# Patient Record
Sex: Male | Born: 1985 | Race: Black or African American | Hispanic: No | Marital: Single | Smoking: Never smoker
Health system: Southern US, Community
[De-identification: ages and names within clinical notes are randomized; demographics above are authoritative.]

## PROBLEM LIST (undated history)

## (undated) DIAGNOSIS — I509 Heart failure, unspecified: Secondary | ICD-10-CM

## (undated) DIAGNOSIS — G4730 Sleep apnea, unspecified: Secondary | ICD-10-CM

## (undated) DIAGNOSIS — I10 Essential (primary) hypertension: Secondary | ICD-10-CM

## (undated) DIAGNOSIS — R519 Headache, unspecified: Secondary | ICD-10-CM

## (undated) HISTORY — PX: WISDOM TOOTH EXTRACTION: SHX21

## (undated) HISTORY — DX: Sleep apnea, unspecified: G47.30

## (undated) HISTORY — PX: TONSILLECTOMY: SUR1361

## (undated) HISTORY — DX: Headache, unspecified: R51.9

## (undated) HISTORY — DX: Heart failure, unspecified: I50.9

---
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
## 2006-05-18 ENCOUNTER — Emergency Department: Payer: Self-pay | Admitting: Emergency Medicine

## 2006-05-18 ENCOUNTER — Other Ambulatory Visit: Payer: Self-pay

## 2006-12-05 ENCOUNTER — Emergency Department: Payer: Self-pay | Admitting: Emergency Medicine

## 2006-12-05 ENCOUNTER — Other Ambulatory Visit: Payer: Self-pay

## 2008-01-24 ENCOUNTER — Emergency Department: Payer: Self-pay | Admitting: Emergency Medicine

## 2008-06-18 ENCOUNTER — Emergency Department: Payer: Self-pay | Admitting: Emergency Medicine

## 2008-09-11 ENCOUNTER — Emergency Department: Payer: Self-pay | Admitting: Emergency Medicine

## 2008-11-26 ENCOUNTER — Emergency Department: Payer: Self-pay | Admitting: Emergency Medicine

## 2009-06-18 ENCOUNTER — Emergency Department: Payer: Self-pay | Admitting: Emergency Medicine

## 2009-11-17 ENCOUNTER — Emergency Department: Payer: Self-pay | Admitting: Emergency Medicine

## 2009-12-03 ENCOUNTER — Ambulatory Visit: Payer: Self-pay | Admitting: Otolaryngology

## 2010-01-17 ENCOUNTER — Ambulatory Visit: Payer: Self-pay | Admitting: Otolaryngology

## 2010-02-05 ENCOUNTER — Ambulatory Visit: Payer: Self-pay | Admitting: Otolaryngology

## 2010-02-07 LAB — PATHOLOGY REPORT

## 2010-06-07 ENCOUNTER — Emergency Department: Payer: Self-pay | Admitting: Emergency Medicine

## 2011-02-04 ENCOUNTER — Emergency Department: Payer: Self-pay | Admitting: *Deleted

## 2011-09-08 ENCOUNTER — Emergency Department: Payer: Self-pay | Admitting: Emergency Medicine

## 2011-09-08 LAB — TROPONIN I
Troponin-I: <0.02 ng/mL
Troponin-I: <0.02 ng/mL

## 2011-09-08 LAB — BASIC METABOLIC PANEL
Co2: 27 mmol/L (ref 21–32)
EGFR (African American): >60
EGFR (Non-African Amer.): >60
Glucose: 93 mg/dL (ref 65–99)
Potassium: 3.7 mmol/L (ref 3.5–5.1)
Sodium: 140 mmol/L (ref 136–145)

## 2011-09-08 LAB — CBC
MCH: 28.2 pg (ref 26.0–34.0)
MCV: 85 fL (ref 80–100)
RBC: 5.26 10*6/uL (ref 4.40–5.90)
RDW: 14.8 % — ABNORMAL HIGH (ref 11.5–14.5)

## 2013-06-10 ENCOUNTER — Emergency Department: Payer: Self-pay | Admitting: Emergency Medicine

## 2013-06-12 ENCOUNTER — Emergency Department: Payer: Self-pay | Admitting: Emergency Medicine

## 2014-01-18 ENCOUNTER — Emergency Department: Payer: Self-pay | Admitting: Emergency Medicine

## 2014-01-18 LAB — CBC WITH DIFFERENTIAL/PLATELET
BASOS PCT: 0.8 %
Basophil #: 0.1 10*3/uL (ref 0.0–0.1)
EOS PCT: 0.9 %
Eosinophil #: 0.1 10*3/uL (ref 0.0–0.7)
HCT: 48.2 % (ref 40.0–52.0)
HGB: 15.6 g/dL (ref 13.0–18.0)
Lymphocyte #: 3.6 10*3/uL (ref 1.0–3.6)
Lymphocyte %: 38.6 %
MCH: 27.1 pg (ref 26.0–34.0)
MCHC: 32.3 g/dL (ref 32.0–36.0)
MCV: 84 fL (ref 80–100)
MONO ABS: 0.6 x10 3/mm (ref 0.2–1.0)
MONOS PCT: 6.4 %
NEUTROS ABS: 5 10*3/uL (ref 1.4–6.5)
NEUTROS PCT: 53.3 %
Platelet: 167 10*3/uL (ref 150–440)
RBC: 5.74 10*6/uL (ref 4.40–5.90)
RDW: 14.8 % — ABNORMAL HIGH (ref 11.5–14.5)
WBC: 9.3 10*3/uL (ref 3.8–10.6)

## 2014-01-18 LAB — BASIC METABOLIC PANEL
Anion Gap: 9 (ref 7–16)
BUN: 12 mg/dL (ref 7–18)
Calcium, Total: 8.7 mg/dL (ref 8.5–10.1)
Chloride: 106 mmol/L (ref 98–107)
Co2: 26 mmol/L (ref 21–32)
Creatinine: 1.19 mg/dL (ref 0.60–1.30)
EGFR (African American): >60
EGFR (Non-African Amer.): >60
GLUCOSE: 86 mg/dL (ref 65–99)
OSMOLALITY: 280 (ref 275–301)
Potassium: 3.7 mmol/L (ref 3.5–5.1)
Sodium: 141 mmol/L (ref 136–145)

## 2014-10-07 ENCOUNTER — Emergency Department
Admit: 2014-10-07 | Disposition: A | Discharge status: Home / Self Care | Payer: Self-pay | Admitting: Emergency Medicine

## 2015-03-07 ENCOUNTER — Ambulatory Visit
Admission: EM | Admit: 2015-03-07 | Discharge: 2015-03-07 | Disposition: A | Discharge status: Home / Self Care | Payer: 59 | Attending: Family Medicine | Admitting: Family Medicine

## 2015-03-07 ENCOUNTER — Encounter: Payer: Self-pay | Admitting: Emergency Medicine

## 2015-03-07 DIAGNOSIS — K529 Noninfective gastroenteritis and colitis, unspecified: Secondary | ICD-10-CM | POA: Diagnosis not present

## 2015-03-07 HISTORY — DX: Essential (primary) hypertension: I10

## 2015-03-07 LAB — COMPREHENSIVE METABOLIC PANEL
ALT: 68 U/L — AB (ref 17–63)
AST: 37 U/L (ref 15–41)
Albumin: 4.3 g/dL (ref 3.5–5.0)
Alkaline Phosphatase: 46 U/L (ref 38–126)
Anion gap: 12 (ref 5–15)
BILIRUBIN TOTAL: 0.8 mg/dL (ref 0.3–1.2)
BUN: 13 mg/dL (ref 6–20)
CHLORIDE: 100 mmol/L — AB (ref 101–111)
CO2: 27 mmol/L (ref 22–32)
CREATININE: 1.13 mg/dL (ref 0.61–1.24)
Calcium: 8.8 mg/dL — ABNORMAL LOW (ref 8.9–10.3)
Glucose, Bld: 107 mg/dL — ABNORMAL HIGH (ref 65–99)
Potassium: 3.9 mmol/L (ref 3.5–5.1)
Sodium: 139 mmol/L (ref 135–145)
TOTAL PROTEIN: 7.8 g/dL (ref 6.5–8.1)

## 2015-03-07 LAB — URINALYSIS COMPLETE WITH MICROSCOPIC (ARMC ONLY)
Bacteria, UA: NONE SEEN — AB
Bilirubin Urine: NEGATIVE
Glucose, UA: NEGATIVE mg/dL
HGB URINE DIPSTICK: NEGATIVE
Ketones, ur: NEGATIVE mg/dL
Leukocytes, UA: NEGATIVE
NITRITE: NEGATIVE
PH: 6 (ref 5.0–8.0)
PROTEIN: NEGATIVE mg/dL
Specific Gravity, Urine: 1.02 (ref 1.005–1.030)

## 2015-03-07 LAB — CBC WITH DIFFERENTIAL/PLATELET
BASOS ABS: 0.1 10*3/uL (ref 0–0.1)
BASOS PCT: 1 %
EOS ABS: 0.1 10*3/uL (ref 0–0.7)
EOS PCT: 1 %
HCT: 46.7 % (ref 40.0–52.0)
HEMOGLOBIN: 15.6 g/dL (ref 13.0–18.0)
LYMPHS ABS: 1.3 10*3/uL (ref 1.0–3.6)
Lymphocytes Relative: 14 %
MCH: 27.6 pg (ref 26.0–34.0)
MCHC: 33.5 g/dL (ref 32.0–36.0)
MCV: 82.6 fL (ref 80.0–100.0)
Monocytes Absolute: 0.4 10*3/uL (ref 0.2–1.0)
Monocytes Relative: 4 %
NEUTROS PCT: 80 %
Neutro Abs: 7.1 10*3/uL — ABNORMAL HIGH (ref 1.4–6.5)
PLATELETS: 172 10*3/uL (ref 150–440)
RBC: 5.66 MIL/uL (ref 4.40–5.90)
RDW: 15.1 % — ABNORMAL HIGH (ref 11.5–14.5)
WBC: 8.9 10*3/uL (ref 3.8–10.6)

## 2015-03-07 MED ORDER — ONDANSETRON 8 MG PO TBDP: 8.0000 mg | ORAL_TABLET | Freq: Two times a day (BID) | ORAL | Status: DC

## 2015-03-07 MED ORDER — ONDANSETRON 8 MG PO TBDP
8.0000 mg | ORAL_TABLET | Freq: Once | ORAL | Status: AC
  Administered 2015-03-07: 8 mg via ORAL

## 2015-03-07 NOTE — ED Notes (Signed)
Patient c/o left sided abdominal pain that that started this morning. Patient reports vomiting and diarrhea.  Patient also reports right shoulder pain for a week.  Patient denies fevers.

## 2015-03-07 NOTE — Discharge Instructions (Signed)

## 2015-03-07 NOTE — ED Provider Notes (Signed)
CSN: [id number]     Arrival date & time 03/07/15  1059 History   First MD Initiated Contact with Patient 03/07/15 1158     Chief Complaint  Patient presents with  . Abdominal Pain  . Shoulder Pain    right   (Consider location/radiation/quality/duration/timing/severity/associated sxs/prior Treatment) HPI Comments: 29 yo male with a h/o acute left sided abdominal pain that started this morning associated with vomiting and loose stools. Denies any fevers, chills, melena, hematochezia. States he ate out fast food last night for dinner.  The history is provided by the patient.    Past Medical History  Diagnosis Date  . Hypertension    Past Surgical History  Procedure Laterality Date  . Tonsillectomy     History reviewed. No pertinent family history. Social History  Substance Use Topics  . Smoking status: Never Smoker   . Smokeless tobacco: None  . Alcohol Use: No    Review of Systems  Allergies  Review of patient's allergies indicates no known allergies.  Home Medications   Prior to Admission medications   Medication Sig Start Date End Date Taking? Authorizing Provider  lisinopril (PRINIVIL,ZESTRIL) 20 MG tablet Take 20 mg by mouth daily.   Yes Historical Provider, MD  ondansetron (ZOFRAN ODT) 8 MG disintegrating tablet Take 1 tablet (8 mg total) by mouth 2 (two) times daily. 03/07/15   Payton Mccallum, MD   Meds Ordered and Administered this Visit   Medications  ondansetron (ZOFRAN-ODT) disintegrating tablet 8 mg (8 mg Oral Given 03/07/15 1221)    BP 159/97 mmHg  Pulse 105  Temp(Src) 100.5 F (38.1 C) (Tympanic)  Resp 16  Ht  (1.727 m)  Wt 265 lb (120.203 kg)  BMI 40.30 kg/m2  SpO2 97% No data found.   Physical Exam  Constitutional: He appears well-developed and well-nourished. No distress.  HENT:  Head: Normocephalic.  Right Ear: Tympanic membrane and ear canal normal.  Left Ear: Tympanic membrane and ear canal normal.  Mouth/Throat: Uvula is midline  and mucous membranes are normal. No tonsillar abscesses.  Eyes: Conjunctivae are normal. No scleral icterus.  Neck: Normal range of motion. Neck supple. No tracheal deviation present. No thyromegaly present.  Cardiovascular: Normal rate, regular rhythm and normal heart sounds.   Pulmonary/Chest: Effort normal and breath sounds normal. No stridor. No respiratory distress. He has no wheezes. He has no rales. He exhibits no tenderness.  Abdominal: He exhibits no distension and no mass. There is tenderness (mild diffuse lower abdominal pain (left greater than right); no distenttion, rebound or guarding; abdomen soft). There is no rebound and no guarding.  Lymphadenopathy:    He has no cervical adenopathy.  Neurological: He is alert.  Skin: Skin is warm and dry. No rash noted. He is not diaphoretic.  Nursing note and vitals reviewed.   ED Course  Procedures (including critical care time)  Labs Review Labs Reviewed  URINALYSIS COMPLETEWITH MICROSCOPIC ([facility] ONLY) - Abnormal; Notable for the following:    Bacteria, UA NONE SEEN (*)    Squamous Epithelial / LPF 0-5 (*)    All other components within normal limits  COMPREHENSIVE METABOLIC PANEL - Abnormal; Notable for the following:    Chloride 100 (*)    Glucose, Bld 107 (*)    Calcium 8.8 (*)    ALT 68 (*)    All other components within normal limits  CBC WITH DIFFERENTIAL/PLATELET - Abnormal; Notable for the following:    RDW 15.1 (*)    Neutro Abs  7.1 (*)    All other components within normal limits    Imaging Review No results found.   Visual Acuity Review  Right Eye Distance:   Left Eye Distance:   Bilateral Distance:    Right Eye Near:   Left Eye Near:    Bilateral Near:         MDM   1. Acute gastroenteritis    Discharge Medication List as of 03/07/2015  1:48 PM    START taking these medications   Details  ondansetron (ZOFRAN ODT) 8 MG disintegrating tablet Take 1 tablet (8 mg total) by mouth 2 (two) times  daily., Starting 03/07/2015, Until Discontinued, Normal      Plan: 1. Test results (UA, CBC, CMP) and diagnosis reviewed with patient 2. Patient given zofran with improvement of symptoms 3.  rx as per orders; risks, benefits, potential side effects reviewed with patient 4. Recommend supportive treatment with clear liquid diet then advance slowly as tolerated; otc probiotics  5. F/u prn if symptoms worsen or don't improve    Payton Mccallum, MD 03/07/15 1427

## 2015-03-09 ENCOUNTER — Ambulatory Visit
Admit: 2015-03-09 | Discharge: 2015-03-09 | Disposition: A | Discharge status: Home / Self Care | Payer: 59 | Attending: *Deleted | Admitting: *Deleted

## 2015-03-09 ENCOUNTER — Encounter: Payer: Self-pay | Admitting: *Deleted

## 2015-03-09 ENCOUNTER — Ambulatory Visit
Admission: EM | Admit: 2015-03-09 | Discharge: 2015-03-09 | Disposition: A | Discharge status: Home / Self Care | Payer: 59 | Attending: Family Medicine | Admitting: Family Medicine

## 2015-03-09 DIAGNOSIS — R197 Diarrhea, unspecified: Secondary | ICD-10-CM

## 2015-03-09 DIAGNOSIS — I880 Nonspecific mesenteric lymphadenitis: Secondary | ICD-10-CM | POA: Diagnosis not present

## 2015-03-09 DIAGNOSIS — R599 Enlarged lymph nodes, unspecified: Secondary | ICD-10-CM | POA: Diagnosis not present

## 2015-03-09 DIAGNOSIS — R112 Nausea with vomiting, unspecified: Secondary | ICD-10-CM | POA: Diagnosis not present

## 2015-03-09 DIAGNOSIS — R1031 Right lower quadrant pain: Secondary | ICD-10-CM | POA: Diagnosis not present

## 2015-03-09 LAB — CBC WITH DIFFERENTIAL/PLATELET
BASOS PCT: 1 %
Basophils Absolute: 0 10*3/uL (ref 0–0.1)
Eosinophils Absolute: 0 10*3/uL (ref 0–0.7)
Eosinophils Relative: 0 %
HEMATOCRIT: 47.7 % (ref 40.0–52.0)
HEMOGLOBIN: 15.9 g/dL (ref 13.0–18.0)
LYMPHS ABS: 1.7 10*3/uL (ref 1.0–3.6)
Lymphocytes Relative: 22 %
MCH: 27.3 pg (ref 26.0–34.0)
MCHC: 33.3 g/dL (ref 32.0–36.0)
MCV: 82 fL (ref 80.0–100.0)
MONOS PCT: 9 %
Monocytes Absolute: 0.7 10*3/uL (ref 0.2–1.0)
NEUTROS ABS: 5.1 10*3/uL (ref 1.4–6.5)
NEUTROS PCT: 68 %
Platelets: 164 10*3/uL (ref 150–440)
RBC: 5.82 MIL/uL (ref 4.40–5.90)
RDW: 14.9 % — ABNORMAL HIGH (ref 11.5–14.5)
WBC: 7.5 10*3/uL (ref 3.8–10.6)

## 2015-03-09 LAB — URINALYSIS COMPLETE WITH MICROSCOPIC (ARMC ONLY)
Glucose, UA: NEGATIVE mg/dL
Hgb urine dipstick: NEGATIVE
Ketones, ur: NEGATIVE mg/dL
Leukocytes, UA: NEGATIVE
NITRITE: NEGATIVE
PH: 6 (ref 5.0–8.0)
PROTEIN: 30 mg/dL — AB
Specific Gravity, Urine: >1.03 — ABNORMAL HIGH (ref 1.005–1.030)

## 2015-03-09 LAB — COMPREHENSIVE METABOLIC PANEL
ALBUMIN: 4.4 g/dL (ref 3.5–5.0)
ALK PHOS: 49 U/L (ref 38–126)
ALT: 84 U/L — AB (ref 17–63)
ANION GAP: 12 (ref 5–15)
AST: 43 U/L — ABNORMAL HIGH (ref 15–41)
BILIRUBIN TOTAL: 0.7 mg/dL (ref 0.3–1.2)
BUN: 16 mg/dL (ref 6–20)
CALCIUM: 8.9 mg/dL (ref 8.9–10.3)
CO2: 23 mmol/L (ref 22–32)
CREATININE: 1.34 mg/dL — AB (ref 0.61–1.24)
Chloride: 102 mmol/L (ref 101–111)
GFR calc Af Amer: >60 mL/min (ref >60)
GFR calc non Af Amer: >60 mL/min (ref >60)
GLUCOSE: 101 mg/dL — AB (ref 65–99)
Potassium: 3.5 mmol/L (ref 3.5–5.1)
SODIUM: 137 mmol/L (ref 135–145)
TOTAL PROTEIN: 8.2 g/dL — AB (ref 6.5–8.1)

## 2015-03-09 LAB — LIPASE, BLOOD: Lipase: 15 U/L — ABNORMAL LOW (ref 22–51)

## 2015-03-09 MED ORDER — SODIUM CHLORIDE 0.9 % IV SOLN
Freq: Once | INTRAVENOUS | Status: AC
  Administered 2015-03-09: 13:00:00 via INTRAVENOUS

## 2015-03-09 MED ORDER — IOHEXOL 300 MG/ML  SOLN
100.0000 mL | Freq: Once | INTRAMUSCULAR | Status: AC | PRN
Start: 2015-03-09 — End: 2015-03-09
  Administered 2015-03-09: 100 mL via INTRAVENOUS

## 2015-03-09 MED ORDER — OXYCODONE-ACETAMINOPHEN 5-325 MG PO TABS: 1.0000 | ORAL_TABLET | Freq: Three times a day (TID) | ORAL | Status: DC | PRN

## 2015-03-09 MED ORDER — CIPROFLOXACIN HCL 500 MG PO TABS: 500.0000 mg | ORAL_TABLET | Freq: Two times a day (BID) | ORAL | Status: DC

## 2015-03-09 MED ORDER — ONDANSETRON 4 MG PO TBDP: 4.0000 mg | ORAL_TABLET | Freq: Three times a day (TID) | ORAL | Status: DC | PRN

## 2015-03-09 NOTE — ED Notes (Signed)
Patient has returned to the urgent care to get a 7 day work release note due to his inability to stand for long periods of time. Patient still has symptoms of lower abdominal pain radiating to right flank, diarrhea, and painful urination.

## 2015-03-09 NOTE — ED Provider Notes (Signed)
[facility] Emergency Department Provider Note  ____________________________________________  Time seen: Approximately 11:07 AM  I have reviewed the triage vital signs and the nursing notes.   HISTORY  Chief Complaint Follow-up; Abdominal Pain; Diarrhea; and Urinary Retention   HPI Benjamin Goodman. is a 29 y.o. male patient presents the urgent care for complaints of abdominal pain. Patient reports that he was seen in urgent care for the same complaint 2 days ago but states abdominal pain is worsened. States initially abdominal pain was on the left lower area and then radiated to the right but now reports that the pain is primarily right lower quadrant. States current abdominal pain is 7 out of 10.  Patient states he is still having some pain in the left lower abdomen but mostly right lower. As well as mild pain and right upper abdomen. Reports that he is continue to have diarrhea for the last 2 days. States diarrhea approximately 5-6 episodes a day. States that he has had nausea as well as 2 episodes of vomiting yesterday. Denies other vomiting. States he continues to drink fluids but states he has not eaten as he does not feel like eating as well as scared that he may vomit. States that he has drank at least 1 bottles of water today.Denies fever in the last 2 days. Denies back pain. Patient reports some pain this morning when he went to urinate, and states he feels like it was painful because moving his right leg increased abdominal pain. States once abdominal pain calmed down he was able to easily urinate.States since urinating well. Denies urinary hesitancy, urgency, stream changes or pain during urination.  Denies blood in stool or stool color changes or black in stool.  Patient states he also noticed today that it hurts to lift his right leg in his abdomen.  Patient does report that he works daily and has to frequently lift items but denies recent injury or  fall. Denies abdominal trauma. Denies neck or back pain. Denies penile pain, testicular pain, penile drainage or dysuria.   Past medical history is positive for hypertension much patient is taking lisinopril and hydrochlorothiazide. Denies other home medications. Denies his medication changes. Denies recent cough cold congestion symptoms.   Past Medical History  Diagnosis Date  . Hypertension     There are no active problems to display for this patient.   Past Surgical History  Procedure Laterality Date  . Tonsillectomy      Current Outpatient Rx  Name  Route  Sig  Dispense  Refill  . lisinopril (PRINIVIL,ZESTRIL) 20 MG tablet   Oral   Take 20 mg by mouth daily.         . ondansetron (ZOFRAN ODT) 8 MG disintegrating tablet   Oral   Take 1 tablet (8 mg total) by mouth 2 (two) times daily.   6 tablet   0   .           Marland Kitchen             Allergies Review of patient's allergies indicates no known allergies.  History reviewed. No pertinent family history.  Social History Social History  Substance Use Topics  . Smoking status: Never Smoker   . Smokeless tobacco: None  . Alcohol Use: No    Review of Systems Constitutional: No fever/chills Eyes: No visual changes. ENT: No sore throat. Cardiovascular: Denies chest pain. Respiratory: Denies shortness of breath. Gastrointestinal: Positive for abdominal pain, nausea, vomiting, diarrhea as  above. No constipation. Genitourinary: Negative for dysuria. Musculoskeletal: Negative for back pain. Skin: Negative for rash. Neurological: Negative for headaches, focal weakness or numbness.  10-point ROS otherwise negative.  ____________________________________________   PHYSICAL EXAM:  VITAL SIGNS: ED Triage Vitals  Enc Vitals Group     BP 03/09/15 1033 157/106 mmHg     Pulse Rate 03/09/15 1033 96     Resp 03/09/15 1033 18     Temp 03/09/15 1033 98.4 F (36.9 C)     Temp Source 03/09/15 1033 Oral     SpO2 03/09/15 1033  98 %     Weight 03/09/15 1033 259 lb (117.482 kg)     Height 03/09/15 1033  (1.727 m)     Head Cir --      Peak Flow --      Pain Score 03/09/15 1037 7     Pain Loc --      Pain Edu? --      Excl. in GC? --    Recheck vitals: 88, 18, 145/84, 99% O2, 98.39F orally   Constitutional: Alert and oriented. Well appearing and in no acute distress. Eyes: Conjunctivae are normal. PERRL. EOMI. Head: Atraumatic.  Ears: no erythema, normal TMs bilaterally.   Nose: No congestion/rhinnorhea.  Mouth/Throat: Mucous membranes are moist.  Oropharynx non-erythematous. Neck: No stridor.  No cervical spine tenderness to palpation. Hematological/Lymphatic/Immunilogical: No cervical lymphadenopathy. Cardiovascular: Normal rate, regular rhythm. Grossly normal heart sounds.  Good peripheral circulation. Respiratory: Normal respiratory effort.  No retractions. Lungs CTAB. Gastrointestinal: Soft. Right lower quadrant mod TTP, LLQ mild TTP, RUQ mild TTP, negative murphy's sign, positive rovsing, positive obturator sign.  No distention. Normal Bowel sounds.  No abdominal bruits. No CVA tenderness. Prostate: Prostate exam completed with RN Jeannett Senior at bedside. Prostate exam with patient in standing position. Prostate nontender. However noted tenderness present when palpated towards the right. No other tenderness noted.  Musculoskeletal: No lower or upper extremity tenderness nor edema.  No joint effusions. Bilateral pedal pulses equal and easily palpated.  Neurologic:  Normal speech and language. No gross focal neurologic deficits are appreciated. No gait instability. Skin:  Skin is warm, dry and intact. No rash noted. Psychiatric: Mood and affect are normal. Speech and behavior are normal.  ____________________________________________   LABS (all labs ordered are listed, but only abnormal results are displayed)  Labs Reviewed  CBC WITH DIFFERENTIAL/PLATELET - Abnormal; Notable for the following:    RDW  14.9 (*)    All other components within normal limits  COMPREHENSIVE METABOLIC PANEL - Abnormal; Notable for the following:    Glucose, Bld 101 (*)    Creatinine, Ser 1.34 (*)    Total Protein 8.2 (*)    AST 43 (*)    ALT 84 (*)    All other components within normal limits  LIPASE, BLOOD - Abnormal; Notable for the following:    Lipase 15 (*)    All other components within normal limits  URINALYSIS COMPLETEWITH MICROSCOPIC ([facility] ONLY) - Abnormal; Notable for the following:    Bilirubin Urine 1+ (*)    Specific Gravity, Urine >1.030 (*)    Protein, ur 30 (*)    Squamous Epithelial / LPF 0-5 (*)    All other components within normal limits  URINE CULTURE      INITIAL IMPRESSION / ASSESSMENT AND PLAN / ED COURSE  Pertinent labs & imaging results that were available during my care of the patient were reviewed by me and considered in my  medical decision making (see chart for details).  Well-appearing patient. No acute distress. Presents to urgent care for continued abdominal pain 2 days. Patient moderate tenderness right lower quadrant, mild tenderness left lower quadrant. Positive rovsing and obturator signs.  Labs reviewed. Creatinine 2 days ago 1.13, creatinine today 1.34. Patient does report he is still drinking fluids. As patient with continued abdominal pain concerning for appendicitis will evaluate by CT abdomen and pelvis with contrast. And also as patient with mild increased creatinine will administer normal saline IV in urgent care and counseled oral hydration pending CT abdomen results. No diarrhea or vomiting while in urgent care.   As no current CT ability at this facility at this time, Will send patient to [facility] regional for CT abdomen and pelvis. Patient verbalized understanding and agreed to this plan. Counseled to remain nothing by mouth until further directed.Discussed follow up with Primary care physician this week pending Ct results.. Discussed follow up and return  parameters including no resolution or any worsening concerns. Patient verbalized understanding and agreed to plan.   Report given to Dr Thurmond Butts to follow up regarding patient CT scan. Dr Thurmond Butts to follow up with CT abdomen and pelvis results. Rx for prn zofran and percocet #9 given. Work note for today and tomorrow also given.   ____________________________________________   FINAL CLINICAL IMPRESSION(S) / ED DIAGNOSES  Final diagnoses:  Right lower quadrant abdominal pain  Diarrhea  Non-intractable vomiting with nausea, vomiting of unspecified type       Renford Dills, NP 03/09/15 1334  Renford Dills, NP 03/09/15 1336

## 2015-03-09 NOTE — ED Notes (Signed)
CT scan of the abdomen/pelvis with contrast scheduled for 2:00pm at [facility].  Patient instructed to be there and check in at the [facility] Registration desk [facility] at 1:30pm.

## 2015-03-09 NOTE — ED Provider Notes (Signed)
CSN: [id number]     Arrival date & time 03/09/15  6962 History   First MD Initiated Contact with Patient 03/09/15 1049     Chief Complaint  Patient presents with  . Follow-up  . Abdominal Pain    lower radiating to right side  . Diarrhea  . Urinary Retention    burning   (Consider location/radiation/quality/duration/timing/severity/associated sxs/prior Treatment) HPI  Past Medical History  Diagnosis Date  . Hypertension    Past Surgical History  Procedure Laterality Date  . Tonsillectomy     History reviewed. No pertinent family history. Social History  Substance Use Topics  . Smoking status: Never Smoker   . Smokeless tobacco: None  . Alcohol Use: No    Review of Systems  Allergies  Review of patient's allergies indicates no known allergies.  Home Medications   Prior to Admission medications   Medication Sig Start Date End Date Taking? Authorizing Provider  lisinopril (PRINIVIL,ZESTRIL) 20 MG tablet Take 20 mg by mouth daily.   Yes Historical Provider, MD  ondansetron (ZOFRAN ODT) 8 MG disintegrating tablet Take 1 tablet (8 mg total) by mouth 2 (two) times daily. 03/07/15  Yes Payton Mccallum, MD  ondansetron (ZOFRAN ODT) 4 MG disintegrating tablet Take 1 tablet (4 mg total) by mouth every 8 (eight) hours as needed for nausea or vomiting. 03/09/15   Renford Dills, NP  oxyCODONE-acetaminophen (ROXICET) 5-325 MG tablet Take 1 tablet by mouth every 8 (eight) hours as needed for moderate pain or severe pain (Do not drive or operate heavy machinery while taking as can cause drowsiness.). 03/09/15   Renford Dills, NP   Meds Ordered and Administered this Visit   Medications  0.9 %  sodium chloride infusion ( Intravenous Stopped 03/09/15 1318)    BP 140/80 mmHg  Pulse 87  Temp(Src) 98.4 F (36.9 C) (Oral)  Resp 16  Ht  (1.727 m)  Wt 259 lb (117.482 kg)  BMI 39.39 kg/m2  SpO2 98% No data found.   Physical Exam  ED Course  Procedures (including critical care  time)  Labs Review Labs Reviewed  CBC WITH DIFFERENTIAL/PLATELET - Abnormal; Notable for the following:    RDW 14.9 (*)    All other components within normal limits  COMPREHENSIVE METABOLIC PANEL - Abnormal; Notable for the following:    Glucose, Bld 101 (*)    Creatinine, Ser 1.34 (*)    Total Protein 8.2 (*)    AST 43 (*)    ALT 84 (*)    All other components within normal limits  LIPASE, BLOOD - Abnormal; Notable for the following:    Lipase 15 (*)    All other components within normal limits  URINALYSIS COMPLETEWITH MICROSCOPIC ([facility] ONLY) - Abnormal; Notable for the following:    Bilirubin Urine 1+ (*)    Specific Gravity, Urine >1.030 (*)    Protein, ur 30 (*)    Squamous Epithelial / LPF 0-5 (*)    All other components within normal limits  URINE CULTURE    Imaging Review Ct Abdomen Pelvis W Contrast  03/09/2015   CLINICAL DATA:  29 year old male with right lower quadrant abdominal pain for the past 3 days. Nausea, vomiting and diarrhea.  EXAM: CT ABDOMEN AND PELVIS WITH CONTRAST  TECHNIQUE: Multidetector CT imaging of the abdomen and pelvis was performed using the standard protocol following bolus administration of intravenous contrast.  CONTRAST:  OMNIPAQUE IOHEXOL 300 MG/ML  SOLN  COMPARISON:  CT the abdomen pelvis 06/08/2010.  FINDINGS: Lower chest: 4 mm subpleural nodule in the periphery of the lateral segment of the right middle lobe (image 6 of series 4).  Hepatobiliary: No cystic or solid hepatic lesions. No intra or extrahepatic biliary ductal dilatation. Gallbladder is normal in appearance.  Pancreas: No pancreatic mass. No pancreatic ductal dilatation. No pancreatic or peripancreatic fluid or inflammatory changes.  Spleen: Unremarkable.  Adrenals/Urinary Tract: Bilateral kidneys and bilateral adrenal glands are normal in appearance. No hydroureteronephrosis. Urinary bladder is normal in appearance.  Stomach/Bowel: Appearance of the stomach is normal. No pathologic  dilatation of small bowel or colon. Normal appendix. Possible mild mural thickening of the terminal ileum, however, the lumen does not appear narrowed. There are no surrounding inflammatory changes. However, there are multiple prominent but nonenlarged ileocolic lymph nodes which are conspicuous by their number (these measure only up to 8 mm in short axis).  Vascular/Lymphatic: No significant atherosclerotic disease, aneurysm or dissection identified in the abdominal or pelvic vasculature. No lymphadenopathy noted in the abdomen or pelvis. Multiple prominent but nonenlarged ileocolic lymph nodes, as above.  Reproductive: Prostate gland and seminal vesicles are unremarkable in appearance.  Other: No significant volume of ascites.  No pneumoperitoneum.  Musculoskeletal: There are no aggressive appearing lytic or blastic lesions noted in the visualized portions of the skeleton.  IMPRESSION: 1. Normal appendix. 2. Multiple borderline enlarged ileocolic lymph nodes, suggestive of potential mesenteric adenitis. There does appear to be some potential mild mural thickening of the ileum, particularly in the region of the terminal ileum, however, the lumen appears preserved without definite luminal narrowing.   Electronically Signed   By: Trudie Reed M.D.   On: 03/09/2015 14:41     Visual Acuity Review  Right Eye Distance:   Left Eye Distance:   Bilateral Distance:    Right Eye Near:   Left Eye Near:    Bilateral Near:         MDM   1. Right lower quadrant abdominal pain   2. Diarrhea   3. Non-intractable vomiting with nausea, vomiting of unspecified type       Patient was notified of the CT scan reports. Report was called to Dr. Thurmond Butts since Renford Dills was a longer available. Explained the patient that the CT scan looked like mesenteric adenitis which is usually a self-limiting illness and using a bowel infection-viral in nature. Since this has been going on for about 2-3 days worse he has  only been given pain medication I will place him on Cipro for 7 days since Yersinia infection can sometimes be a problem. Explained that this will usually improve within 7-10 days. Patient had talked to the nurse about work note and nurse practitioner Hyacinth Meeker and I reiterated to him when he asked me that we can only give 2 days maximum and that he needs long-term note for work he really should be seeing his primary care physician for this problem. If he is not better by Monday and was returned he may.  Hassan Rowan, MD 03/09/15 [phone]

## 2015-03-09 NOTE — Discharge Instructions (Signed)
Go directly to [facility] for Ct scan of abdomen as directed. This is very important.   Do not eat or drink until cleared to do so. Follow up closely with your primary care physician this week. Return to Urgent care for new or worsening concerns.   Abdominal Pain Many things can cause abdominal pain. Usually, abdominal pain is not caused by a disease and will improve without treatment. It can often be observed and treated at home. Your health care provider will do a physical exam and possibly order blood tests and X-rays to help determine the seriousness of your pain. However, in many cases, more time must pass before a clear cause of the pain can be found. Before that point, your health care provider may not know if you need more testing or further treatment. HOME CARE INSTRUCTIONS  Monitor your abdominal pain for any changes. The following actions may help to alleviate any discomfort you are experiencing:  Only take over-the-counter or prescription medicines as directed by your health care provider.  Do not take laxatives unless directed to do so by your health care provider.  Try a clear liquid diet (broth, tea, or water) as directed by your health care provider. Slowly move to a bland diet as tolerated. SEEK MEDICAL CARE IF:  You have unexplained abdominal pain.  You have abdominal pain associated with nausea or diarrhea.  You have pain when you urinate or have a bowel movement.  You experience abdominal pain that wakes you in the night.  You have abdominal pain that is worsened or improved by eating food.  You have abdominal pain that is worsened with eating fatty foods.  You have a fever. SEEK IMMEDIATE MEDICAL CARE IF:   Your pain does not go away within 2 hours.  You keep throwing up (vomiting).  Your pain is felt only in portions of the abdomen, such as the right side or the left lower portion of the abdomen.  You pass bloody or black tarry stools. MAKE SURE  YOU:  Understand these instructions.   Will watch your condition.   Will get help right away if you are not doing well or get worse.  Document Released: 03/06/2005 Document Revised: 06/01/2013 Document Reviewed: 02/03/2013 [facility] Patient Information 2015 [place], [place]. This information is not intended to replace advice given to you by your health care provider. Make sure you discuss any questions you have with your health care provider. Mesenteric Adenitis Mesenteric adenitis is an inflammation of lymph nodes (glands) in the abdomen. It may appear to mimic appendicitis symptoms. It is most common in children. The cause of this may be an infection somewhere else in the body. It usually gets well without treatment but can cause problems for up to a couple weeks. SYMPTOMS  The most common problems are:  Fever.  Abdominal pain and tenderness.  Nausea, vomiting, and/or diarrhea. DIAGNOSIS  Your caregiver may have an idea what is wrong by examining you or your child. Sometimes lab work and other studies such as Ultrasonography and a CT scan of the abdomen are done.  TREATMENT  Children with mesenteric adenitis will get well without further treatment. Treatment includes rest, pain medications, and fluids. HOME CARE INSTRUCTIONS   Do not take or give laxatives unless ordered by your caregiver.  Use pain medications as directed.  Follow the diet recommended by your caregiver. SEEK IMMEDIATE MEDICAL CARE IF:   The pain does not go away or becomes severe.  An oral temperature above  102 F (38.9 C) develops.  Repeated vomiting occurs.  The pain becomes localized in the right lower quadrant of the abdomen (possibly appendicitis).  You or your child notice bright red or black tarry stools. MAKE SURE YOU:   Understand these instructions.  Will watch your condition.  Will get help right away if you are not doing well or get worse. Document Released: 02/28/2006 Document  Revised: 08/19/2011 Document Reviewed: 09/01/2013 [facility] Patient Information 2015 [place], [place]. This information is not intended to replace advice given to you by your health care provider. Make sure you discuss any questions you have with your health care provider.

## 2015-10-27 ENCOUNTER — Encounter: Payer: Self-pay | Admitting: Emergency Medicine

## 2015-10-27 ENCOUNTER — Ambulatory Visit
Admission: EM | Admit: 2015-10-27 | Discharge: 2015-10-27 | Disposition: A | Discharge status: Home / Self Care | Payer: 59 | Attending: Family Medicine | Admitting: Family Medicine

## 2015-10-27 DIAGNOSIS — B353 Tinea pedis: Secondary | ICD-10-CM | POA: Diagnosis not present

## 2015-10-27 DIAGNOSIS — B351 Tinea unguium: Secondary | ICD-10-CM | POA: Diagnosis not present

## 2015-10-27 MED ORDER — FLUCONAZOLE 150 MG PO TABS: ORAL_TABLET | ORAL | Status: DC

## 2015-10-27 NOTE — ED Notes (Signed)
Right foot pain, skin peeling off, bleeding for 2 weeks

## 2015-10-27 NOTE — ED Provider Notes (Signed)
CSN: [id number]     Arrival date & time 10/27/15  1617 History   First MD Initiated Contact with Patient 10/27/15 1719     Chief Complaint  Patient presents with  . Foot Pain   (Consider location/radiation/quality/duration/timing/severity/associated sxs/prior Treatment) HPI Comments: 30 yo male with a 2 week h/o right foot rash associated with itching and pain. Denies any drainage, exposure to plants, injury. States has toenail fungus as well.   Patient is a 30 y.o. male presenting with lower extremity pain. The history is provided by the patient.  Foot Pain    Past Medical History  Diagnosis Date  . Hypertension    Past Surgical History  Procedure Laterality Date  . Tonsillectomy     No family history on file. Social History  Substance Use Topics  . Smoking status: Never Smoker   . Smokeless tobacco: None  . Alcohol Use: No    Review of Systems  Allergies  Review of patient's allergies indicates no known allergies.  Home Medications   Prior to Admission medications   Medication Sig Start Date End Date Taking? Authorizing Provider  ciprofloxacin (CIPRO) 500 MG tablet Take 1 tablet (500 mg total) by mouth 2 (two) times daily. 03/09/15   Hassan RowanEugene Wade, MD  fluconazole (DIFLUCAN) 150 MG tablet Take 1 tablet po once, then repeat in 1 week 10/27/15   Payton Mccallumrlando Nyomie Ehrlich, MD  lisinopril (PRINIVIL,ZESTRIL) 20 MG tablet Take 20 mg by mouth daily.    Historical Provider, MD  ondansetron (ZOFRAN ODT) 4 MG disintegrating tablet Take 1 tablet (4 mg total) by mouth every 8 (eight) hours as needed for nausea or vomiting. 03/09/15   Renford DillsLindsey Miller, NP  ondansetron (ZOFRAN ODT) 8 MG disintegrating tablet Take 1 tablet (8 mg total) by mouth 2 (two) times daily. 03/07/15   Payton Mccallumrlando Kerman Pfost, MD  oxyCODONE-acetaminophen (ROXICET) 5-325 MG tablet Take 1 tablet by mouth every 8 (eight) hours as needed for moderate pain or severe pain (Do not drive or operate heavy machinery while taking as can cause  drowsiness.). 03/09/15   Renford DillsLindsey Miller, NP   Meds Ordered and Administered this Visit  Medications - No data to display  BP 138/82 mmHg  Pulse 76  Temp(Src) 97.9 F (36.6 C) (Tympanic)  Resp 18  Ht 5\' 8"  (1.727 m)  Wt 260 lb (117.935 kg)  BMI 39.54 kg/m2  SpO2 100% No data found.   Physical Exam  Skin:  Scaly, erythematous rash on dorsum of foot and between the toes, with few excoriations; diffuse thickened, discolored toenails    ED Course  Procedures (including critical care time)  Labs Review Labs Reviewed - No data to display  Imaging Review No results found.   Visual Acuity Review  Right Eye Distance:   Left Eye Distance:   Bilateral Distance:    Right Eye Near:   Left Eye Near:    Bilateral Near:         MDM   1. Tinea pedis of right foot   2. Onychomycosis    Discharge Medication List as of 10/27/2015  5:42 PM    START taking these medications   Details  fluconazole (DIFLUCAN) 150 MG tablet Take 1 tablet po once, then repeat in 1 week, Normal        1. diagnosis reviewed with patient 2. rx as per orders above; reviewed possible side effects, interactions, risks and benefits  3. Recommend supportive treatment with otc antifungal cream bid 4. Follow-up prn if symptoms worsen or  don't improve  Payton Mccallum, MD 10/27/15 [phone]

## 2016-12-20 DIAGNOSIS — H10413 Chronic giant papillary conjunctivitis, bilateral: Secondary | ICD-10-CM | POA: Diagnosis not present

## 2016-12-20 DIAGNOSIS — H16403 Unspecified corneal neovascularization, bilateral: Secondary | ICD-10-CM | POA: Diagnosis not present

## 2017-01-21 DIAGNOSIS — J181 Lobar pneumonia, unspecified organism: Secondary | ICD-10-CM | POA: Diagnosis not present

## 2017-01-21 DIAGNOSIS — R05 Cough: Secondary | ICD-10-CM | POA: Diagnosis not present

## 2017-01-23 ENCOUNTER — Encounter: Payer: Self-pay | Admitting: Emergency Medicine

## 2017-01-23 ENCOUNTER — Inpatient Hospital Stay
Admission: EM | Admit: 2017-01-23 | Discharge: 2017-01-27 | DRG: 871 | Disposition: A | Discharge status: Home / Self Care | Payer: 59 | Attending: Internal Medicine | Admitting: Internal Medicine

## 2017-01-23 ENCOUNTER — Emergency Department: Payer: 59

## 2017-01-23 DIAGNOSIS — R0602 Shortness of breath: Secondary | ICD-10-CM | POA: Diagnosis not present

## 2017-01-23 DIAGNOSIS — Z9119 Patient's noncompliance with other medical treatment and regimen: Secondary | ICD-10-CM

## 2017-01-23 DIAGNOSIS — Z6841 Body Mass Index (BMI) 40.0 and over, adult: Secondary | ICD-10-CM | POA: Diagnosis not present

## 2017-01-23 DIAGNOSIS — J189 Pneumonia, unspecified organism: Secondary | ICD-10-CM | POA: Diagnosis not present

## 2017-01-23 DIAGNOSIS — I248 Other forms of acute ischemic heart disease: Secondary | ICD-10-CM | POA: Diagnosis present

## 2017-01-23 DIAGNOSIS — G4733 Obstructive sleep apnea (adult) (pediatric): Secondary | ICD-10-CM | POA: Diagnosis present

## 2017-01-23 DIAGNOSIS — J9601 Acute respiratory failure with hypoxia: Secondary | ICD-10-CM | POA: Diagnosis present

## 2017-01-23 DIAGNOSIS — R008 Other abnormalities of heart beat: Secondary | ICD-10-CM | POA: Diagnosis not present

## 2017-01-23 DIAGNOSIS — K920 Hematemesis: Secondary | ICD-10-CM | POA: Diagnosis present

## 2017-01-23 DIAGNOSIS — Z8249 Family history of ischemic heart disease and other diseases of the circulatory system: Secondary | ICD-10-CM | POA: Diagnosis not present

## 2017-01-23 DIAGNOSIS — R042 Hemoptysis: Secondary | ICD-10-CM | POA: Diagnosis not present

## 2017-01-23 DIAGNOSIS — Z79891 Long term (current) use of opiate analgesic: Secondary | ICD-10-CM

## 2017-01-23 DIAGNOSIS — A419 Sepsis, unspecified organism: Secondary | ICD-10-CM | POA: Diagnosis not present

## 2017-01-23 DIAGNOSIS — Z79899 Other long term (current) drug therapy: Secondary | ICD-10-CM

## 2017-01-23 DIAGNOSIS — I10 Essential (primary) hypertension: Secondary | ICD-10-CM | POA: Diagnosis present

## 2017-01-23 DIAGNOSIS — R748 Abnormal levels of other serum enzymes: Secondary | ICD-10-CM | POA: Diagnosis not present

## 2017-01-23 DIAGNOSIS — R0902 Hypoxemia: Secondary | ICD-10-CM | POA: Diagnosis not present

## 2017-01-23 DIAGNOSIS — R079 Chest pain, unspecified: Secondary | ICD-10-CM | POA: Diagnosis not present

## 2017-01-23 LAB — TROPONIN I
TROPONIN I: 0.03 ng/mL — AB (ref <0.03)
TROPONIN I: 0.04 ng/mL — AB (ref <0.03)
Troponin I: 0.04 ng/mL (ref <0.03)

## 2017-01-23 LAB — BASIC METABOLIC PANEL
Anion gap: 9 (ref 5–15)
BUN: 22 mg/dL — AB (ref 6–20)
CALCIUM: 8.8 mg/dL — AB (ref 8.9–10.3)
CO2: 29 mmol/L (ref 22–32)
CREATININE: 1.34 mg/dL — AB (ref 0.61–1.24)
Chloride: 101 mmol/L (ref 101–111)
Glucose, Bld: 101 mg/dL — ABNORMAL HIGH (ref 65–99)
Potassium: 3 mmol/L — ABNORMAL LOW (ref 3.5–5.1)
SODIUM: 139 mmol/L (ref 135–145)

## 2017-01-23 LAB — CBC
HCT: 44 % (ref 40.0–52.0)
Hemoglobin: 15 g/dL (ref 13.0–18.0)
MCH: 27.3 pg (ref 26.0–34.0)
MCHC: 34.1 g/dL (ref 32.0–36.0)
MCV: 80.2 fL (ref 80.0–100.0)
PLATELETS: 152 10*3/uL (ref 150–440)
RBC: 5.48 MIL/uL (ref 4.40–5.90)
RDW: 15.2 % — AB (ref 11.5–14.5)
WBC: 5.8 10*3/uL (ref 3.8–10.6)

## 2017-01-23 LAB — URINALYSIS, ROUTINE W REFLEX MICROSCOPIC
BACTERIA UA: NONE SEEN
Bilirubin Urine: NEGATIVE
Glucose, UA: NEGATIVE mg/dL
Hgb urine dipstick: NEGATIVE
KETONES UR: NEGATIVE mg/dL
LEUKOCYTES UA: NEGATIVE
Nitrite: NEGATIVE
PH: 7 (ref 5.0–8.0)
PROTEIN: 30 mg/dL — AB
Specific Gravity, Urine: >1.046 — ABNORMAL HIGH (ref 1.005–1.030)

## 2017-01-23 LAB — LACTIC ACID, PLASMA: Lactic Acid, Venous: 1.6 mmol/L (ref 0.5–1.9)

## 2017-01-23 LAB — TYPE AND SCREEN
ABO/RH(D): O POS
ANTIBODY SCREEN: NEGATIVE

## 2017-01-23 MED ORDER — METOPROLOL TARTRATE 5 MG/5ML IV SOLN
5.0000 mg | Freq: Once | INTRAVENOUS | Status: AC
  Administered 2017-01-23: 5 mg via INTRAVENOUS
  Filled 2017-01-23: qty 5

## 2017-01-23 MED ORDER — NITROGLYCERIN 2 % TD OINT
0.5000 [in_us] | TOPICAL_OINTMENT | Freq: Four times a day (QID) | TRANSDERMAL | Status: DC
  Administered 2017-01-23 – 2017-01-25 (×6): 0.5 [in_us] via TOPICAL
  Filled 2017-01-23 (×6): qty 1

## 2017-01-23 MED ORDER — AZITHROMYCIN 500 MG IV SOLR
500.0000 mg | INTRAVENOUS | Status: DC
  Administered 2017-01-24 – 2017-01-25 (×2): 500 mg via INTRAVENOUS
  Filled 2017-01-23 (×3): qty 500

## 2017-01-23 MED ORDER — OXYCODONE-ACETAMINOPHEN 5-325 MG PO TABS
1.0000 | ORAL_TABLET | Freq: Four times a day (QID) | ORAL | Status: DC | PRN
  Administered 2017-01-23 – 2017-01-26 (×8): 1 via ORAL
  Filled 2017-01-23 (×8): qty 1

## 2017-01-23 MED ORDER — ONDANSETRON HCL 4 MG PO TABS
4.0000 mg | ORAL_TABLET | Freq: Four times a day (QID) | ORAL | Status: DC | PRN
  Administered 2017-01-23: 4 mg via ORAL
  Filled 2017-01-23: qty 1

## 2017-01-23 MED ORDER — CEFTRIAXONE SODIUM 1 G IJ SOLR
1.0000 g | INTRAMUSCULAR | Status: DC
  Administered 2017-01-24 – 2017-01-26 (×3): 1 g via INTRAVENOUS
  Filled 2017-01-23 (×4): qty 10

## 2017-01-23 MED ORDER — ONDANSETRON HCL 4 MG/2ML IJ SOLN: 4.0000 mg | Freq: Four times a day (QID) | INTRAMUSCULAR | Status: DC | PRN

## 2017-01-23 MED ORDER — IOPAMIDOL (ISOVUE-370) INJECTION 76%
100.0000 mL | Freq: Once | INTRAVENOUS | Status: AC | PRN
  Administered 2017-01-23: 100 mL via INTRAVENOUS

## 2017-01-23 MED ORDER — METOPROLOL TARTRATE 5 MG/5ML IV SOLN: 2.5000 mg | Freq: Three times a day (TID) | INTRAVENOUS | Status: DC | PRN

## 2017-01-23 MED ORDER — DEXTROSE 5 % IV SOLN
1.0000 g | Freq: Once | INTRAVENOUS | Status: AC
  Administered 2017-01-23: 1 g via INTRAVENOUS
  Filled 2017-01-23: qty 10

## 2017-01-23 MED ORDER — POTASSIUM CHLORIDE 10 MEQ/100ML IV SOLN
10.0000 meq | INTRAVENOUS | Status: AC
  Administered 2017-01-23 (×3): 10 meq via INTRAVENOUS
  Filled 2017-01-23 (×4): qty 100

## 2017-01-23 MED ORDER — HYDRALAZINE HCL 50 MG PO TABS
25.0000 mg | ORAL_TABLET | Freq: Three times a day (TID) | ORAL | Status: DC
  Administered 2017-01-23 – 2017-01-26 (×8): 25 mg via ORAL
  Filled 2017-01-23: qty 1
  Filled 2017-01-23: qty 0.5
  Filled 2017-01-23 (×5): qty 1

## 2017-01-23 MED ORDER — BENZONATATE 100 MG PO CAPS
200.0000 mg | ORAL_CAPSULE | Freq: Three times a day (TID) | ORAL | Status: DC | PRN
  Filled 2017-01-23: qty 2

## 2017-01-23 MED ORDER — HEPARIN SODIUM (PORCINE) 5000 UNIT/ML IJ SOLN
5000.0000 [IU] | Freq: Three times a day (TID) | INTRAMUSCULAR | Status: DC
  Administered 2017-01-23 – 2017-01-26 (×8): 5000 [IU] via SUBCUTANEOUS
  Filled 2017-01-23 (×7): qty 1

## 2017-01-23 MED ORDER — DEXTROSE 5 % IV SOLN
500.0000 mg | Freq: Once | INTRAVENOUS | Status: AC
  Administered 2017-01-23: 500 mg via INTRAVENOUS
  Filled 2017-01-23: qty 500

## 2017-01-23 MED ORDER — SODIUM CHLORIDE 0.9 % IV BOLUS (SEPSIS)
1000.0000 mL | Freq: Once | INTRAVENOUS | Status: AC
  Administered 2017-01-23: 1000 mL via INTRAVENOUS

## 2017-01-23 MED ORDER — NITROGLYCERIN 0.4 MG SL SUBL
0.4000 mg | SUBLINGUAL_TABLET | SUBLINGUAL | Status: DC | PRN
  Administered 2017-01-23: 0.4 mg via SUBLINGUAL
  Filled 2017-01-23 (×2): qty 1

## 2017-01-23 MED ORDER — SODIUM CHLORIDE 0.9 % IV SOLN
INTRAVENOUS | Status: DC
  Administered 2017-01-23 – 2017-01-25 (×3): via INTRAVENOUS

## 2017-01-23 MED ORDER — METOPROLOL TARTRATE 25 MG PO TABS
25.0000 mg | ORAL_TABLET | Freq: Two times a day (BID) | ORAL | Status: DC
  Administered 2017-01-23 – 2017-01-27 (×9): 25 mg via ORAL
  Filled 2017-01-23 (×8): qty 1

## 2017-01-23 MED ORDER — ACETAMINOPHEN 325 MG PO TABS: 650.0000 mg | ORAL_TABLET | Freq: Four times a day (QID) | ORAL | Status: DC | PRN

## 2017-01-23 MED ORDER — ACETAMINOPHEN 650 MG RE SUPP: 650.0000 mg | Freq: Four times a day (QID) | RECTAL | Status: DC | PRN

## 2017-01-23 NOTE — ED Notes (Signed)
Dr. Lenard LancePaduchowski notified of critical trop

## 2017-01-23 NOTE — Progress Notes (Signed)
Pharmacy Antibiotic Note  Benjamin BalesClayton G Nasworthy Jr. is a 31 y.o. male admitted on 01/23/2017 with pneumonia.  Pharmacy has been consulted for azithromycin and ceftriaxone dosing.  Plan: azithromycin 500mg  iv q24h & ceftriaxone 1gm iv q24h   Height: 5\' 6"  (167.6 cm) Weight: 270 lb (122.5 kg) IBW/kg (Calculated) : 63.8  Temp (24hrs), Avg:97.8 F (36.6 C), Min:97.8 F (36.6 C), Max:97.8 F (36.6 C)   Recent Labs Lab 01/23/17 1222  WBC 5.8  CREATININE 1.34*    Estimated Creatinine Clearance: 99.5 mL/min (A) (by C-G formula based on SCr of 1.34 mg/dL (H)).    No Known Allergies  Antimicrobials this admission: 8/16 azithromycin 500mg  iv  >>  8/16 ceftriaxone 1gm iv >>    Microbiology results: 8/16 BCx: collected x 2  8/16 UCx:  Collected   Thank you for allowing pharmacy to be a part of this patient's care.  Gerre PebblesGarrett Zenia Guest 01/23/2017 1:13 PM

## 2017-01-23 NOTE — Progress Notes (Signed)
Patient c/o left sided chest pain 6 out of 10.  Standing orders placed for stat EKG and nitroglycerin. 1 sublingual nitro was given, EKG performed and showed Sinus Tachy, otherwise normal. VSS, 2L O2 nasal canula already applied to patient. MD paged, awaiting response. Pain decreased after sublingual nitro, will continue to monitor.

## 2017-01-23 NOTE — Progress Notes (Signed)
[place] rounding ED met pt and his family at bedside. [place] talked with pt and family briefly as RN was evaluating pt. [place] services were not needed at the time, but [facility] will follow up with pt as needed to provided pastoral care.   01/23/17 1400  Clinical Encounter Type  Visited With Patient and family together  Visit Type Initial;Spiritual support  Referral From [place];Other (Comment)

## 2017-01-23 NOTE — ED Provider Notes (Signed)
[facility] Emergency Department Provider Note  Time seen: 12:44 PM  I have reviewed the triage vital signs and the nursing notes.   HISTORY  Chief Complaint Shortness of Breath and Hematemesis    HPI Benjamin Goodman. is a 31 y.o. male with a past medical history of hypertension who presents to the emergency department for 5 days of cough with occasional hemoptysis, shortness of breath. Patient was seen at the health department and tested for TB, per patient this was negative. Patient went to the walk-in clinic today as he was feeling more short of breath and was referred to the ER given hypoxia and tachycardia. Patient denies any history of blood clots. Patient states he has had fever at home as high as 104. Denies any chest pain. States minimal upper abdominal pain which he relates to coughing.  Past Medical History:  Diagnosis Date  . Hypertension     There are no active problems to display for this patient.   Past Surgical History:  Procedure Laterality Date  . TONSILLECTOMY      Prior to Admission medications   Medication Sig Start Date End Date Taking? Authorizing Provider  ciprofloxacin (CIPRO) 500 MG tablet Take 1 tablet (500 mg total) by mouth 2 (two) times daily. 03/09/15   Hassan Rowan, MD  fluconazole (DIFLUCAN) 150 MG tablet Take 1 tablet po once, then repeat in 1 week 10/27/15   Payton Mccallum, MD  lisinopril (PRINIVIL,ZESTRIL) 20 MG tablet Take 20 mg by mouth daily.    [provider]  ondansetron (ZOFRAN ODT) 4 MG disintegrating tablet Take 1 tablet (4 mg total) by mouth every 8 (eight) hours as needed for nausea or vomiting. 03/09/15   Renford Dills, NP  ondansetron (ZOFRAN ODT) 8 MG disintegrating tablet Take 1 tablet (8 mg total) by mouth 2 (two) times daily. 03/07/15   Payton Mccallum, MD  oxyCODONE-acetaminophen (ROXICET) 5-325 MG tablet Take 1 tablet by mouth every 8 (eight) hours as needed for moderate pain or severe pain (Do  not drive or operate heavy machinery while taking as can cause drowsiness.). 03/09/15   Renford Dills, NP    No Known Allergies  No family history on file.  Social History Social History  Substance Use Topics  . Smoking status: Never Smoker  . Smokeless tobacco: Never Used  . Alcohol use No    Review of Systems Constitutional: Positive for fever. Cardiovascular: Negative for chest pain. Respiratory: Negative for shortness of breath. Gastrointestinal: Negative for abdominal pain, vomiting and diarrhea. Musculoskeletal: Negative for back pain. Negative for leg pain or swelling Neurological: Negative for headache All other ROS negative  ____________________________________________   PHYSICAL EXAM:  VITAL SIGNS: ED Triage Vitals  Enc Vitals Group     BP 01/23/17 1219 (!) 184/120     Pulse Rate 01/23/17 1219 (!) 107     Resp 01/23/17 1219 18     Temp 01/23/17 1219 97.8 F (36.6 C)     Temp Source 01/23/17 1219 Oral     SpO2 01/23/17 1219 91 %     Weight 01/23/17 1219 270 lb (122.5 kg)     Height 01/23/17 1219 5\' 6"  (1.676 m)     Head Circumference --      Peak Flow --      Pain Score 01/23/17 1218 9     Pain Loc --      Pain Edu? --      Excl. in GC? --  Constitutional: Alert and oriented. Well appearing and in no distress. Eyes: Normal exam ENT   Head: Normocephalic and atraumatic   Mouth/Throat: Mucous membranes are moist. Cardiovascular:Regular rhythm, rate around 110 bpm. No obvious murmur. Respiratory: Normal respiratory effort without tachypnea nor retractions. Breath sounds are clear. Occasional cough. Gastrointestinal: Soft and nontender. No distention.   Musculoskeletal: Nontender with normal range of motion in all extremities.  Neurologic:  Normal speech and language. No gross focal neurologic deficits  Skin:  Skin is warm, dry and intact.  Psychiatric: Mood and affect are normal.   ____________________________________________     EKG  EKG reviewed and interpreted by myself shows sinus tachycardia 103 bpm, narrow QRS, some left axis deviation, normal intervals, nonspecific ST changes without elevation.  ____________________________________________    RADIOLOGY  Chest x-ray concerning for bilateral pneumonia. CT negative for PE  ____________________________________________   INITIAL IMPRESSION / ASSESSMENT AND PLAN / ED COURSE  Pertinent labs & imaging results that were available during my care of the patient were reviewed by me and considered in my medical decision making (see chart for details).  The patient presents to the emergency department with cough, occasional hemoptysis, shortness of breath. Patient is hypoxic to 86% on room air with no home O2 requirement. Patient states temperature as high as 104 at home currently afebrile but tachycardic with a pulse ox around 92-93% on 2 L oxygen. Given the patient's hypoxia or tachycardia and occasional hemoptysis we'll obtain a CT angiography of the chest to rule out PE/pneumonia. I anticipate likely admission given hypoxia on room air with no home O2 requirement.   [facility] clinic physician came to the emergency department to discuss this patient, simply placed on Levaquin several days ago and due to abnormal chest x-ray. States patient has been satting normal until today which prompted the ER visit. Patient's labs are normal white blood cell count. His x-ray shows patchy opacities throughout the right lung fields. We'll obtain a CT angiography. Patient will require admission to the hospital. Of note the patient has undergone initial TB testing which is negative, PPD negative, sputum so far negative, HIV test negative.  Chest x-ray consistent with multilobar pneumonia. CT angiography negative for PE, likely alveolar hemorrhage. We will continue the IV antibiotics and admitted to the hospital.  CRITICAL CARE Performed by: Minna AntisPADUCHOWSKI, Mickenzie Stolar   Total critical  care time: 30 minutes  Critical care time was exclusive of separately billable procedures and treating other patients.  Critical care was necessary to treat or prevent imminent or life-threatening deterioration.  Critical care was time spent personally by me on the following activities: development of treatment plan with patient and/or surrogate as well as nursing, discussions with consultants, evaluation of patient's response to treatment, examination of patient, obtaining history from patient or surrogate, ordering and performing treatments and interventions, ordering and review of laboratory studies, ordering and review of radiographic studies, pulse oximetry and re-evaluation of patient's condition.  ____________________________________________   FINAL CLINICAL IMPRESSION(S) / ED DIAGNOSES  Fever Sepsis Pneumonia    Minna AntisPaduchowski, Glory Graefe, MD 01/23/17 1419

## 2017-01-23 NOTE — ED Triage Notes (Addendum)
Pt to ED via POV with c/o SOB and hematemesis with upper abd pain since Saturday, [facility] and tested neg for TB.Pt denies dark, tarry stool.  Pt htn, states hasn't taken BP meds in "years". 92% on RA.

## 2017-01-23 NOTE — Care Management (Signed)
Admitted from home with shortness of breath, fever and hemoptysis.  Treated as an outpatient with oral antibiotics.  Found to have multi lobular pneumonia.  Requiring supplemental oxygen.  Does not have underlying chronic cardiopulmonary disease prior to this admission.

## 2017-01-23 NOTE — ED Notes (Signed)
Patient transported to X-ray 

## 2017-01-23 NOTE — H&P (Signed)
[facility] [place] [facility]   PATIENT NAME: Benjamin ChimesClayton Goodman    MR#:  [id number]  DATE OF BIRTH:  21-Dec-1985  DATE OF ADMISSION:  01/23/2017  PRIMARY CARE PHYSICIAN: Patrice ParadiseMcLaughlin, Miriam K, MD   REQUESTING/REFERRING PHYSICIAN: Dr. Minna AntisKevin Paduchowski CHIEF COMPLAINT; shortness of breath    Chief Complaint  Patient presents with  . Shortness of Breath  . Hematemesis    HISTORY OF PRESENT ILLNESS:  Benjamin ChimesClayton Goodman  is a 31 y.o. male with a known history of Potential not on medicines for the last 2 years comes in because of shortness of breath, cough and fever. Patient has been having symptoms for 5 days and recently was tested at health Department for TV and TB test is negative. He is referred to emergency room today because of hypoxia, tachycardia. Patient continued to have fever up to 104 Fahrenheit. Sent from walk-in clinic because of hypoxia with O2 sats 86% on room air. Now on 2 L of oxygen saturation 94%. Patient has been having occasional hemoptysis and has shortness of breath and fever .on to have malignant hypertension with 170/100 and tachycardia up to 1 10 bpm. CT [place] chest showed multilobar pneumonia.  He denies any travel.and exposure to person with TB. Fianc diagnosed with pneumonia 4 weeks ago. Patient took Levaquin for 2 days.  PAST MEDICAL HISTORY:   Past Medical History:  Diagnosis Date  . Hypertension     PAST SURGICAL HISTOIRY:   Past Surgical History:  Procedure Laterality Date  . TONSILLECTOMY      SOCIAL HISTORY:   Social History  Substance Use Topics  . Smoking status: Never Smoker  . Smokeless tobacco: Never Used  . Alcohol use No    FAMILY HISTORY:  No family history on file. Mother has history of hypertension hypertension  DRUG ALLERGIES:  No Known Allergies  REVIEW OF SYSTEMS:  CONSTITUTIONAL:Fever, fatigue, : No blurred or double vision.  EARS, NOSE, AND THROAT: No tinnitus or ear pain.  RESPIRATORY:Cough,  shortness of breath, occasional hemoptysis for the past 4 days  CARDIOVASCULAR:Pleuritic chest pain on the left side  orthopnea, edema.  GASTROINTESTINAL: No nausea, vomiting, diarrhea or abdominal pain.  GENITOURINARY: No dysuria, hematuria.  ENDOCRINE: No polyuria, nocturia,  HEMATOLOGY: No anemia, easy bruising or bleeding SKIN: No rash or lesion. MUSCULOSKELETAL: No joint pain or arthritis.   NEUROLOGIC: No tingling, numbness, weakness.  PSYCHIATRY: No anxiety or depression.   MEDICATIONS AT HOME:   Prior to Admission medications   Medication Sig Start Date End Date Taking? Authorizing Provider  benzonatate (TESSALON) 200 MG capsule Take 200 mg by mouth 3 (three) times daily as needed for cough. 01/21/17 01/28/17 Yes [provider]  guaiFENesin-codeine 100-10 MG/5ML syrup Take 5 mLs by mouth every 4 (four) hours as needed. 01/21/17  Yes [provider]  levofloxacin (LEVAQUIN) 500 MG tablet Take 500 mg by mouth daily. 01/21/17 01/28/17 Yes [provider]  ondansetron (ZOFRAN ODT) 4 MG disintegrating tablet Take 1 tablet (4 mg total) by mouth every 8 (eight) hours as needed for nausea or vomiting. Patient not taking: Reported on 01/23/2017 03/09/15   Renford DillsMiller, Lindsey, NP  ondansetron (ZOFRAN ODT) 8 MG disintegrating tablet Take 1 tablet (8 mg total) by mouth 2 (two) times daily. Patient not taking: Reported on 01/23/2017 03/07/15   Payton Mccallumonty, [place], MD  oxyCODONE-acetaminophen (ROXICET) 5-325 MG tablet Take 1 tablet by mouth every 8 (eight) hours as needed for moderate pain or severe pain (Do not  drive or operate heavy machinery while taking as can cause drowsiness.). Patient not taking: Reported on 01/23/2017 03/09/15   Renford Dills, NP      VITAL SIGNS:  Blood pressure (!) 164/106, pulse 98, temperature 97.8 F (36.6 C), temperature source Oral, resp. rate 19, height 5\' 6"  (1.676 m), weight 122.5 kg (270 lb), SpO2 96 %.  PHYSICAL EXAMINATION:  GENERAL:  31  y.o.-year-old patient lying in the bed with no acute distress.  EYES: Pupils equal, round, reactive to light and accommodation. No scleral icterus. Extraocular muscles intact.  HEENT: Head atraumatic, normocephalic. Oropharynx and nasopharynx clear.  NECK:  Supple, no jugular venous distention. No thyroid enlargement, no tenderness.  LUNGS: Normal breath sounds bilaterally, no wheezing, rales,rhonchi or crepitation. No use of accessory muscles of respiration.  CARDIOVASCULAR: S1, S2Tachycardic. No murmurs, rubs, or gallops.  ABDOMEN: Soft, nontender, nondistended. Bowel sounds present. No organomegaly or mass.  EXTREMITIES: No pedal edema, cyanosis, or clubbing.  NEUROLOGIC: Cranial nerves II through XII are intact. Muscle strength 5/5 in all extremities. Sensation intact. Gait not checked.  PSYCHIATRIC: The patient is alert and oriented x 3.  SKIN: No obvious rash, lesion, or ulcer.   LABORATORY PANEL:   CBC  Recent Labs Lab 01/23/17 1222  WBC 5.8  HGB 15.0  HCT 44.0  PLT 152   ------------------------------------------------------------------------------------------------------------------  Chemistries   Recent Labs Lab 01/23/17 1222  NA 139  K 3.0*  CL 101  CO2 29  GLUCOSE 101*  BUN 22*  CREATININE 1.34*  CALCIUM 8.8*   ------------------------------------------------------------------------------------------------------------------  Cardiac Enzymes  Recent Labs Lab 01/23/17 1222  TROPONINI 0.03*   ------------------------------------------------------------------------------------------------------------------  RADIOLOGY:  Dg Chest 2 View  Result Date: 01/23/2017 CLINICAL DATA:  Chest pain, shortness of breath, hemoptysis, fever, and elevated blood pressure for the past 4 days. EXAM: CHEST  2 VIEW COMPARISON:  Chest x-ray of September 08, 2026 2013 and left shoulder series of June 12, 2013. FINDINGS: The lungs are reasonably well inflated. There are confluent  alveolar opacities in the right upper and right middle lobes and early alveolar changes in the left lower lobe laterally. There is no pleural effusion. The cardiac silhouette is enlarged. The pulmonary vascularity is not engorged. The trachea is midline. The bony thorax is unremarkable. IMPRESSION: Findings most compatible with bilateral pneumonia. Followup PA and lateral chest X-ray is recommended in 3-4 weeks following trial of antibiotic therapy to ensure resolution and exclude underlying malignancy. If the patient's clinical condition worsens in the meantime, chest CT scanning would be recommended. Electronically Signed   By: David  [place] M.D.   On: 01/23/2017 13:06   Ct Angio Chest Pe W And/or Wo Contrast  Result Date: 01/23/2017 CLINICAL DATA:  Hemoptysis. Hematemesis and dark bloody stool. Symptoms for 1 week. EXAM: CT ANGIOGRAPHY CHEST WITH CONTRAST TECHNIQUE: Multidetector CT imaging of the chest was performed using the standard protocol during bolus administration of intravenous contrast. Multiplanar CT image reconstructions and MIPs were obtained to evaluate the vascular anatomy. CONTRAST:  100 cc Isovue 370 intravenous COMPARISON:  09/11/2008 FINDINGS: Cardiovascular: Satisfactory opacification of the pulmonary arteries to the segmental level. No evidence of pulmonary embolism. Borderline heart size. No pericardial effusion. Mediastinum/Nodes: Prominence of mediastinal and hilar lymph nodes, greater to the right. A right peritracheal node measures 14 mm in short axis. Lungs/Pleura: Diffuse airspace disease with both consolidative and ground-glass features in the right more than left lung. The right lobes are essentially equally affected. There is patchy involvement in the left upper  and left lower lobe. Trace right pleural fluid. No septal thickening in areas free of airspace disease. Mild atelectasis in the left lower lobe. No cavitary features. Clear central airways. Given the clinical history,  diffuse alveolar hemorrhage is favored. Usually, airspace disease like this would reflect multi lobar pneumonia. Severe acute hypersensitivity pneumonitis can have this appearance, but usually more symmetric and without hemoptysis. Upper Abdomen: No acute finding. Musculoskeletal: No acute or aggressive finding Review of the MIP images confirms the above findings. IMPRESSION: 1. Widespread airspace disease, particularly extensive on the right. Given the clinical history, pattern favors diffuse alveolar hemorrhage over multi lobar pneumonia. No evidence of airway lesion. 2. Mild mediastinal nodal enlargement and trace right pleural fluid, presumably reactive. 3. Negative for pulmonary embolism. Electronically Signed   By: Marnee Spring M.D.   On: 01/23/2017 14:08    EKG:   Orders placed or performed during the hospital encounter of 01/23/17  . ED EKG within 10 minutes  . ED EKG within 10 minutes  . EKG 12-Lead  . EKG 12-Lead  . ED EKG 12-Lead  . ED EKG 12-Lead  KG shows sinus tachycardia 10 3 bpm no ST T changes.  IMPRESSION AND PLAN:    Acute respiratory failure with hypoxia secondary to multilobar pneumonia: Failed outpatient therapy with significant symptoms and hypoxia, tachycardia admit to telemetry, start IV Rocephin, Zithromax, oxygen. Rule out TB at health department recently. Patient does have some alveolar hemorrhage secondary to significant cough.  #2/ malignant hypertension: Patient has history of high blood pressure but the stopper take medicine 2 years ago. Admitted to telemetry, started on metoprolol, hydralazine, slightly elevated troponins likely due to pneumonia. Cycle the troponins, order echo and fasting lipids  Pleuritic chest pain and pain in the left upper quadrant due to pneumonia. D/w mother  All the records are reviewed and case discussed with ED provider. Management plans discussed with the patient, family and they are in agreement.  CODE STATUS: full  TOTAL  TIME TAKING CARE OF THIS PATIENT: .    Katha Hamming M.D on 01/23/2017 at 3:13 PM  Between 7am to 6pm - Pager - [phone]  After 6pm go to [email or web address] - password [email or web address] [facility]  [facility]  Office  [phone]  CC: Primary care physician; Patrice Paradise, MD  Note: This dictation was prepared with Dragon dictation along with smaller phrase technology. Any transcriptional errors that result from this process are unintentional.

## 2017-01-23 NOTE — ED Notes (Signed)
Pt oxygen 86% on RA. Pt placed on 2L [place]. EDP at bedside.  Pt reports emesis of blood and coughing up blood X 1 week. Fever at home. Tested for TB at health department which pt reports as negative.

## 2017-01-24 ENCOUNTER — Inpatient Hospital Stay
Admit: 2017-01-24 | Discharge: 2017-01-24 | Disposition: A | Discharge status: Home / Self Care | Payer: 59 | Attending: Internal Medicine | Admitting: Internal Medicine

## 2017-01-24 DIAGNOSIS — J9601 Acute respiratory failure with hypoxia: Secondary | ICD-10-CM

## 2017-01-24 LAB — BASIC METABOLIC PANEL
ANION GAP: 10 (ref 5–15)
BUN: 22 mg/dL — AB (ref 6–20)
CHLORIDE: 102 mmol/L (ref 101–111)
CO2: 26 mmol/L (ref 22–32)
Calcium: 8.3 mg/dL — ABNORMAL LOW (ref 8.9–10.3)
Creatinine, Ser: 1.14 mg/dL (ref 0.61–1.24)
GFR calc Af Amer: >60 mL/min (ref >60)
GLUCOSE: 106 mg/dL — AB (ref 65–99)
POTASSIUM: 3.2 mmol/L — AB (ref 3.5–5.1)
Sodium: 138 mmol/L (ref 135–145)

## 2017-01-24 LAB — TROPONIN I: TROPONIN I: 0.04 ng/mL — AB (ref <0.03)

## 2017-01-24 LAB — CBC
HEMATOCRIT: 43.5 % (ref 40.0–52.0)
HEMOGLOBIN: 14.6 g/dL (ref 13.0–18.0)
MCH: 27.6 pg (ref 26.0–34.0)
MCHC: 33.6 g/dL (ref 32.0–36.0)
MCV: 82 fL (ref 80.0–100.0)
Platelets: 166 10*3/uL (ref 150–440)
RBC: 5.3 MIL/uL (ref 4.40–5.90)
RDW: 15.3 % — ABNORMAL HIGH (ref 11.5–14.5)
WBC: 8.4 10*3/uL (ref 3.8–10.6)

## 2017-01-24 LAB — EXPECTORATED SPUTUM ASSESSMENT W GRAM STAIN, RFLX TO RESP C

## 2017-01-24 LAB — EXPECTORATED SPUTUM ASSESSMENT W REFEX TO RESP CULTURE

## 2017-01-24 LAB — URINE CULTURE: Culture: NO GROWTH

## 2017-01-24 LAB — PROCALCITONIN: PROCALCITONIN: 5.19 ng/mL

## 2017-01-24 LAB — LIPID PANEL
CHOL/HDL RATIO: 13.5 ratio
CHOLESTEROL: 162 mg/dL (ref 0–200)
HDL: 12 mg/dL — ABNORMAL LOW (ref >40)
LDL CALC: 79 mg/dL (ref 0–99)
TRIGLYCERIDES: 354 mg/dL — AB (ref <150)
VLDL: 71 mg/dL — AB (ref 0–40)

## 2017-01-24 LAB — ECHOCARDIOGRAM COMPLETE
Height: 66 in
Weight: 4320 oz

## 2017-01-24 LAB — HIV ANTIBODY (ROUTINE TESTING W REFLEX): HIV SCREEN 4TH GENERATION: NONREACTIVE

## 2017-01-24 LAB — GLUCOSE, CAPILLARY: Glucose-Capillary: 77 mg/dL (ref 65–99)

## 2017-01-24 LAB — MAGNESIUM: Magnesium: 2.1 mg/dL (ref 1.7–2.4)

## 2017-01-24 MED ORDER — METHYLPREDNISOLONE SODIUM SUCC 40 MG IJ SOLR
40.0000 mg | Freq: Four times a day (QID) | INTRAMUSCULAR | Status: DC
  Administered 2017-01-24: 40 mg via INTRAVENOUS
  Filled 2017-01-24: qty 1

## 2017-01-24 MED ORDER — POTASSIUM CHLORIDE CRYS ER 20 MEQ PO TBCR
40.0000 meq | EXTENDED_RELEASE_TABLET | Freq: Once | ORAL | Status: AC
  Administered 2017-01-24: 40 meq via ORAL
  Filled 2017-01-24: qty 2

## 2017-01-24 MED ORDER — METHYLPREDNISOLONE SODIUM SUCC 40 MG IJ SOLR
40.0000 mg | Freq: Two times a day (BID) | INTRAMUSCULAR | Status: DC
  Administered 2017-01-24 – 2017-01-26 (×5): 40 mg via INTRAVENOUS
  Filled 2017-01-24 (×5): qty 1

## 2017-01-24 MED ORDER — BUDESONIDE 0.5 MG/2ML IN SUSP
0.5000 mg | Freq: Two times a day (BID) | RESPIRATORY_TRACT | Status: DC
  Administered 2017-01-24 – 2017-01-27 (×6): 0.5 mg via RESPIRATORY_TRACT
  Filled 2017-01-24 (×6): qty 2

## 2017-01-24 MED ORDER — ENSURE ENLIVE PO LIQD
237.0000 mL | Freq: Three times a day (TID) | ORAL | Status: DC
  Administered 2017-01-24 – 2017-01-25 (×3): 237 mL via ORAL

## 2017-01-24 MED ORDER — LISINOPRIL 5 MG PO TABS
5.0000 mg | ORAL_TABLET | Freq: Every day | ORAL | Status: DC
  Administered 2017-01-24 – 2017-01-25 (×2): 5 mg via ORAL
  Filled 2017-01-24 (×2): qty 1

## 2017-01-24 MED ORDER — IPRATROPIUM-ALBUTEROL 0.5-2.5 (3) MG/3ML IN SOLN
3.0000 mL | Freq: Four times a day (QID) | RESPIRATORY_TRACT | Status: DC
  Administered 2017-01-24 – 2017-01-27 (×12): 3 mL via RESPIRATORY_TRACT
  Filled 2017-01-24 (×12): qty 3

## 2017-01-24 NOTE — Progress Notes (Signed)
Pharmacy Antibiotic Note  Benjamin Goodman. is a 31 y.o. male admitted on 01/23/2017 with pneumonia.  Pharmacy has been consulted for azithromycin and ceftriaxone dosing.  Plan: azithromycin 500mg  iv q24h & ceftriaxone 1gm iv q24h   Height: 5\' 6"  (167.6 cm) Weight: 270 lb (122.5 kg) IBW/kg (Calculated) : 63.8  Temp (24hrs), Avg:99 F (37.2 C), Min:97.8 F (36.6 C), Max:100 F (37.8 C)   Recent Labs Lab 01/23/17 1222 01/23/17 1258 01/24/17 0243  WBC 5.8  --  8.4  CREATININE 1.34*  --  1.14  LATICACIDVEN  --  1.6  --     Estimated Creatinine Clearance: 117 mL/min (by C-G formula based on SCr of 1.14 mg/dL).    No Known Allergies  Antimicrobials this admission: 8/16 azithromycin 500mg  iv  >>  8/16 ceftriaxone 1gm iv >>    Microbiology results: 8/16 BCx: collected x 2  8/16 UCx:  Collected   Thank you for allowing pharmacy to be a part of this patient's care.  Denissa Cozart A 01/24/2017 9:29 AM

## 2017-01-24 NOTE — Consult Note (Signed)
[facility] Cardiology  CARDIOLOGY CONSULT NOTE  Patient ID: Benjamin Goodman. MRN: [id number] DOB/AGE: [birth year] 30 y.o.  Admit date: 01/23/2017 Referring Physician Cherlynn Kaiser Primary Physician [facility] Primary Cardiologist  Reason for Consultation Chest pain  HPI: 31 year old gentleman referred for evaluation of chest pain and borderline elevated troponin. The patient has a one-week history of shortness of breath, productive cough, intermittent hemoptysis and fever. Patient was treated with Levaquin as outpatient for the past 2 days. He presents to [facility] emergency room with persistent symptoms. He was noted to be hypoxic with an oxygen saturation of 86% on room air. CT of the chest revealed multi lobar pneumonia. The patient was recently tested was negative for TB. The patient complains of left-sided chest discomfort, pleuritic and positional in nature. Admission labs were notable for borderline elevated troponin of 0.04.  Review of systems complete and found to be negative unless listed above     Past Medical History:  Diagnosis Date  . Hypertension     Past Surgical History:  Procedure Laterality Date  . TONSILLECTOMY      Prescriptions Prior to Admission  Medication Sig Dispense Refill Last Dose  . benzonatate (TESSALON) 200 MG capsule Take 200 mg by mouth 3 (three) times daily as needed for cough.   01/23/2017 at 0800  . guaiFENesin-codeine 100-10 MG/5ML syrup Take 5 mLs by mouth every 4 (four) hours as needed.   prn at prn  . levofloxacin (LEVAQUIN) 500 MG tablet Take 500 mg by mouth daily.   01/23/2017 at 0800  . ondansetron (ZOFRAN ODT) 4 MG disintegrating tablet Take 1 tablet (4 mg total) by mouth every 8 (eight) hours as needed for nausea or vomiting. (Patient not taking: Reported on 01/23/2017) 15 tablet 0 Completed Course at Unknown time  . ondansetron (ZOFRAN ODT) 8 MG disintegrating tablet Take 1 tablet (8 mg total) by mouth 2 (two) times daily. (Patient not taking: Reported on  01/23/2017) 6 tablet 0 Completed Course at Unknown time  . oxyCODONE-acetaminophen (ROXICET) 5-325 MG tablet Take 1 tablet by mouth every 8 (eight) hours as needed for moderate pain or severe pain (Do not drive or operate heavy machinery while taking as can cause drowsiness.). (Patient not taking: Reported on 01/23/2017) 9 tablet 0 Completed Course at Unknown time   Social History   Social History  . Marital status: Single    Spouse name: N/A  . Number of children: N/A  . Years of education: N/A   Occupational History  . Not on file.   Social History Main Topics  . Smoking status: Never Smoker  . Smokeless tobacco: Never Used  . Alcohol use No  . Drug use: No  . Sexual activity: Not on file   Other Topics Concern  . Not on file   Social History Narrative  . No narrative on file    History reviewed. No pertinent family history.    Review of systems complete and found to be negative unless listed above      PHYSICAL EXAM  General: Well developed, well nourished, in no acute distress HEENT:  Normocephalic and atramatic Neck:  No JVD.  Lungs: Clear bilaterally to auscultation and percussion. Heart: HRRR . Normal S1 and S2 without gallops or murmurs.  Abdomen: Bowel sounds are positive, abdomen soft and non-tender  Msk:  Back normal, normal gait. Normal strength and tone for age. Extremities: No clubbing, cyanosis or edema.   Neuro: Alert and oriented X 3. Psych:  Good affect, responds appropriately  Labs:   Lab Results  Component Value Date   WBC 8.4 01/24/2017   HGB 14.6 01/24/2017   HCT 43.5 01/24/2017   MCV 82.0 01/24/2017   PLT 166 01/24/2017    Recent Labs Lab 01/24/17 0243  NA 138  K 3.2*  CL 102  CO2 26  BUN 22*  CREATININE 1.14  CALCIUM 8.3*  GLUCOSE 106*   Lab Results  Component Value Date   TROPONINI 0.04 (HH) 01/24/2017    Lab Results  Component Value Date   CHOL 162 01/24/2017   Lab Results  Component Value Date   HDL 12 (L)  01/24/2017   Lab Results  Component Value Date   LDLCALC 79 01/24/2017   Lab Results  Component Value Date   TRIG 354 (H) 01/24/2017   Lab Results  Component Value Date   CHOLHDL 13.5 01/24/2017   No results found for: LDLDIRECT    Radiology: Dg Chest 2 View  Result Date: 01/23/2017 CLINICAL DATA:  Chest pain, shortness of breath, hemoptysis, fever, and elevated blood pressure for the past 4 days. EXAM: CHEST  2 VIEW COMPARISON:  Chest x-ray of September 08, 2026 2013 and left shoulder series of June 12, 2013. FINDINGS: The lungs are reasonably well inflated. There are confluent alveolar opacities in the right upper and right middle lobes and early alveolar changes in the left lower lobe laterally. There is no pleural effusion. The cardiac silhouette is enlarged. The pulmonary vascularity is not engorged. The trachea is midline. The bony thorax is unremarkable. IMPRESSION: Findings most compatible with bilateral pneumonia. Followup PA and lateral chest X-ray is recommended in 3-4 weeks following trial of antibiotic therapy to ensure resolution and exclude underlying malignancy. If the patient's clinical condition worsens in the meantime, chest CT scanning would be recommended. Electronically Signed   By: David  [place] M.D.   On: 01/23/2017 13:06   Ct Angio Chest Pe W And/or Wo Contrast  Result Date: 01/23/2017 CLINICAL DATA:  Hemoptysis. Hematemesis and dark bloody stool. Symptoms for 1 week. EXAM: CT ANGIOGRAPHY CHEST WITH CONTRAST TECHNIQUE: Multidetector CT imaging of the chest was performed using the standard protocol during bolus administration of intravenous contrast. Multiplanar CT image reconstructions and MIPs were obtained to evaluate the vascular anatomy. CONTRAST:  100 cc Isovue 370 intravenous COMPARISON:  09/11/2008 FINDINGS: Cardiovascular: Satisfactory opacification of the pulmonary arteries to the segmental level. No evidence of pulmonary embolism. Borderline heart size. No  pericardial effusion. Mediastinum/Nodes: Prominence of mediastinal and hilar lymph nodes, greater to the right. A right peritracheal node measures 14 mm in short axis. Lungs/Pleura: Diffuse airspace disease with both consolidative and ground-glass features in the right more than left lung. The right lobes are essentially equally affected. There is patchy involvement in the left upper and left lower lobe. Trace right pleural fluid. No septal thickening in areas free of airspace disease. Mild atelectasis in the left lower lobe. No cavitary features. Clear central airways. Given the clinical history, diffuse alveolar hemorrhage is favored. Usually, airspace disease like this would reflect multi lobar pneumonia. Severe acute hypersensitivity pneumonitis can have this appearance, but usually more symmetric and without hemoptysis. Upper Abdomen: No acute finding. Musculoskeletal: No acute or aggressive finding Review of the MIP images confirms the above findings. IMPRESSION: 1. Widespread airspace disease, particularly extensive on the right. Given the clinical history, pattern favors diffuse alveolar hemorrhage over multi lobar pneumonia. No evidence of airway lesion. 2. Mild mediastinal nodal enlargement and trace right pleural fluid, presumably reactive.  3. Negative for pulmonary embolism. Electronically Signed   By: Marnee Spring M.D.   On: 01/23/2017 14:08    EKG: Sinus tachycardia  ASSESSMENT AND PLAN:   1. Pleuritic chest pain, in the setting of multi lobar pneumonia, likely noncardiac 2. Borderline elevated troponin, likely demand supply ischemia, not due to acute coronary syndrome 3. Multi lobar pneumonia 4. Essential hypertension  Recommendations  1. Agree with current therapy 2. Defer full dose anticoagulation 3. Defer further cardiac diagnostics at this time  Signed: Marcina Millard MD,PhD, [facility] 01/24/2017, 8:01 AM

## 2017-01-24 NOTE — Progress Notes (Signed)
*  PRELIMINARY RESULTS* Echocardiogram 2D Echocardiogram has been performed.  Cristela Blue 01/24/2017, 9:03 AM

## 2017-01-24 NOTE — Progress Notes (Signed)
Initial Nutrition Assessment  DOCUMENTATION CODES:   Severe malnutrition in context of acute illness/injury  INTERVENTION:  1. Recommend Ensure Enlive po TID, each supplement provides 350 kcal and 20 grams of protein  NUTRITION DIAGNOSIS:   Malnutrition (Severe) related to acute illness (Multilobar PNA) as evidenced by percent weight loss, energy intake < or equal to 50% for > or equal to 5 days.  GOAL:   Patient will meet greater than or equal to 90% of their needs  MONITOR:   PO intake, Labs, Supplement acceptance, I & O's  REASON FOR ASSESSMENT:   Malnutrition Screening Tool    ASSESSMENT:   Benjamin Goodman has a PMHHx of HTN presents with cough, hemoptysis, SOB, Fever of 104, found to have multilobar PNA and alveolar hemorrhage due to cough.  Spoke to patient, family, patient has been eating very little since Sunday. Went 2 days without eating anything. Normally eats 3 meals per day, UBW of 280 lbs. Presents with 10#/3.5% severe wt loss over 6 days. Complains of LUQ pain. Can drink, but otherwise is not hungry, no appetite, not eating. Amenable to drinking ensure until appetite returns. Monitor supplement acceptance.  No UOP documented yesterday   Medications reviewed: NS at 31mL/hr  Labs reviewed:  K 3.2, Elevated LFTs   Diet Order:  Diet Heart Room service appropriate? Yes; Fluid consistency: Thin  Skin:  Reviewed, no issues  Last BM:  PTA  Height:   Ht Readings from Last 1 Encounters:  01/23/17 5\' 6"  (1.676 m)    Weight:   Wt Readings from Last 1 Encounters:  01/23/17 270 lb (122.5 kg)    Ideal Body Weight:  64.54 kg  BMI:  Body mass index is 43.58 kg/m.  Estimated Nutritional Needs:   Kcal:  1610-9604 (MSJ x1.1-1.3)  Protein:  147-160 grams (1.2-1.3g/kg)  Fluid:  2.3-2.8L  EDUCATION NEEDS:   No education needs identified at this time  Dionne Ano. Jem Castro, MS, RD LDN Inpatient Clinical Dietitian Pager [phone]

## 2017-01-24 NOTE — Progress Notes (Signed)
[facility] [place] [facility]   PATIENT NAME: Benjamin Goodman    MR#:  [id number]  DATE OF BIRTH:  May 23, 1986  SUBJECTIVE:   Patient here due to shortness of breath and hemoptysis and suspected to have multifocal pneumonia. Still feels short of breath with productive cough which is blood tinged in nature. Hemoglobin stable.  REVIEW OF SYSTEMS:    Review of Systems  Constitutional: Negative for chills and fever.  HENT: Negative for congestion and tinnitus.   Eyes: Negative for blurred vision and double vision.  Respiratory: Positive for cough, hemoptysis and shortness of breath. Negative for wheezing.   Cardiovascular: Negative for chest pain, orthopnea and PND.  Gastrointestinal: Negative for abdominal pain, diarrhea, nausea and vomiting.  Genitourinary: Negative for dysuria and hematuria.  Neurological: Negative for dizziness, sensory change and focal weakness.  All other systems reviewed and are negative.   Nutrition: Heart healthy Tolerating Diet: Yes Tolerating PT: Ambulatory  DRUG ALLERGIES:  No Known Allergies  VITALS:  Blood pressure (!) 172/105, pulse (!) 106, temperature 98.4 F (36.9 C), temperature source Oral, resp. rate 18, height 5\' 6"  (1.676 m), weight 122.5 kg (270 lb), SpO2 92 %.  PHYSICAL EXAMINATION:   Physical Exam  GENERAL:  31 y.o.-year-old patient lying in bed in mild Resp. Distress.   EYES: Pupils equal, round, reactive to light and accommodation. No scleral icterus. Extraocular muscles intact.  HEENT: Head atraumatic, normocephalic. Oropharynx and nasopharynx clear.  NECK:  Supple, no jugular venous distention. No thyroid enlargement, no tenderness.  LUNGS: Good a/e B/l, wheezing/rhonchi on right side > left. No use of accessory muscles of respiration.  CARDIOVASCULAR: S1, S2 normal. No murmurs, rubs, or gallops.  ABDOMEN: Soft, nontender, nondistended. Bowel sounds present. No organomegaly or mass.  EXTREMITIES: No  cyanosis, clubbing or edema b/l.    NEUROLOGIC: Cranial nerves II through XII are intact. No focal Motor or sensory deficits b/l.   PSYCHIATRIC: The patient is alert and oriented x 3.  SKIN: No obvious rash, lesion, or ulcer.    LABORATORY PANEL:   CBC  Recent Labs Lab 01/24/17 0243  WBC 8.4  HGB 14.6  HCT 43.5  PLT 166   ------------------------------------------------------------------------------------------------------------------  Chemistries   Recent Labs Lab 01/24/17 0243  NA 138  K 3.2*  CL 102  CO2 26  GLUCOSE 106*  BUN 22*  CREATININE 1.14  CALCIUM 8.3*   ------------------------------------------------------------------------------------------------------------------  Cardiac Enzymes  Recent Labs Lab 01/24/17 0243  TROPONINI 0.04*   ------------------------------------------------------------------------------------------------------------------  RADIOLOGY:  Dg Chest 2 View  Result Date: 01/23/2017 CLINICAL DATA:  Chest pain, shortness of breath, hemoptysis, fever, and elevated blood pressure for the past 4 days. EXAM: CHEST  2 VIEW COMPARISON:  Chest x-ray of September 08, 2026 2013 and left shoulder series of June 12, 2013. FINDINGS: The lungs are reasonably well inflated. There are confluent alveolar opacities in the right upper and right middle lobes and early alveolar changes in the left lower lobe laterally. There is no pleural effusion. The cardiac silhouette is enlarged. The pulmonary vascularity is not engorged. The trachea is midline. The bony thorax is unremarkable. IMPRESSION: Findings most compatible with bilateral pneumonia. Followup PA and lateral chest X-ray is recommended in 3-4 weeks following trial of antibiotic therapy to ensure resolution and exclude underlying malignancy. If the patient's clinical condition worsens in the meantime, chest CT scanning would be recommended. Electronically Signed   By: David  [place] M.D.   On: 01/23/2017  13:06   Ct  Angio Chest Pe W And/or Wo Contrast  Result Date: 01/23/2017 CLINICAL DATA:  Hemoptysis. Hematemesis and dark bloody stool. Symptoms for 1 week. EXAM: CT ANGIOGRAPHY CHEST WITH CONTRAST TECHNIQUE: Multidetector CT imaging of the chest was performed using the standard protocol during bolus administration of intravenous contrast. Multiplanar CT image reconstructions and MIPs were obtained to evaluate the vascular anatomy. CONTRAST:  100 cc Isovue 370 intravenous COMPARISON:  09/11/2008 FINDINGS: Cardiovascular: Satisfactory opacification of the pulmonary arteries to the segmental level. No evidence of pulmonary embolism. Borderline heart size. No pericardial effusion. Mediastinum/Nodes: Prominence of mediastinal and hilar lymph nodes, greater to the right. A right peritracheal node measures 14 mm in short axis. Lungs/Pleura: Diffuse airspace disease with both consolidative and ground-glass features in the right more than left lung. The right lobes are essentially equally affected. There is patchy involvement in the left upper and left lower lobe. Trace right pleural fluid. No septal thickening in areas free of airspace disease. Mild atelectasis in the left lower lobe. No cavitary features. Clear central airways. Given the clinical history, diffuse alveolar hemorrhage is favored. Usually, airspace disease like this would reflect multi lobar pneumonia. Severe acute hypersensitivity pneumonitis can have this appearance, but usually more symmetric and without hemoptysis. Upper Abdomen: No acute finding. Musculoskeletal: No acute or aggressive finding Review of the MIP images confirms the above findings. IMPRESSION: 1. Widespread airspace disease, particularly extensive on the right. Given the clinical history, pattern favors diffuse alveolar hemorrhage over multi lobar pneumonia. No evidence of airway lesion. 2. Mild mediastinal nodal enlargement and trace right pleural fluid, presumably reactive. 3.  Negative for pulmonary embolism. Electronically Signed   By: Marnee Spring M.D.   On: 01/23/2017 14:08     ASSESSMENT AND PLAN:   31 year old male with past medical history of hypertension who presents to the hospital due to shortness of breath cough and hemoptysis.  1. Acute respiratory failure with hypoxia-secondary to multifocal pneumonia. - cont. IV Ceftriaxone, Zithromax.  Cont. O2 supplementation.   2. Pneumonia - suspected to be CAP.  - cont. Ceftriaxone, Zithromax.  Follow cultures.   3. Hemoptysis - likely due to # 2.  - CT chest showing ?? Alveolar hemorrhage but Hg. Stable and pt. Is not profoundly hypoxic.  - will start on IV steroids, Pulmicort nebs.  - get PUlmonary consult.   4. Elevated Trop - due to supply demand ischemia from Hypoxia.  - Trop have not trended up. Appreciate Cards input and no acute intervention.   5. Essential HTN - non-compliant.  - Metoprolol, Lisinopril.  Cont. PRN hydralazine.   All the records are reviewed and case discussed with Care Management/Social Worker. Management plans discussed with the patient, family and they are in agreement.  CODE STATUS: Full code  DVT Prophylaxis: Hep. SQ  TOTAL TIME TAKING CARE OF THIS PATIENT: 30 minutes.   POSSIBLE D/C IN 1-2 DAYS, DEPENDING ON CLINICAL CONDITION.   Houston Siren M.D on 01/24/2017 at 12:59 PM  Between 7am to 6pm - Pager - [phone]  After 6pm go to [email or web address] - Social research officer, government  [facility]  Office  [phone]  CC: Primary care physician; Patrice Paradise, MD

## 2017-01-24 NOTE — Consult Note (Signed)
Name: Benjamin Goodman. MRN: [id number] DOB: [birth year]    ADMISSION DATE:  01/23/2017 CONSULTATION DATE: 01/23/2017  REFERRING MD : Dr. Cherlynn Kaiser  CHIEF COMPLAINT: Shortness of Breath   BRIEF PATIENT DESCRIPTION:  31 yr old male admitted 08/16 with acute hypoxic respiratory failure and hemoptysis secondary to multifocal pneumonia   SIGNIFICANT EVENTS  08/16-Pt admitted to the Stepdown Unit   STUDIES:  CT Angio Chest 08/16>>Widespread airspace disease, particularly extensive on the right. Given the clinical history, pattern favors diffuse alveolar hemorrhage over multi lobar pneumonia. No evidence of airway lesion. Mild mediastinal nodal enlargement and trace right pleural fluid, presumably reactive. Negative for pulmonary embolism. Echo 08/17>>  HISTORY OF PRESENT ILLNESS:   This is a 31 yo male with a PMH of Hypertension and Obesity.  He presented to [facility] ER 08/16 with c/o shortness of breath, cough, and fever onset of symptoms 5 days prior to presentation to the ER.  He recently went to the health department due to symptoms and was tested for TB due to hemoptysis.   According to the pt he was told his TB skin test and Quantiferon Gold results were negative.  He states he took Levaquin for 2 days prior to ER visit.  He went to the walk in clinic 08/16 due to worsening shortness of breath and was referred to the ER due to hypoxia and tachycardia.  The pts fiance was diagnosed with pneumonia 4 weeks ago, they both deny recent travel or exposure to anyone with TB, his fiance is a Manufacturing systems engineer and has had sick contacts.   In the ER CT Angio Chest revealed widespread airspace disease concerning for diffuse alveolar hemorrhage vs. multifocal pneumonia, therefore PCCM consulted.   PAST MEDICAL HISTORY :   has a past medical history of Hypertension.  has a past surgical history that includes Tonsillectomy. Prior to Admission medications   Medication Sig Start Date End Date Taking?  Authorizing Provider  benzonatate (TESSALON) 200 MG capsule Take 200 mg by mouth 3 (three) times daily as needed for cough. 01/21/17 01/28/17 Yes [provider]  guaiFENesin-codeine 100-10 MG/5ML syrup Take 5 mLs by mouth every 4 (four) hours as needed. 01/21/17  Yes [provider]  levofloxacin (LEVAQUIN) 500 MG tablet Take 500 mg by mouth daily. 01/21/17 01/28/17 Yes [provider]  ondansetron (ZOFRAN ODT) 4 MG disintegrating tablet Take 1 tablet (4 mg total) by mouth every 8 (eight) hours as needed for nausea or vomiting. Patient not taking: Reported on 01/23/2017 03/09/15   Renford Dills, NP  ondansetron (ZOFRAN ODT) 8 MG disintegrating tablet Take 1 tablet (8 mg total) by mouth 2 (two) times daily. Patient not taking: Reported on 01/23/2017 03/07/15   Payton Mccallum, MD  oxyCODONE-acetaminophen (ROXICET) 5-325 MG tablet Take 1 tablet by mouth every 8 (eight) hours as needed for moderate pain or severe pain (Do not drive or operate heavy machinery while taking as can cause drowsiness.). Patient not taking: Reported on 01/23/2017 03/09/15   Renford Dills, NP   No Known Allergies  FAMILY HISTORY:  family history is not on file. SOCIAL HISTORY:  reports that he has never smoked. He has never used smokeless tobacco. He reports that he does not drink alcohol or use drugs.  REVIEW OF SYSTEMS: Positives in BOLD  Constitutional: fever, chills, weight loss, malaise/fatigue and diaphoresis.  HENT: Negative for hearing loss, ear pain, nosebleeds, congestion, sore throat, neck pain, tinnitus and ear discharge.   Eyes: Negative for blurred vision,  double vision, photophobia, pain, discharge and redness.  Respiratory: cough, hemoptysis, sputum production, shortness of breath, wheezing and stridor.   Cardiovascular: pleuritic chest pain, palpitations, orthopnea, claudication, leg swelling and PND.  Gastrointestinal: heartburn, nausea, vomiting, abdominal pain, diarrhea,  constipation, blood in stool and melena.  Genitourinary: Negative for dysuria, urgency, frequency, hematuria and flank pain.  Musculoskeletal: Negative for myalgias, back pain, joint pain and falls.  Skin: Negative for itching and rash.  Neurological: Negative for dizziness, tingling, tremors, sensory change, speech change, focal weakness, seizures, loss of consciousness, weakness and headaches.  Endo/Heme/Allergies: Negative for environmental allergies and polydipsia. Does not bruise/bleed easily.  SUBJECTIVE:  Pt states he is still short of breath, however the amount of blood tinged sputum production has decreased  VITAL SIGNS: Temp:  [98.4 F (36.9 C)-100 F (37.8 C)] 98.4 F (36.9 C) (08/17 0818) Pulse Rate:  [98-117] 106 (08/17 0818) Resp:  [18-19] 18 (08/17 0818) BP: (146-178)/(83-114) 172/105 (08/17 0818) SpO2:  [91 %-96 %] 92 % (08/17 0818)  PHYSICAL EXAMINATION: General: well developed, well nourished AA male, NAD Neuro: alert and oriented, follows commands HEENT: supple, no JVD Cardiovascular: sinus tach, s1s2, no M/R/G Lungs: rhonchi throughout, even, non labored  Abdomen: +BS x4, soft, obese, non tender, non distended Musculoskeletal: normal bulk and tone, no edema  Skin: intact no rashes or lesions    Recent Labs Lab 01/23/17 1222 01/24/17 0243  NA 139 138  K 3.0* 3.2*  CL 101 102  CO2 29 26  BUN 22* 22*  CREATININE 1.34* 1.14  GLUCOSE 101* 106*    Recent Labs Lab 01/23/17 1222 01/24/17 0243  HGB 15.0 14.6  HCT 44.0 43.5  WBC 5.8 8.4  PLT 152 166   Dg Chest 2 View  Result Date: 01/23/2017 CLINICAL DATA:  Chest pain, shortness of breath, hemoptysis, fever, and elevated blood pressure for the past 4 days. EXAM: CHEST  2 VIEW COMPARISON:  Chest x-ray of September 08, 2026 2013 and left shoulder series of June 12, 2013. FINDINGS: The lungs are reasonably well inflated. There are confluent alveolar opacities in the right upper and right middle lobes and  early alveolar changes in the left lower lobe laterally. There is no pleural effusion. The cardiac silhouette is enlarged. The pulmonary vascularity is not engorged. The trachea is midline. The bony thorax is unremarkable. IMPRESSION: Findings most compatible with bilateral pneumonia. Followup PA and lateral chest X-ray is recommended in 3-4 weeks following trial of antibiotic therapy to ensure resolution and exclude underlying malignancy. If the patient's clinical condition worsens in the meantime, chest CT scanning would be recommended. Electronically Signed   By: David  [place] M.D.   On: 01/23/2017 13:06   Ct Angio Chest Pe W And/or Wo Contrast  Result Date: 01/23/2017 CLINICAL DATA:  Hemoptysis. Hematemesis and dark bloody stool. Symptoms for 1 week. EXAM: CT ANGIOGRAPHY CHEST WITH CONTRAST TECHNIQUE: Multidetector CT imaging of the chest was performed using the standard protocol during bolus administration of intravenous contrast. Multiplanar CT image reconstructions and MIPs were obtained to evaluate the vascular anatomy. CONTRAST:  100 cc Isovue 370 intravenous COMPARISON:  09/11/2008 FINDINGS: Cardiovascular: Satisfactory opacification of the pulmonary arteries to the segmental level. No evidence of pulmonary embolism. Borderline heart size. No pericardial effusion. Mediastinum/Nodes: Prominence of mediastinal and hilar lymph nodes, greater to the right. A right peritracheal node measures 14 mm in short axis. Lungs/Pleura: Diffuse airspace disease with both consolidative and ground-glass features in the right more than left lung. The right  lobes are essentially equally affected. There is patchy involvement in the left upper and left lower lobe. Trace right pleural fluid. No septal thickening in areas free of airspace disease. Mild atelectasis in the left lower lobe. No cavitary features. Clear central airways. Given the clinical history, diffuse alveolar hemorrhage is favored. Usually, airspace disease  like this would reflect multi lobar pneumonia. Severe acute hypersensitivity pneumonitis can have this appearance, but usually more symmetric and without hemoptysis. Upper Abdomen: No acute finding. Musculoskeletal: No acute or aggressive finding Review of the MIP images confirms the above findings. IMPRESSION: 1. Widespread airspace disease, particularly extensive on the right. Given the clinical history, pattern favors diffuse alveolar hemorrhage over multi lobar pneumonia. No evidence of airway lesion. 2. Mild mediastinal nodal enlargement and trace right pleural fluid, presumably reactive. 3. Negative for pulmonary embolism. Electronically Signed   By: Marnee Spring M.D.   On: 01/23/2017 14:08    ASSESSMENT / PLAN: Acute hypoxic respiratory failure and hemoptysis secondary to multifocal pneumonia  Pleuritic chest pain  Elevated troponin's likely secondary to demand ischemia due to respiratory failure Hx: Obesity P: Supplemental O2 for dyspnea and to maintain O2 sats >92% Repeat CXR in am  Scheduled and prn bronchodilator therapy Nebulized and IV steroids Trend WBC and monitor fever curve Trend PCT Continue current abx   Sputum culture pending  Trend troponin's  Cardiology consulted appreciate input  Prn percocet for pain management   Sonda Rumble, AGNP  Pulmonary/Critical Care Pager [phone] (please enter 7 digits) PCCM Consult Pager [phone] (please enter 7 digits)

## 2017-01-25 ENCOUNTER — Inpatient Hospital Stay: Payer: 59

## 2017-01-25 LAB — CBC
HCT: 38.1 % — ABNORMAL LOW (ref 40.0–52.0)
Hemoglobin: 12.9 g/dL — ABNORMAL LOW (ref 13.0–18.0)
MCH: 28 pg (ref 26.0–34.0)
MCHC: 33.9 g/dL (ref 32.0–36.0)
MCV: 82.4 fL (ref 80.0–100.0)
PLATELETS: 181 10*3/uL (ref 150–440)
RBC: 4.62 MIL/uL (ref 4.40–5.90)
RDW: 15.7 % — AB (ref 11.5–14.5)
WBC: 12.4 10*3/uL — AB (ref 3.8–10.6)

## 2017-01-25 LAB — GLUCOSE, CAPILLARY: Glucose-Capillary: 103 mg/dL — ABNORMAL HIGH (ref 65–99)

## 2017-01-25 MED ORDER — ALUM & MAG HYDROXIDE-SIMETH 200-200-20 MG/5ML PO SUSP
15.0000 mL | ORAL | Status: DC | PRN
  Administered 2017-01-25: 15 mL via ORAL
  Filled 2017-01-25: qty 30

## 2017-01-25 MED ORDER — SODIUM CHLORIDE 0.9% FLUSH
3.0000 mL | Freq: Two times a day (BID) | INTRAVENOUS | Status: DC
  Administered 2017-01-25 – 2017-01-26 (×3): 3 mL via INTRAVENOUS

## 2017-01-25 NOTE — Progress Notes (Signed)
[facility] [place] [facility]   PATIENT NAME: Benjamin Goodman    MR#:  [id number]  DATE OF BIRTH:  1986/01/12  SUBJECTIVE:   Patient here due to shortness of breath and hemoptysis and suspected to have multifocal pneumonia. Still feels short of breath with productive cough which is blood tinged in nature. Hemoglobin stable.  REVIEW OF SYSTEMS:    Review of Systems  Constitutional: Negative for chills and fever.  HENT: Negative for congestion and tinnitus.   Eyes: Negative for blurred vision and double vision.  Respiratory: Positive for cough, hemoptysis and shortness of breath. Negative for wheezing.   Cardiovascular: Negative for chest pain, orthopnea and PND.  Gastrointestinal: Negative for abdominal pain, diarrhea, nausea and vomiting.  Genitourinary: Negative for dysuria and hematuria.  Neurological: Negative for dizziness, sensory change and focal weakness.  All other systems reviewed and are negative.   Nutrition: Heart healthy Tolerating Diet: Yes Tolerating PT: Ambulatory  DRUG ALLERGIES:  No Known Allergies  VITALS:  Blood pressure 136/79, pulse 97, temperature 98.2 F (36.8 C), temperature source Oral, resp. rate 18, height 5\' 6"  (1.676 m), weight 124.3 kg (274 lb 1.6 oz), SpO2 94 %.  PHYSICAL EXAMINATION:   Physical Exam  GENERAL:  31 y.o.-year-old patient lying in bed in mild Resp. Distress.   EYES: Pupils equal, round, reactive to light and accommodation. No scleral icterus. Extraocular muscles intact.  HEENT: Head atraumatic, normocephalic. Oropharynx and nasopharynx clear.  NECK:  Supple, no jugular venous distention. No thyroid enlargement, no tenderness.  LUNGS: Good a/e B/l, wheezing/rhonchi on right side > left. No use of accessory muscles of respiration.  CARDIOVASCULAR: S1, S2 normal. No murmurs, rubs, or gallops.  ABDOMEN: Soft, nontender, nondistended. Bowel sounds present. No organomegaly or mass.  EXTREMITIES: No cyanosis,  clubbing or edema b/l.    NEUROLOGIC: Cranial nerves II through XII are intact. No focal Motor or sensory deficits b/l.   PSYCHIATRIC: The patient is alert and oriented x 3.  SKIN: No obvious rash, lesion, or ulcer.    LABORATORY PANEL:   CBC  Recent Labs Lab 01/25/17 0540  WBC 12.4*  HGB 12.9*  HCT 38.1*  PLT 181   ------------------------------------------------------------------------------------------------------------------  Chemistries   Recent Labs Lab 01/24/17 0243 01/24/17 1245  NA 138  --   K 3.2*  --   CL 102  --   CO2 26  --   GLUCOSE 106*  --   BUN 22*  --   CREATININE 1.14  --   CALCIUM 8.3*  --   MG  --  2.1   ------------------------------------------------------------------------------------------------------------------  Cardiac Enzymes  Recent Labs Lab 01/24/17 0243  TROPONINI 0.04*   ------------------------------------------------------------------------------------------------------------------  RADIOLOGY:  Ct Angio Chest Pe W And/or Wo Contrast  Result Date: 01/23/2017 CLINICAL DATA:  Hemoptysis. Hematemesis and dark bloody stool. Symptoms for 1 week. EXAM: CT ANGIOGRAPHY CHEST WITH CONTRAST TECHNIQUE: Multidetector CT imaging of the chest was performed using the standard protocol during bolus administration of intravenous contrast. Multiplanar CT image reconstructions and MIPs were obtained to evaluate the vascular anatomy. CONTRAST:  100 cc Isovue 370 intravenous COMPARISON:  09/11/2008 FINDINGS: Cardiovascular: Satisfactory opacification of the pulmonary arteries to the segmental level. No evidence of pulmonary embolism. Borderline heart size. No pericardial effusion. Mediastinum/Nodes: Prominence of mediastinal and hilar lymph nodes, greater to the right. A right peritracheal node measures 14 mm in short axis. Lungs/Pleura: Diffuse airspace disease with both consolidative and ground-glass features in the right more than left lung.  The right  lobes are essentially equally affected. There is patchy involvement in the left upper and left lower lobe. Trace right pleural fluid. No septal thickening in areas free of airspace disease. Mild atelectasis in the left lower lobe. No cavitary features. Clear central airways. Given the clinical history, diffuse alveolar hemorrhage is favored. Usually, airspace disease like this would reflect multi lobar pneumonia. Severe acute hypersensitivity pneumonitis can have this appearance, but usually more symmetric and without hemoptysis. Upper Abdomen: No acute finding. Musculoskeletal: No acute or aggressive finding Review of the MIP images confirms the above findings. IMPRESSION: 1. Widespread airspace disease, particularly extensive on the right. Given the clinical history, pattern favors diffuse alveolar hemorrhage over multi lobar pneumonia. No evidence of airway lesion. 2. Mild mediastinal nodal enlargement and trace right pleural fluid, presumably reactive. 3. Negative for pulmonary embolism. Electronically Signed   By: Marnee Spring M.D.   On: 01/23/2017 14:08   Dg Chest Port 1 View  Result Date: 01/25/2017 CLINICAL DATA:  Acute onset of shortness of breath and generalized chest pain. Initial encounter. EXAM: PORTABLE CHEST 1 VIEW COMPARISON:  Chest radiograph and CT of the chest performed 01/23/2017 FINDINGS: The lungs are well-aerated. Persistent bilateral pneumonia is noted, more prominent on the right. This is perhaps slightly worsened from the prior study. No pleural effusion or pneumothorax is seen. The cardiomediastinal silhouette is mildly enlarged. No acute osseous abnormalities are seen. IMPRESSION: 1. Persistent bilateral pneumonia, more prominent on the right. This is perhaps slightly worsened from the prior study. 2. Mild cardiomegaly. Electronically Signed   By: Roanna Raider M.D.   On: 01/25/2017 04:59     ASSESSMENT AND PLAN:   31 year old male with past medical history of hypertension  who presents to the hospital due to shortness of breath cough and hemoptysis.  1. Acute respiratory failure with hypoxia-secondary to multifocal pneumonia. - cont. IV Ceftriaxone, Zithromax.  Cont. O2 supplementation.   2. Pneumonia - suspected to be CAP.  - cont. Ceftriaxone, Zithromax.  Follow cultures negative  3. Hemoptysis - likely due to # 2.  - CT chest showing ?? Alveolar hemorrhage but Hg. Stable and pt. Is not profoundly hypoxic.  -  on IV steroids, Pulmicort nebs.  - PUlmonary consult appreciated  4. Elevated Trop - due to supply demand ischemia from Hypoxia.  - Trop have not trended up. Appreciate Cards input and no acute intervention.   5. Essential HTN - non-compliant.  - Metoprolol, Lisinopril.  Cont. PRN hydralazine.   All the records are reviewed and case discussed with Care Management/Social Worker. Management plans discussed with the patient, family and they are in agreement.  CODE STATUS: Full code  DVT Prophylaxis: Hep. SQ  TOTAL TIME TAKING CARE OF THIS PATIENT: 30 minutes.   POSSIBLE D/C IN 1-2 DAYS, DEPENDING ON CLINICAL CONDITION.   Zawadi Aplin M.D on 01/25/2017 at 1:09 PM  Between 7am to 6pm - Pager - [phone]  After 6pm go to [email or web address] - Social research officer, government  [facility]  Office  [phone]  CC: Primary care physician; Patrice Paradise, MD

## 2017-01-25 NOTE — Progress Notes (Signed)
SATURATION QUALIFICATIONS: (This note is used to comply with regulatory documentation for home oxygen)  Patient Saturations on Room Air at Rest =94%  Patient Saturations on Room Air while Ambulating =90%  Patient Saturations on **3* Liters of oxygen while Ambulating = 94  Please briefly explain why patient needs home oxygen:

## 2017-01-25 NOTE — Progress Notes (Signed)
[facility]  SUBJECTIVE: The patient appears to be clinically improved, denies chest pain   Vitals:   01/24/17 2102 01/25/17 0255 01/25/17 0457 01/25/17 0735  BP:   136/79   Pulse:   97   Resp:   18   Temp:   98.2 F (36.8 C)   TempSrc:   Oral   SpO2: 95% 95% 92% 94%  Weight:   124.3 kg (274 lb 1.6 oz)   Height:         Intake/Output Summary (Last 24 hours) at 01/25/17 1021 Last data filed at 01/25/17 0900  Gross per 24 hour  Intake              350 ml  Output                0 ml  Net              350 ml      PHYSICAL EXAM  General: Well developed, well nourished, in no acute distress HEENT:  Normocephalic and atramatic Neck:  No JVD.  Lungs: Clear bilaterally to auscultation and percussion. Heart: HRRR . Normal S1 and S2 without gallops or murmurs.  Abdomen: Bowel sounds are positive, abdomen soft and non-tender  Msk:  Back normal, normal gait. Normal strength and tone for age. Extremities: No clubbing, cyanosis or edema.   Neuro: Alert and oriented X 3. Psych:  Good affect, responds appropriately   LABS: Basic Metabolic Panel:  Recent Labs  62/13/08 1222 01/24/17 0243 01/24/17 1245  NA 139 138  --   K 3.0* 3.2*  --   CL 101 102  --   CO2 29 26  --   GLUCOSE 101* 106*  --   BUN 22* 22*  --   CREATININE 1.34* 1.14  --   CALCIUM 8.8* 8.3*  --   MG  --   --  2.1   Liver Function Tests: No results for input(s): AST, ALT, ALKPHOS, BILITOT, PROT, ALBUMIN in the last 72 hours. No results for input(s): LIPASE, AMYLASE in the last 72 hours. CBC:  Recent Labs  01/24/17 0243 01/25/17 0540  WBC 8.4 12.4*  HGB 14.6 12.9*  HCT 43.5 38.1*  MCV 82.0 82.4  PLT 166 181   Cardiac Enzymes:  Recent Labs  01/23/17 1654 01/23/17 2033 01/24/17 0243  TROPONINI 0.04* 0.04* 0.04*   BNP: Invalid input(s): POCBNP D-Dimer: No results for input(s): DDIMER in the last 72 hours. Hemoglobin A1C: No results for input(s): HGBA1C in the last 72 hours. Fasting  Lipid Panel:  Recent Labs  01/24/17 0243  CHOL 162  HDL 12*  LDLCALC 79  TRIG 657*  CHOLHDL 13.5   Thyroid Function Tests: No results for input(s): TSH, T4TOTAL, T3FREE, THYROIDAB in the last 72 hours.  Invalid input(s): FREET3 Anemia Panel: No results for input(s): VITAMINB12, FOLATE, FERRITIN, TIBC, IRON, RETICCTPCT in the last 72 hours.  Dg Chest 2 View  Result Date: 01/23/2017 CLINICAL DATA:  Chest pain, shortness of breath, hemoptysis, fever, and elevated blood pressure for the past 4 days. EXAM: CHEST  2 VIEW COMPARISON:  Chest x-ray of September 08, 2026 2013 and left shoulder series of June 12, 2013. FINDINGS: The lungs are reasonably well inflated. There are confluent alveolar opacities in the right upper and right middle lobes and early alveolar changes in the left lower lobe laterally. There is no pleural effusion. The cardiac silhouette is enlarged. The pulmonary vascularity is not engorged. The trachea is midline.  The bony thorax is unremarkable. IMPRESSION: Findings most compatible with bilateral pneumonia. Followup PA and lateral chest X-ray is recommended in 3-4 weeks following trial of antibiotic therapy to ensure resolution and exclude underlying malignancy. If the patient's clinical condition worsens in the meantime, chest CT scanning would be recommended. Electronically Signed   By: David  [place] M.D.   On: 01/23/2017 13:06   Ct Angio Chest Pe W And/or Wo Contrast  Result Date: 01/23/2017 CLINICAL DATA:  Hemoptysis. Hematemesis and dark bloody stool. Symptoms for 1 week. EXAM: CT ANGIOGRAPHY CHEST WITH CONTRAST TECHNIQUE: Multidetector CT imaging of the chest was performed using the standard protocol during bolus administration of intravenous contrast. Multiplanar CT image reconstructions and MIPs were obtained to evaluate the vascular anatomy. CONTRAST:  100 cc Isovue 370 intravenous COMPARISON:  09/11/2008 FINDINGS: Cardiovascular: Satisfactory opacification of the  pulmonary arteries to the segmental level. No evidence of pulmonary embolism. Borderline heart size. No pericardial effusion. Mediastinum/Nodes: Prominence of mediastinal and hilar lymph nodes, greater to the right. A right peritracheal node measures 14 mm in short axis. Lungs/Pleura: Diffuse airspace disease with both consolidative and ground-glass features in the right more than left lung. The right lobes are essentially equally affected. There is patchy involvement in the left upper and left lower lobe. Trace right pleural fluid. No septal thickening in areas free of airspace disease. Mild atelectasis in the left lower lobe. No cavitary features. Clear central airways. Given the clinical history, diffuse alveolar hemorrhage is favored. Usually, airspace disease like this would reflect multi lobar pneumonia. Severe acute hypersensitivity pneumonitis can have this appearance, but usually more symmetric and without hemoptysis. Upper Abdomen: No acute finding. Musculoskeletal: No acute or aggressive finding Review of the MIP images confirms the above findings. IMPRESSION: 1. Widespread airspace disease, particularly extensive on the right. Given the clinical history, pattern favors diffuse alveolar hemorrhage over multi lobar pneumonia. No evidence of airway lesion. 2. Mild mediastinal nodal enlargement and trace right pleural fluid, presumably reactive. 3. Negative for pulmonary embolism. Electronically Signed   By: Marnee Spring M.D.   On: 01/23/2017 14:08   Dg Chest Port 1 View  Result Date: 01/25/2017 CLINICAL DATA:  Acute onset of shortness of breath and generalized chest pain. Initial encounter. EXAM: PORTABLE CHEST 1 VIEW COMPARISON:  Chest radiograph and CT of the chest performed 01/23/2017 FINDINGS: The lungs are well-aerated. Persistent bilateral pneumonia is noted, more prominent on the right. This is perhaps slightly worsened from the prior study. No pleural effusion or pneumothorax is seen. The  cardiomediastinal silhouette is mildly enlarged. No acute osseous abnormalities are seen. IMPRESSION: 1. Persistent bilateral pneumonia, more prominent on the right. This is perhaps slightly worsened from the prior study. 2. Mild cardiomegaly. Electronically Signed   By: Roanna Raider M.D.   On: 01/25/2017 04:59     Echo normal LV function  TELEMETRY: Normal sinus rhythm:  ASSESSMENT AND PLAN:  Active Problems:   Acute respiratory failure with hypoxia (HCC)    1. Pleuritic chest pain, secondary to multi lobar pneumonia 2. Borderline elevated troponin without trend, likely demand supply ischemia, not due to acute coronary syndrome 3. Multi lobar pneumonia  Recommendations  1. Agree with current therapy 2. Defer full dose anticoagulation 3. Defer further cardiac diagnostics at this time  Signed off for now, please call if any questions   Marcina Millard, MD, PhD, [facility] 01/25/2017 10:21 AM

## 2017-01-26 ENCOUNTER — Encounter: Payer: Self-pay | Admitting: *Deleted

## 2017-01-26 LAB — GLUCOSE, CAPILLARY: Glucose-Capillary: 98 mg/dL (ref 65–99)

## 2017-01-26 LAB — PROCALCITONIN: PROCALCITONIN: 2 ng/mL

## 2017-01-26 MED ORDER — HYDRALAZINE HCL 50 MG PO TABS
50.0000 mg | ORAL_TABLET | Freq: Three times a day (TID) | ORAL | Status: DC
  Administered 2017-01-26 – 2017-01-27 (×4): 50 mg via ORAL
  Filled 2017-01-26 (×4): qty 1

## 2017-01-26 MED ORDER — LISINOPRIL 10 MG PO TABS
10.0000 mg | ORAL_TABLET | Freq: Every day | ORAL | Status: DC
  Administered 2017-01-26 – 2017-01-27 (×2): 10 mg via ORAL
  Filled 2017-01-26 (×2): qty 1

## 2017-01-26 MED ORDER — AZITHROMYCIN 250 MG PO TABS
250.0000 mg | ORAL_TABLET | Freq: Every day | ORAL | Status: DC
  Administered 2017-01-26 – 2017-01-27 (×2): 250 mg via ORAL
  Filled 2017-01-26 (×2): qty 1

## 2017-01-26 NOTE — Progress Notes (Signed)
VS reviewed Continue abx and steroids as prescribed Oxygen as needed  No need for bronch at this time.     Lucie Leather, M.D.  [facility]  Medical Director [facility] Medical Director [facility] Cardio-P[facility] Department

## 2017-01-26 NOTE — Progress Notes (Signed)
[facility] - [place] at [facility]   PATIENT NAME: Benjamin Goodman    MR#:  [id number]  DATE OF BIRTH:  1986/03/07  SUBJECTIVE:   Patient here due to shortness of breath and hemoptysis and suspected to have multifocal pneumonia. Still feels short of breath with productive cough which is blood tinged in nature. Hemoglobin stable.  REVIEW OF SYSTEMS:    Review of Systems  Constitutional: Negative for chills and fever.  HENT: Negative for congestion and tinnitus.   Eyes: Negative for blurred vision and double vision.  Respiratory: Positive for cough, hemoptysis and shortness of breath. Negative for wheezing.   Cardiovascular: Negative for chest pain, orthopnea and PND.  Gastrointestinal: Negative for abdominal pain, diarrhea, nausea and vomiting.  Genitourinary: Negative for dysuria and hematuria.  Neurological: Negative for dizziness, sensory change and focal weakness.  All other systems reviewed and are negative.   Nutrition: Heart healthy Tolerating Diet: Yes Tolerating PT: Ambulatory  DRUG ALLERGIES:  No Known Allergies  VITALS:  Blood pressure (!) 174/108, pulse (!) 105, temperature 98 F (36.7 C), temperature source Oral, resp. rate (!) 30, height 5\' 6"  (1.676 m), weight 124.6 kg (274 lb 11.2 oz), SpO2 96 %.  PHYSICAL EXAMINATION:   Physical Exam  GENERAL:  31 y.o.-year-old patient lying in bed in mild Resp. obese EYES: Pupils equal, round, reactive to light and accommodation. No scleral icterus. Extraocular muscles intact.  HEENT: Head atraumatic, normocephalic. Oropharynx and nasopharynx clear.  NECK:  Supple, no jugular venous distention. No thyroid enlargement, no tenderness.  LUNGS: Good a/e B/l, wheezing/rhonchi on right side > left. No use of accessory muscles of respiration.  CARDIOVASCULAR: S1, S2 normal. No murmurs, rubs, or gallops.  ABDOMEN: Soft, nontender, nondistended. Bowel sounds present. No organomegaly or mass.  EXTREMITIES: No  cyanosis, clubbing or edema b/l.    NEUROLOGIC: Cranial nerves II through XII are intact. No focal Motor or sensory deficits b/l.   PSYCHIATRIC: The patient is alert and oriented x 3.  SKIN: No obvious rash, lesion, or ulcer.    LABORATORY PANEL:   CBC  Recent Labs Lab 01/25/17 0540  WBC 12.4*  HGB 12.9*  HCT 38.1*  PLT 181   ------------------------------------------------------------------------------------------------------------------  Chemistries   Recent Labs Lab 01/24/17 0243 01/24/17 1245  NA 138  --   K 3.2*  --   CL 102  --   CO2 26  --   GLUCOSE 106*  --   BUN 22*  --   CREATININE 1.14  --   CALCIUM 8.3*  --   MG  --  2.1   ------------------------------------------------------------------------------------------------------------------  Cardiac Enzymes  Recent Labs Lab 01/24/17 0243  TROPONINI 0.04*   ------------------------------------------------------------------------------------------------------------------  RADIOLOGY:  Dg Chest Port 1 View  Result Date: 01/25/2017 CLINICAL DATA:  Acute onset of shortness of breath and generalized chest pain. Initial encounter. EXAM: PORTABLE CHEST 1 VIEW COMPARISON:  Chest radiograph and CT of the chest performed 01/23/2017 FINDINGS: The lungs are well-aerated. Persistent bilateral pneumonia is noted, more prominent on the right. This is perhaps slightly worsened from the prior study. No pleural effusion or pneumothorax is seen. The cardiomediastinal silhouette is mildly enlarged. No acute osseous abnormalities are seen. IMPRESSION: 1. Persistent bilateral pneumonia, more prominent on the right. This is perhaps slightly worsened from the prior study. 2. Mild cardiomegaly. Electronically Signed   By: Roanna Raider M.D.   On: 01/25/2017 04:59     ASSESSMENT AND PLAN:   31 year old male with past medical history  of hypertension who presents to the hospital due to shortness of breath cough and hemoptysis.  1.  Acute respiratory failure with hypoxia-secondary to multifocal pneumonia. - cont. IV Ceftriaxone, Zithromax.  Cont. O2 supplementation.   2. Pneumonia - suspected to be CAP.  - cont. Ceftriaxone, Zithromax.  Follow up blood cultures negative  3. Hemoptysis - likely due to # 2.  - CT chest showing ?? Alveolar hemorrhage but Hg. Stable and pt. Is not profoundly hypoxic.  -  on IV steroids, Pulmicort nebs.  - PUlmonary consult appreciated  4. Elevated Trop - due to supply demand ischemia from Hypoxia.  - Trop have not trended up. Appreciate Cards input and no acute intervention.   5. Essential HTN - non-compliant.  - Metoprolol, Lisinopril.  Cont. PRN hydralazine.   6. Suspected sleep apnea Patient recommended to get sleep study as outpatient.  Discussed with family in the room.  All the records are reviewed and case discussed with Care Management/Social Worker. Management plans discussed with the patient, family and they are in agreement.  CODE STATUS: Full code  DVT Prophylaxis: TEDS/SCD  TOTAL TIME TAKING CARE OF THIS PATIENT: 30 minutes.   POSSIBLE D/C IN 1-2 DAYS, DEPENDING ON CLINICAL CONDITION.   Katerina Zurn M.D on 01/26/2017 at 11:42 AM  Between 7am to 6pm - Pager - [phone]  After 6pm go to [email or web address] - Social research officer, government  [facility]  Hospitalists  Office  [phone]  CC: Primary care physician; Patrice Paradise, MD

## 2017-01-27 LAB — CULTURE, RESPIRATORY: CULTURE: NORMAL

## 2017-01-27 LAB — CULTURE, RESPIRATORY W GRAM STAIN

## 2017-01-27 MED ORDER — LISINOPRIL 20 MG PO TABS: 20.0000 mg | ORAL_TABLET | Freq: Every day | ORAL | 2 refills | Status: DC

## 2017-01-27 MED ORDER — LEVOFLOXACIN 500 MG PO TABS: 500.0000 mg | ORAL_TABLET | Freq: Every day | ORAL | Status: DC

## 2017-01-27 MED ORDER — METOPROLOL TARTRATE 50 MG PO TABS: 50.0000 mg | ORAL_TABLET | Freq: Two times a day (BID) | ORAL | 2 refills | Status: DC

## 2017-01-27 MED ORDER — ALBUTEROL SULFATE HFA 108 (90 BASE) MCG/ACT IN AERS: 2.0000 | INHALATION_SPRAY | Freq: Four times a day (QID) | RESPIRATORY_TRACT | 2 refills | Status: DC | PRN

## 2017-01-27 MED ORDER — CEFUROXIME AXETIL 500 MG PO TABS
500.0000 mg | ORAL_TABLET | Freq: Two times a day (BID) | ORAL | Status: DC
  Administered 2017-01-27: 500 mg via ORAL
  Filled 2017-01-27 (×2): qty 1

## 2017-01-27 MED ORDER — LEVOFLOXACIN 500 MG PO TABS: 500.0000 mg | ORAL_TABLET | Freq: Every day | ORAL | 0 refills | Status: AC

## 2017-01-27 MED ORDER — PREDNISONE 50 MG PO TABS
60.0000 mg | ORAL_TABLET | Freq: Every day | ORAL | Status: DC
  Administered 2017-01-27: 09:00:00 60 mg via ORAL
  Filled 2017-01-27: qty 1

## 2017-01-27 MED ORDER — HYDRALAZINE HCL 20 MG/ML IJ SOLN
10.0000 mg | Freq: Four times a day (QID) | INTRAMUSCULAR | Status: DC | PRN
  Administered 2017-01-27: 10 mg via INTRAVENOUS
  Filled 2017-01-27: qty 1

## 2017-01-27 MED ORDER — METOPROLOL TARTRATE 50 MG PO TABS: 50.0000 mg | ORAL_TABLET | Freq: Two times a day (BID) | ORAL | Status: DC

## 2017-01-27 MED ORDER — HYDRALAZINE HCL 50 MG PO TABS: 50.0000 mg | ORAL_TABLET | Freq: Three times a day (TID) | ORAL | 1 refills | Status: DC

## 2017-01-27 MED ORDER — LISINOPRIL 20 MG PO TABS
20.0000 mg | ORAL_TABLET | Freq: Every day | ORAL | Status: DC
Start: 2017-01-28 — End: 2017-01-27

## 2017-01-27 MED ORDER — LEVOFLOXACIN 750 MG PO TABS: 750.0000 mg | ORAL_TABLET | Freq: Every day | ORAL | Status: DC

## 2017-01-27 MED ORDER — CLONIDINE HCL 0.1 MG PO TABS
0.1000 mg | ORAL_TABLET | Freq: Two times a day (BID) | ORAL | Status: DC
  Administered 2017-01-27: 0.1 mg via ORAL

## 2017-01-27 NOTE — Progress Notes (Signed)
Patient has significant resistant high blood pressure. Adjusted his medications since morning. Blood pressure still elevated and will add clonidine 0.1 mg twice a day.

## 2017-01-27 NOTE — Discharge Summary (Signed)
[facility] - [place] at [facility]   PATIENT NAME: Benjamin Goodman    MR#:  [id number]  DATE OF BIRTH:  Oct 24, 1985  DATE OF ADMISSION:  01/23/2017 ADMITTING PHYSICIAN: Katha Hamming, MD  DATE OF DISCHARGE: 01/27/17  PRIMARY CARE PHYSICIAN: Patrice Paradise, MD    ADMISSION DIAGNOSIS:  Hypoxia [R09.02] Sepsis, due to unspecified organism (HCC) [A41.9] Community acquired pneumonia, unspecified laterality [J18.9] Acute respiratory failure with hypoxia (HCC) [J96.01]  DISCHARGE DIAGNOSIS:  Sepsis on admission Acute hypoxic resp failure due to multifocal Pneumonia Malignant HT Obesity Suspected OSA (w/u as out pt)  SECONDARY DIAGNOSIS:   Past Medical History:  Diagnosis Date  . Hypertension     HOSPITAL COURSE:   31 year old male with past medical history of hypertension who presents to the hospital due to shortness of breath cough and hemoptysis.  1. Acute respiratory failure with hypoxia-secondary to multifocal pneumonia. - cont. IV Ceftriaxone, Zithromax---Levaquin - Cont. O2 supplementation.   2. Pneumonia - suspected to be CAP.  - cont. Ceftriaxone, Zithromax.---po levaquin   - blood cultures negative  3. Hemoptysis - likely due to # 2.  - CT chest showing ?? Alveolar hemorrhage but Hg. Stable and pt. Is not profoundly hypoxic.  -  was on IV steroids, Pulmicort nebs. Per Dr Sharol Harness no need for out pt steroids - PUlmonary consult appreciated -incentive spirometer -sats >92% on RA  4. Elevated Trop - due to supply demand ischemia from Hypoxia.  - Trop have not trended up. Appreciate Cards input and no acute intervention.   5. malignantl HTN - non-compliant.  - Metoprolol, Lisinopril, hydralazine 9 Cont. PRN hydralazine.   6. Suspected sleep apnea Patient recommended to get sleep study as outpatient  Overall improving D/c home CONSULTS OBTAINED:  Treatment Team:  Marcina Millard, MD Leanora Ivanoff, PA-C  DRUG  ALLERGIES:  No Known Allergies  DISCHARGE MEDICATIONS:   Current Discharge Medication List    START taking these medications   Details  albuterol (PROVENTIL HFA;VENTOLIN HFA) 108 (90 Base) MCG/ACT inhaler Inhale 2 puffs into the lungs every 6 (six) hours as needed for wheezing or shortness of breath. Qty: 1 Inhaler, Refills: 2    hydrALAZINE (APRESOLINE) 50 MG tablet Take 1 tablet (50 mg total) by mouth every 8 (eight) hours. Qty: 90 tablet, Refills: 1    lisinopril (PRINIVIL,ZESTRIL) 20 MG tablet Take 1 tablet (20 mg total) by mouth daily. Qty: 30 tablet, Refills: 2    metoprolol tartrate (LOPRESSOR) 50 MG tablet Take 1 tablet (50 mg total) by mouth 2 (two) times daily. Qty: 60 tablet, Refills: 2      CONTINUE these medications which have CHANGED   Details  levofloxacin (LEVAQUIN) 500 MG tablet Take 1 tablet (500 mg total) by mouth daily. Qty: 8 tablet, Refills: 0      CONTINUE these medications which have NOT CHANGED   Details  benzonatate (TESSALON) 200 MG capsule Take 200 mg by mouth 3 (three) times daily as needed for cough.    guaiFENesin-codeine 100-10 MG/5ML syrup Take 5 mLs by mouth every 4 (four) hours as needed.    ondansetron (ZOFRAN ODT) 4 MG disintegrating tablet Take 1 tablet (4 mg total) by mouth every 8 (eight) hours as needed for nausea or vomiting. Qty: 15 tablet, Refills: 0    oxyCODONE-acetaminophen (ROXICET) 5-325 MG tablet Take 1 tablet by mouth every 8 (eight) hours as needed for moderate pain or severe pain (Do not drive or operate heavy machinery while taking as  can cause drowsiness.). Qty: 9 tablet, Refills: 0        If you experience worsening of your admission symptoms, develop shortness of breath, life threatening emergency, suicidal or homicidal thoughts you must seek medical attention immediately by calling 911 or calling your MD immediately  if symptoms less severe.  You Must read complete instructions/literature along with all the  possible adverse reactions/side effects for all the Medicines you take and that have been prescribed to you. Take any new Medicines after you have completely understood and accept all the possible adverse reactions/side effects.   Please note  You were cared for by a hospitalist during your hospital stay. If you have any questions about your discharge medications or the care you received while you were in the hospital after you are discharged, you can call the unit and asked to speak with the hospitalist on call if the hospitalist that took care of you is not available. Once you are discharged, your primary care physician will handle any further medical issues. Please note that NO REFILLS for any discharge medications will be authorized once you are discharged, as it is imperative that you return to your primary care physician (or establish a relationship with a primary care physician if you do not have one) for your aftercare needs so that they can reassess your need for medications and monitor your lab values. Today   SUBJECTIVE   Doing ok  VITAL SIGNS:  Blood pressure (!) 160/99, pulse (!) 114, temperature 98.3 F (36.8 C), temperature source Oral, resp. rate (!) 28, height 5\' 6"  (1.676 m), weight 123.1 kg (271 lb 4.8 oz), SpO2 93 %.  I/O:    Intake/Output Summary (Last 24 hours) at 01/27/17 1639 Last data filed at 01/27/17 1342  Gross per 24 hour  Intake              723 ml  Output              550 ml  Net              173 ml    PHYSICAL EXAMINATION:  GENERAL:  31 y.o.-year-old patient lying in the bed with no acute distress. obese EYES: Pupils equal, round, reactive to light and accommodation. No scleral icterus. Extraocular muscles intact.  HEENT: Head atraumatic, normocephalic. Oropharynx and nasopharynx clear.  NECK:  Supple, no jugular venous distention. No thyroid enlargement, no tenderness.  LUNGS: decreased breath sounds bases bilaterally, no wheezing, rales,rhonchi or  crepitation. No use of accessory muscles of respiration.  CARDIOVASCULAR: S1, S2 normal. No murmurs, rubs, or gallops.  ABDOMEN: Soft, non-tender, non-distended. Bowel sounds present. No organomegaly or mass.  EXTREMITIES: No pedal edema, cyanosis, or clubbing.  NEUROLOGIC: Cranial nerves II through XII are intact. Muscle strength 5/5 in all extremities. Sensation intact. Gait not checked.  PSYCHIATRIC: The patient is alert and oriented x 3.  SKIN: No obvious rash, lesion, or ulcer.   DATA REVIEW:   CBC   Recent Labs Lab 01/25/17 0540  WBC 12.4*  HGB 12.9*  HCT 38.1*  PLT 181    Chemistries   Recent Labs Lab 01/24/17 0243 01/24/17 1245  NA 138  --   K 3.2*  --   CL 102  --   CO2 26  --   GLUCOSE 106*  --   BUN 22*  --   CREATININE 1.14  --   CALCIUM 8.3*  --   MG  --  2.1  Microbiology Results   Recent Results (from the past 240 hour(s))  Blood Culture (routine x 2)     Status: None (Preliminary result)   Collection Time: 01/23/17 12:58 PM  Result Value Ref Range Status   Specimen Description BLOOD LEFT ANTECUBITAL  Final   Special Requests   Final    BOTTLES DRAWN AEROBIC AND ANAEROBIC Blood Culture adequate volume   Culture NO GROWTH 4 DAYS  Final   Report Status PENDING  Incomplete  Blood Culture (routine x 2)     Status: None (Preliminary result)   Collection Time: 01/23/17 12:58 PM  Result Value Ref Range Status   Specimen Description BLOOD RIGHT ANTECUBITAL  Final   Special Requests   Final    BOTTLES DRAWN AEROBIC AND ANAEROBIC Blood Culture results may not be optimal due to an excessive volume of blood received in culture bottles   Culture NO GROWTH 4 DAYS  Final   Report Status PENDING  Incomplete  Urine culture     Status: None   Collection Time: 01/23/17  4:43 PM  Result Value Ref Range Status   Specimen Description URINE, RANDOM  Final   Special Requests NONE  Final   Culture   Final    NO GROWTH Performed at [facility], [address], [place], [place] [address]    Report Status 01/24/2017 FINAL  Final  Culture, expectorated sputum-assessment     Status: None   Collection Time: 01/24/17 12:13 PM  Result Value Ref Range Status   Specimen Description SPUTUM  Final   Special Requests NONE  Final   Sputum evaluation THIS SPECIMEN IS ACCEPTABLE FOR SPUTUM CULTURE  Final   Report Status 01/24/2017 FINAL  Final  Culture, respiratory (NON-Expectorated)     Status: None   Collection Time: 01/24/17 12:13 PM  Result Value Ref Range Status   Specimen Description SPUTUM  Final   Special Requests NONE Reflexed from [id number]  Final   Gram Stain   Final    RARE WBC PRESENT, PREDOMINANTLY MONONUCLEAR RARE GRAM POSITIVE COCCI IN PAIRS RARE GRAM POSITIVE RODS    Culture   Final    Consistent with normal respiratory flora. Performed at [facility], [address], [place], [place] [address]    Report Status 01/27/2017 FINAL  Final    RADIOLOGY:  No results found.   Management plans discussed with the patient, family and they are in agreement.  CODE STATUS:     Code Status Orders        Start     Ordered   01/23/17 1446  Full code  Continuous     01/23/17 1450    Code Status History    Date Active Date Inactive Code Status Order ID Comments User Context   This patient has a current code status but no historical code status.      TOTAL TIME TAKING CARE OF THIS PATIENT: 40 minutes.    Maddax Palinkas M.D on 01/27/2017 at 4:39 PM  Between 7am to 6pm - Pager - [phone] After 6pm go to [email or web address] - Social research officer, government  [facility] [place] [facility]  Office  [phone]  CC: Primary care physician; Patrice Paradise, MD

## 2017-01-27 NOTE — Care Management (Signed)
No discharge needs identified by members of the care team 

## 2017-01-27 NOTE — Progress Notes (Signed)
Pt's BP finally improved. Pt doing ok. sats >92% on RA on exertion. D/c home

## 2017-01-27 NOTE — Plan of Care (Signed)
Problem: Pain Managment: Goal: General experience of comfort will improve Outcome: Progressing No complaints of pain this shift, will continue to monitor.  Problem: Activity: Goal: Risk for activity intolerance will decrease Outcome: Completed/Met Date Met: 01/27/17 Up ad lib in the room independently, tolerating well.

## 2017-01-27 NOTE — Progress Notes (Signed)
Pt to be discharged this evening. Iv's and tele removed. disch instructions given to pt and wife to their understanding. prescrips electronically submitted. disch via w.c. Accompanied by wife.

## 2017-01-28 LAB — CULTURE, BLOOD (ROUTINE X 2)
Culture: NO GROWTH
Culture: NO GROWTH
SPECIAL REQUESTS: ADEQUATE

## 2017-01-29 ENCOUNTER — Other Ambulatory Visit: Payer: Self-pay | Admitting: *Deleted

## 2017-01-29 DIAGNOSIS — J189 Pneumonia, unspecified organism: Secondary | ICD-10-CM

## 2017-01-30 ENCOUNTER — Telehealth: Payer: Self-pay | Admitting: Internal Medicine

## 2017-01-30 NOTE — Telephone Encounter (Signed)
Pt called to schedule hfu Scheduled 10/5 with [facility] but needs sooner Added to waitlist

## 2017-01-30 NOTE — Telephone Encounter (Signed)
Benjamin Goodman has plenty of openings on 9/12 and 9/13.

## 2017-02-05 DIAGNOSIS — J181 Lobar pneumonia, unspecified organism: Secondary | ICD-10-CM | POA: Diagnosis not present

## 2017-02-05 DIAGNOSIS — R918 Other nonspecific abnormal finding of lung field: Secondary | ICD-10-CM | POA: Diagnosis not present

## 2017-03-14 ENCOUNTER — Inpatient Hospital Stay: Payer: 59 | Admitting: Internal Medicine

## 2019-01-25 ENCOUNTER — Other Ambulatory Visit: Payer: Self-pay

## 2019-01-25 DIAGNOSIS — Z20822 Contact with and (suspected) exposure to covid-19: Secondary | ICD-10-CM

## 2019-01-26 LAB — NOVEL CORONAVIRUS, NAA: SARS-CoV-2, NAA: NOT DETECTED

## 2019-04-15 IMAGING — CR DG CHEST 2V
1 series · 2 of 2 positions shown · non-contrast
Comparison: Chest x-ray of [DATE], [id number] and left shoulder
series of June 12, 2013.

CLINICAL DATA: Chest pain, shortness of breath, hemoptysis, fever,
and elevated blood pressure for the past 4 days.

EXAM:
CHEST  2 VIEW

[Series 1: dg chest 2 view · 0.14mm/px · 2 of 2 slices shown]
[im 1/2]
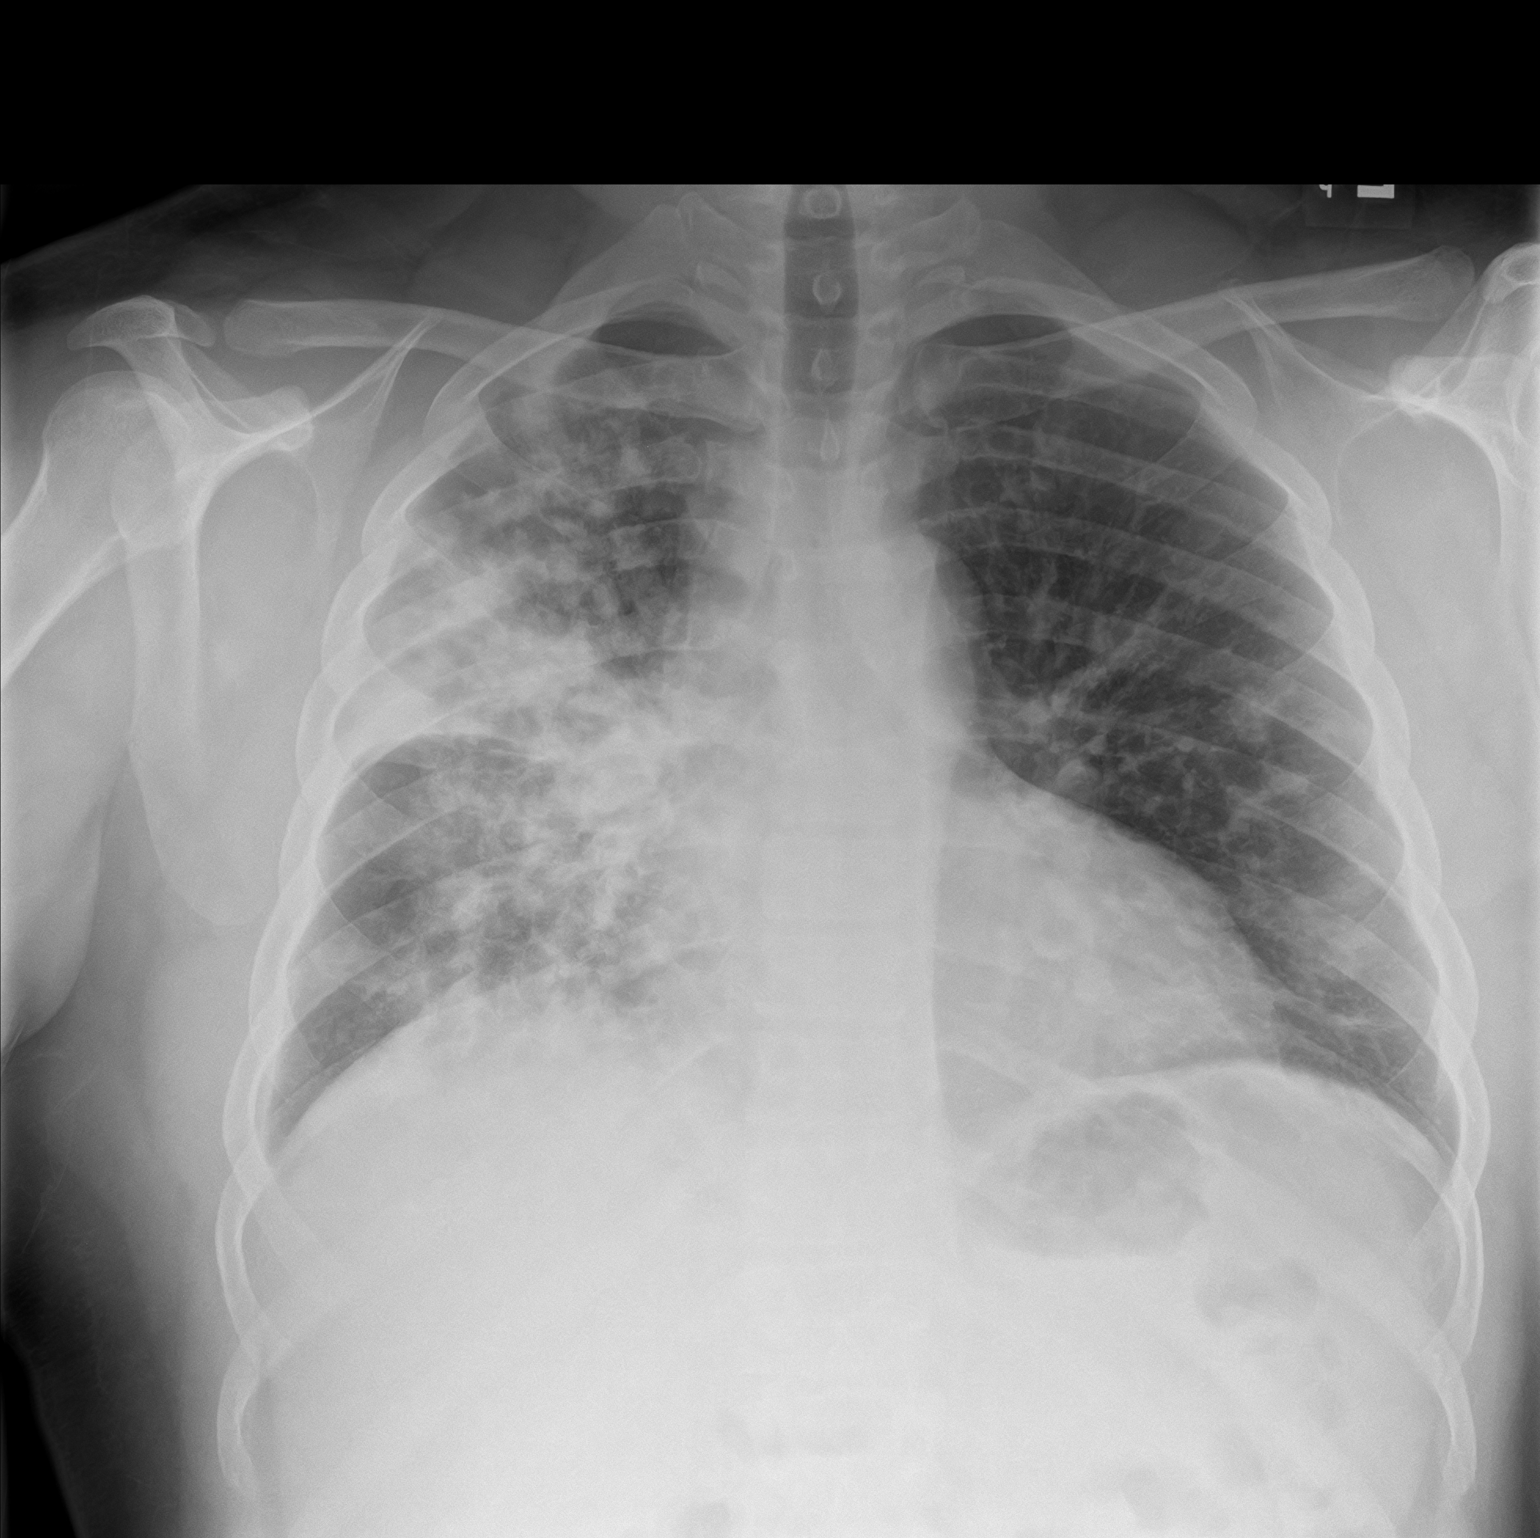
[im 2/2]
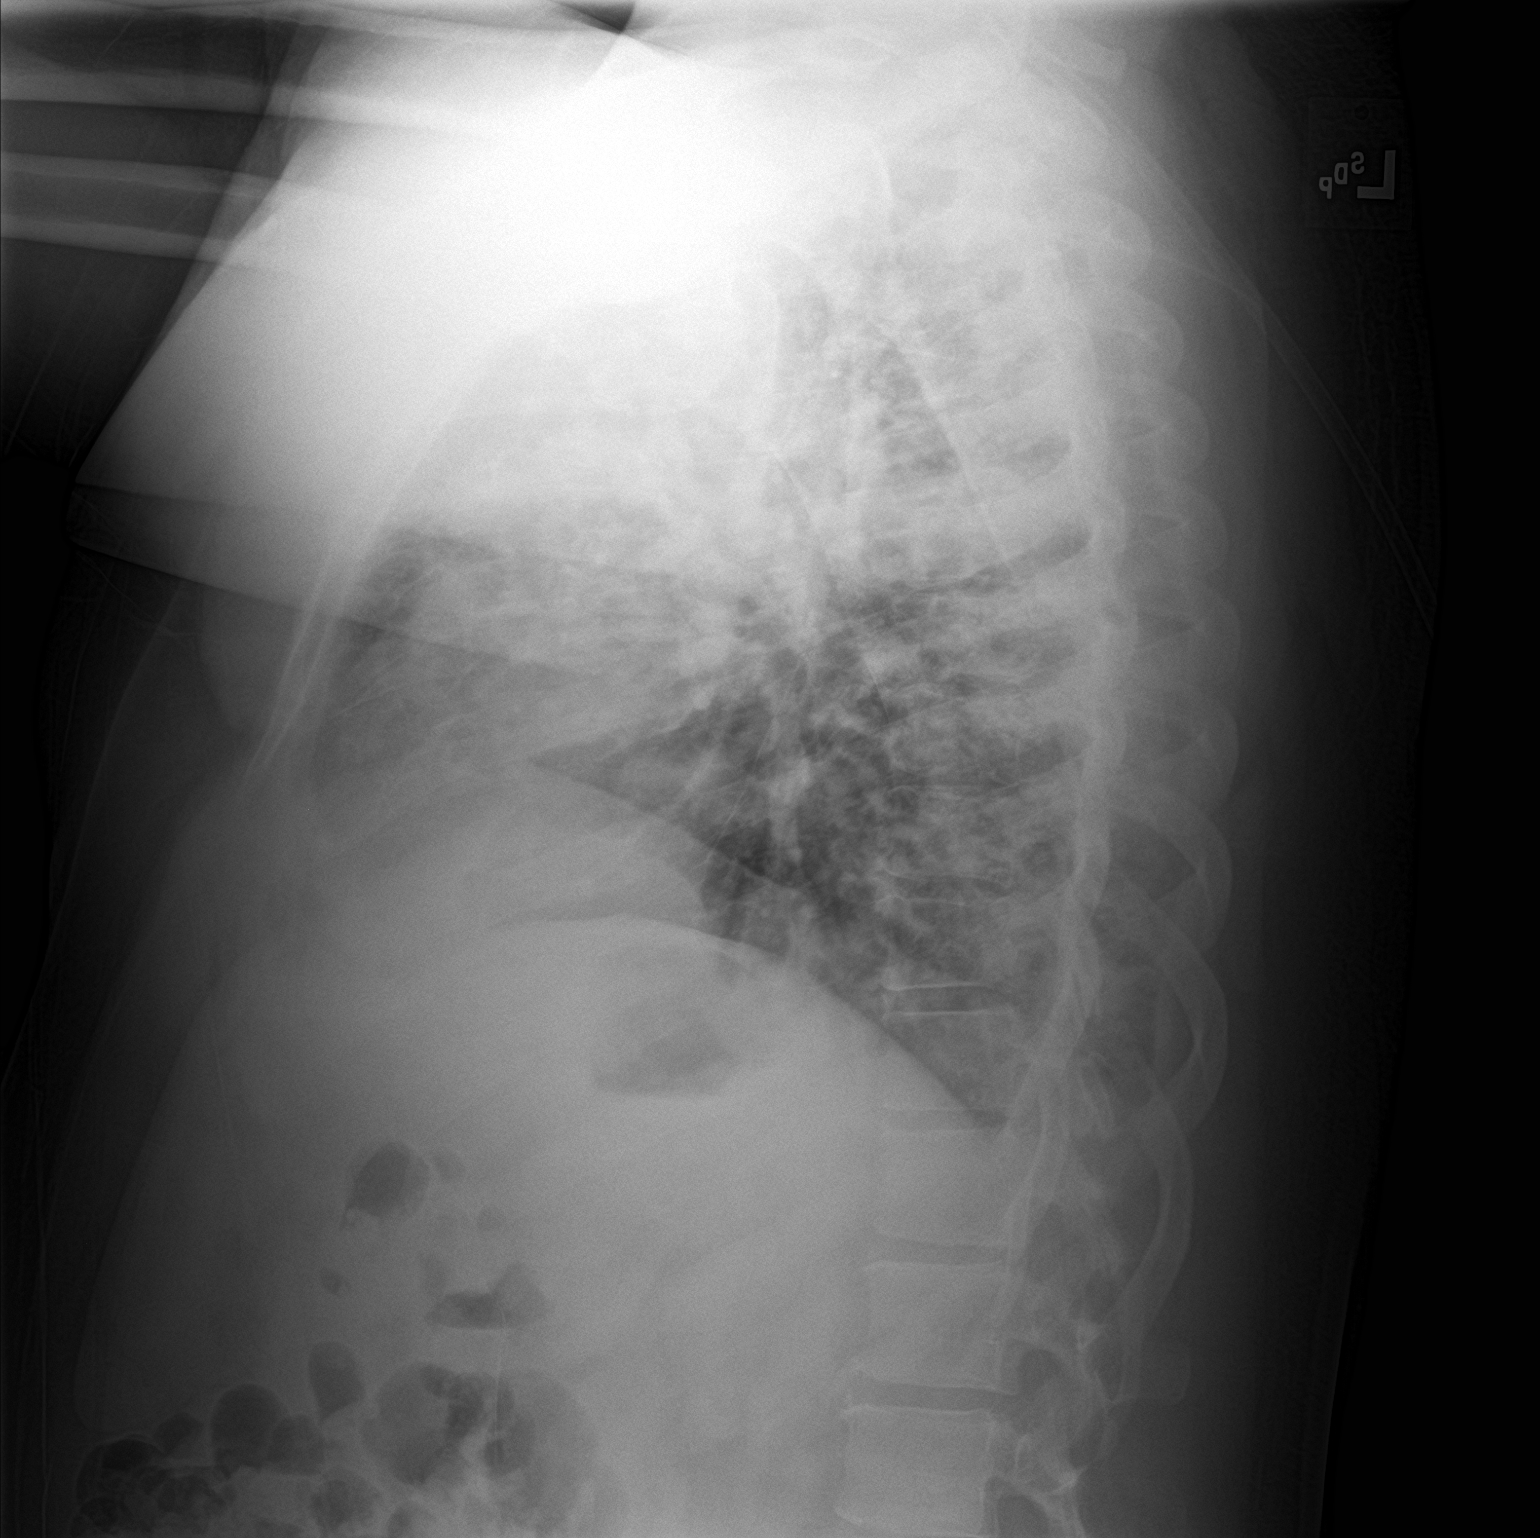

[2 of 2 positions shown; findings below may reference images not displayed]

FINDINGS: The lungs are reasonably well inflated. There are confluent alveolar
opacities in the right upper and right middle lobes and early
alveolar changes in the left lower lobe laterally. There is no
pleural effusion. The cardiac silhouette is enlarged. The pulmonary
vascularity is not engorged. The trachea is midline. The bony thorax
is unremarkable.
IMPRESSION: Findings most compatible with bilateral pneumonia. Followup PA and
lateral chest X-ray is recommended in 3-4 weeks following trial of
antibiotic therapy to ensure resolution and exclude underlying
malignancy. If the patient's clinical condition worsens in the
meantime, chest CT scanning would be recommended.

## 2019-07-08 ENCOUNTER — Ambulatory Visit: Payer: BC Managed Care – PPO | Attending: Internal Medicine

## 2019-07-08 DIAGNOSIS — Z20822 Contact with and (suspected) exposure to covid-19: Secondary | ICD-10-CM

## 2019-07-08 DIAGNOSIS — U071 COVID-19: Secondary | ICD-10-CM | POA: Insufficient documentation

## 2019-07-09 LAB — NOVEL CORONAVIRUS, NAA: SARS-CoV-2, NAA: DETECTED — AB

## 2019-07-09 NOTE — Progress Notes (Signed)
Your test for COVID-19 was positive ("detected"), meaning that you were infected with the novel coronavirus and could give the germ to others.    Please continue isolation at home, for at least 10 days since the start of your fever/cough/breathlessness and until you have had 24 hours without fever (without taking a fever reducer) and with any cough/breathlessness improving. Use over-the-counter medications for symptoms.  If you have had no symptoms, but were exposed to someone who was positive for COVID-19, you will need to quarantine and self-isolate for 14 days from the date of exposure.    Please continue good preventive care measures, including:  frequent hand-washing, avoid touching your face, cover coughs/sneezes, stay out of crowds and keep a 6 foot distance from others.  Clean hard surfaces touched frequently with disinfectant cleaning products.   Please check in with your primary care provider about your positive test result.  Go to the nearest urgent care or ED for assessment if you have severe breathlessness or severe weakness/fatigue (ex needing new help getting out of bed or to the bathroom).  Members of your household will also need to quarantine for 14 days from the date of your positive test. You may be contacted to discuss possible treatment options, and you may also be contacted by the health department for follow up. Please call [place] at [phone] if you have any questions or concerns.     

## 2019-07-12 ENCOUNTER — Telehealth: Payer: Self-pay | Admitting: Pulmonary Disease

## 2019-07-12 NOTE — Telephone Encounter (Signed)
07/12/2019    I attempted to reach the patient regarding recent Covid diagnosis as well as opportunity for monoclonal antibody infusion.  It does look like the patient would qualify based off of BMI 43.7.  I have attempted to leave a message with the telephone number: [phone] for the patient to call us back, unfortunately no voicemail set up.  I will also send another MyChart message detailing this as well.   Will route to PCP as FYI.  Coral Ceo, NP

## 2019-09-13 ENCOUNTER — Inpatient Hospital Stay
Admission: EM | Admit: 2019-09-13 | Discharge: 2019-09-16 | DRG: 280 | Disposition: A | Discharge status: Home / Self Care | Payer: BC Managed Care – PPO | Attending: Family Medicine | Admitting: Family Medicine

## 2019-09-13 ENCOUNTER — Encounter: Payer: Self-pay | Admitting: *Deleted

## 2019-09-13 ENCOUNTER — Emergency Department: Payer: BC Managed Care – PPO

## 2019-09-13 ENCOUNTER — Other Ambulatory Visit: Payer: Self-pay

## 2019-09-13 DIAGNOSIS — G4700 Insomnia, unspecified: Secondary | ICD-10-CM | POA: Diagnosis present

## 2019-09-13 DIAGNOSIS — Z20822 Contact with and (suspected) exposure to covid-19: Secondary | ICD-10-CM | POA: Diagnosis present

## 2019-09-13 DIAGNOSIS — I110 Hypertensive heart disease with heart failure: Secondary | ICD-10-CM | POA: Diagnosis present

## 2019-09-13 DIAGNOSIS — Z713 Dietary counseling and surveillance: Secondary | ICD-10-CM

## 2019-09-13 DIAGNOSIS — R7303 Prediabetes: Secondary | ICD-10-CM | POA: Diagnosis present

## 2019-09-13 DIAGNOSIS — R778 Other specified abnormalities of plasma proteins: Secondary | ICD-10-CM | POA: Diagnosis not present

## 2019-09-13 DIAGNOSIS — Z9114 Patient's other noncompliance with medication regimen: Secondary | ICD-10-CM | POA: Diagnosis not present

## 2019-09-13 DIAGNOSIS — I161 Hypertensive emergency: Secondary | ICD-10-CM

## 2019-09-13 DIAGNOSIS — T502X5A Adverse effect of carbonic-anhydrase inhibitors, benzothiadiazides and other diuretics, initial encounter: Secondary | ICD-10-CM | POA: Diagnosis present

## 2019-09-13 DIAGNOSIS — R7989 Other specified abnormal findings of blood chemistry: Secondary | ICD-10-CM

## 2019-09-13 DIAGNOSIS — I5043 Acute on chronic combined systolic (congestive) and diastolic (congestive) heart failure: Secondary | ICD-10-CM | POA: Diagnosis present

## 2019-09-13 DIAGNOSIS — I10 Essential (primary) hypertension: Secondary | ICD-10-CM | POA: Diagnosis not present

## 2019-09-13 DIAGNOSIS — E876 Hypokalemia: Secondary | ICD-10-CM | POA: Diagnosis present

## 2019-09-13 DIAGNOSIS — E785 Hyperlipidemia, unspecified: Secondary | ICD-10-CM | POA: Diagnosis present

## 2019-09-13 DIAGNOSIS — R079 Chest pain, unspecified: Secondary | ICD-10-CM | POA: Diagnosis not present

## 2019-09-13 DIAGNOSIS — I5031 Acute diastolic (congestive) heart failure: Secondary | ICD-10-CM | POA: Diagnosis not present

## 2019-09-13 DIAGNOSIS — Z79899 Other long term (current) drug therapy: Secondary | ICD-10-CM | POA: Diagnosis not present

## 2019-09-13 DIAGNOSIS — I21A1 Myocardial infarction type 2: Secondary | ICD-10-CM | POA: Diagnosis present

## 2019-09-13 DIAGNOSIS — Z6841 Body Mass Index (BMI) 40.0 and over, adult: Secondary | ICD-10-CM

## 2019-09-13 DIAGNOSIS — I160 Hypertensive urgency: Secondary | ICD-10-CM | POA: Diagnosis present

## 2019-09-13 DIAGNOSIS — Z7982 Long term (current) use of aspirin: Secondary | ICD-10-CM

## 2019-09-13 DIAGNOSIS — N179 Acute kidney failure, unspecified: Secondary | ICD-10-CM | POA: Diagnosis not present

## 2019-09-13 LAB — BASIC METABOLIC PANEL
Anion gap: 8 (ref 5–15)
BUN: 16 mg/dL (ref 6–20)
CO2: 28 mmol/L (ref 22–32)
Calcium: 8.8 mg/dL — ABNORMAL LOW (ref 8.9–10.3)
Chloride: 104 mmol/L (ref 98–111)
Creatinine, Ser: 1.24 mg/dL (ref 0.61–1.24)
GFR calc Af Amer: >60 mL/min (ref >60)
GFR calc non Af Amer: >60 mL/min (ref >60)
Glucose, Bld: 120 mg/dL — ABNORMAL HIGH (ref 70–99)
Potassium: 3.3 mmol/L — ABNORMAL LOW (ref 3.5–5.1)
Sodium: 140 mmol/L (ref 135–145)

## 2019-09-13 LAB — CBC
HCT: 46.1 % (ref 39.0–52.0)
Hemoglobin: 15 g/dL (ref 13.0–17.0)
MCH: 27.1 pg (ref 26.0–34.0)
MCHC: 32.5 g/dL (ref 30.0–36.0)
MCV: 83.4 fL (ref 80.0–100.0)
Platelets: 191 10*3/uL (ref 150–400)
RBC: 5.53 MIL/uL (ref 4.22–5.81)
RDW: 14.6 % (ref 11.5–15.5)
WBC: 8.6 10*3/uL (ref 4.0–10.5)
nRBC: 0 % (ref 0.0–0.2)

## 2019-09-13 LAB — T4, FREE: Free T4: 0.96 ng/dL (ref 0.61–1.12)

## 2019-09-13 LAB — TROPONIN I (HIGH SENSITIVITY)
Troponin I (High Sensitivity): 52 ng/L — ABNORMAL HIGH (ref <18)
Troponin I (High Sensitivity): 45 ng/L — ABNORMAL HIGH (ref <18)
Troponin I (High Sensitivity): 39 ng/L — ABNORMAL HIGH (ref <18)

## 2019-09-13 LAB — GLUCOSE, CAPILLARY: Glucose-Capillary: 92 mg/dL (ref 70–99)

## 2019-09-13 LAB — BRAIN NATRIURETIC PEPTIDE: B Natriuretic Peptide: 210 pg/mL — ABNORMAL HIGH (ref 0.0–100.0)

## 2019-09-13 LAB — HIV ANTIBODY (ROUTINE TESTING W REFLEX): HIV Screen 4th Generation wRfx: NONREACTIVE

## 2019-09-13 LAB — FIBRIN DERIVATIVES D-DIMER (ARMC ONLY): Fibrin derivatives D-dimer (ARMC): 405.01 ng/mL (FEU) (ref 0.00–499.00)

## 2019-09-13 LAB — SARS CORONAVIRUS 2 (TAT 6-24 HRS): SARS Coronavirus 2: NEGATIVE

## 2019-09-13 LAB — TSH: TSH: 2.206 u[IU]/mL (ref 0.350–4.500)

## 2019-09-13 MED ORDER — LABETALOL HCL 5 MG/ML IV SOLN
20.0000 mg | Freq: Once | INTRAVENOUS | Status: AC
  Administered 2019-09-13: 20 mg via INTRAVENOUS
  Filled 2019-09-13: qty 4

## 2019-09-13 MED ORDER — LISINOPRIL 20 MG PO TABS
20.0000 mg | ORAL_TABLET | Freq: Every day | ORAL | Status: DC
  Administered 2019-09-13 – 2019-09-16 (×4): 20 mg via ORAL
  Filled 2019-09-13 (×4): qty 1

## 2019-09-13 MED ORDER — METOPROLOL TARTRATE 50 MG PO TABS
50.0000 mg | ORAL_TABLET | Freq: Two times a day (BID) | ORAL | Status: DC
  Administered 2019-09-13 – 2019-09-14 (×4): 50 mg via ORAL
  Filled 2019-09-13 (×4): qty 1

## 2019-09-13 MED ORDER — SODIUM CHLORIDE 0.9 % IV SOLN: 250.0000 mL | INTRAVENOUS | Status: DC | PRN

## 2019-09-13 MED ORDER — FUROSEMIDE 10 MG/ML IJ SOLN
40.0000 mg | Freq: Two times a day (BID) | INTRAMUSCULAR | Status: DC
  Administered 2019-09-13 – 2019-09-16 (×7): 40 mg via INTRAVENOUS
  Filled 2019-09-13 (×7): qty 4

## 2019-09-13 MED ORDER — ACETAMINOPHEN 325 MG PO TABS
650.0000 mg | ORAL_TABLET | ORAL | Status: DC | PRN
  Administered 2019-09-13 (×2): 650 mg via ORAL
  Filled 2019-09-13 (×2): qty 2

## 2019-09-13 MED ORDER — ONDANSETRON HCL 4 MG/2ML IJ SOLN: 4.0000 mg | Freq: Four times a day (QID) | INTRAMUSCULAR | Status: DC | PRN

## 2019-09-13 MED ORDER — NITROGLYCERIN 0.4 MG SL SUBL
0.4000 mg | SUBLINGUAL_TABLET | SUBLINGUAL | Status: DC | PRN
  Administered 2019-09-13 (×2): 0.4 mg via SUBLINGUAL
  Filled 2019-09-13 (×3): qty 1

## 2019-09-13 MED ORDER — ASPIRIN 81 MG PO CHEW
324.0000 mg | CHEWABLE_TABLET | Freq: Once | ORAL | Status: AC
  Administered 2019-09-13: 324 mg via ORAL
  Filled 2019-09-13: qty 4

## 2019-09-13 MED ORDER — ALPRAZOLAM 0.25 MG PO TABS
0.2500 mg | ORAL_TABLET | Freq: Two times a day (BID) | ORAL | Status: DC | PRN
  Administered 2019-09-13 – 2019-09-15 (×2): 0.25 mg via ORAL
  Filled 2019-09-13 (×2): qty 1

## 2019-09-13 MED ORDER — ENOXAPARIN SODIUM 40 MG/0.4ML ~~LOC~~ SOLN
40.0000 mg | Freq: Two times a day (BID) | SUBCUTANEOUS | Status: DC
  Administered 2019-09-13 – 2019-09-16 (×6): 40 mg via SUBCUTANEOUS
  Filled 2019-09-13 (×7): qty 0.4

## 2019-09-13 MED ORDER — MORPHINE SULFATE (PF) 2 MG/ML IV SOLN
2.0000 mg | INTRAVENOUS | Status: DC | PRN
  Administered 2019-09-13: 2 mg via INTRAVENOUS
  Filled 2019-09-13: qty 1

## 2019-09-13 MED ORDER — SODIUM CHLORIDE 0.9% FLUSH: 3.0000 mL | Freq: Once | INTRAVENOUS | Status: DC

## 2019-09-13 MED ORDER — SODIUM CHLORIDE 0.9% FLUSH
3.0000 mL | INTRAVENOUS | Status: DC | PRN
  Administered 2019-09-14 – 2019-09-15 (×3): 3 mL via INTRAVENOUS

## 2019-09-13 MED ORDER — POTASSIUM CHLORIDE CRYS ER 20 MEQ PO TBCR
40.0000 meq | EXTENDED_RELEASE_TABLET | Freq: Two times a day (BID) | ORAL | Status: AC
  Administered 2019-09-13 (×2): 40 meq via ORAL
  Filled 2019-09-13 (×2): qty 2

## 2019-09-13 MED ORDER — OXYCODONE HCL 5 MG PO TABS
5.0000 mg | ORAL_TABLET | Freq: Four times a day (QID) | ORAL | Status: DC | PRN
  Administered 2019-09-15 – 2019-09-16 (×3): 5 mg via ORAL
  Filled 2019-09-13 (×3): qty 1

## 2019-09-13 MED ORDER — SODIUM CHLORIDE 0.9% FLUSH
3.0000 mL | Freq: Two times a day (BID) | INTRAVENOUS | Status: DC
  Administered 2019-09-13 – 2019-09-16 (×7): 3 mL via INTRAVENOUS

## 2019-09-13 NOTE — ED Provider Notes (Signed)
[facility] Emergency Department Provider Note  ____________________________________________  Time seen: Approximately 4:41 AM  I have reviewed the triage vital signs and the nursing notes.   HISTORY  Chief Complaint Chest Pain    HPI Benjamin Lotz. is a 34 y.o. male with a history of hypertension who complains of chest pain for the past week.  Pain is constant, described as heaviness, nonradiating, associated with shortness of breath.  Worse with exertion, better with rest.  Takes a baby aspirin daily but no other medications currently.  Denies any recent travel trauma hospitalization surgery or history of DVT or PE.      Past Medical History:  Diagnosis Date  . Hypertension      Patient Active Problem List   Diagnosis Date Noted  . Chest pain 09/13/2019  . Hypertensive urgency 09/13/2019  . Obesity, Class III, BMI 40-49.9 (morbid obesity) (HCC) 09/13/2019  . Elevated troponin 09/13/2019  . Acute respiratory failure with hypoxia (HCC) 01/23/2017     Past Surgical History:  Procedure Laterality Date  . TONSILLECTOMY       Prior to Admission medications   Medication Sig Start Date End Date Taking? Authorizing Provider  aspirin 325 MG tablet Take 325 mg by mouth in the morning and at bedtime.   Yes [provider]  Multiple Vitamins-Minerals (MULTIVITAMIN MEN PO) Take 1 tablet by mouth daily.   Yes [provider]  hydrALAZINE (APRESOLINE) 50 MG tablet Take 1 tablet (50 mg total) by mouth every 8 (eight) hours. Patient not taking: Reported on 09/13/2019 01/27/17   Enedina Finner, MD  lisinopril (PRINIVIL,ZESTRIL) 20 MG tablet Take 1 tablet (20 mg total) by mouth daily. Patient not taking: Reported on 09/13/2019 01/28/17   Enedina Finner, MD  metoprolol tartrate (LOPRESSOR) 50 MG tablet Take 1 tablet (50 mg total) by mouth 2 (two) times daily. Patient not taking: Reported on 09/13/2019 01/27/17   Enedina Finner, MD      Allergies Patient has no known allergies.   History reviewed. No pertinent family history.  Social History Social History   Tobacco Use  . Smoking status: Never Smoker  . Smokeless tobacco: Never Used  Substance Use Topics  . Alcohol use: No  . Drug use: No    Review of Systems  Constitutional:   No fever or chills.  ENT:   No sore throat. No rhinorrhea. Cardiovascular:   Positive chest pain as above without syncope. Respiratory: Positive shortness of breath without cough. Gastrointestinal:   Negative for abdominal pain, vomiting and diarrhea.  Musculoskeletal:   Negative for focal pain or swelling All other systems reviewed and are negative except as documented above in ROS and HPI.  ____________________________________________   PHYSICAL EXAM:  VITAL SIGNS: ED Triage Vitals  Enc Vitals Group     BP 09/13/19 0247 (!) 212/146     Pulse Rate 09/13/19 0247 (!) 113     Resp 09/13/19 0247 (!) 24     Temp 09/13/19 0247 97.7 F (36.5 C)     Temp Source 09/13/19 0247 Oral     SpO2 09/13/19 0247 98 %     Weight 09/13/19 0252 271 lb (122.9 kg)     Height 09/13/19 0252 5\' 8"  (1.727 m)     Head Circumference --      Peak Flow --      Pain Score 09/13/19 0251 2     Pain Loc --      Pain Edu? --  Excl. in GC? --     Vital signs reviewed, nursing assessments reviewed.   Constitutional:   Alert and oriented. Non-toxic appearance. Eyes:   Conjunctivae are normal. EOMI. PERRL. ENT      Head:   Normocephalic and atraumatic.      Nose:   Wearing a mask.      Mouth/Throat:   Wearing a mask.      Neck:   No meningismus. Full ROM. Hematological/Lymphatic/Immunilogical:   No cervical lymphadenopathy. Cardiovascular:   Tachycardia heart rate 105. Symmetric bilateral radial and DP pulses.  No murmurs. Cap refill less than 2 seconds. Respiratory:   Normal respiratory effort without tachypnea/retractions. Breath sounds are clear and equal bilaterally. No  wheezes/rales/rhonchi.  Normal E to I ratio Gastrointestinal:   Soft and nontender. Non distended. There is no CVA tenderness.  No rebound, rigidity, or guarding. Musculoskeletal:   Normal range of motion in all extremities. No joint effusions.  No lower extremity tenderness.  No edema. Neurologic:   Normal speech and language.  Motor grossly intact. No acute focal neurologic deficits are appreciated.  Skin:    Skin is warm, dry and intact. No rash noted.  No petechiae, purpura, or bullae.  ____________________________________________    LABS (pertinent positives/negatives) (all labs ordered are listed, but only abnormal results are displayed) Labs Reviewed  BASIC METABOLIC PANEL - Abnormal; Notable for the following components:      Result Value   Potassium 3.3 (*)    Glucose, Bld 120 (*)    Calcium 8.8 (*)    All other components within normal limits  TROPONIN I (HIGH SENSITIVITY) - Abnormal; Notable for the following components:   Troponin I (High Sensitivity) 52 (*)    All other components within normal limits  CBC  FIBRIN DERIVATIVES D-DIMER ([facility] ONLY)  T4, FREE  TSH  BRAIN NATRIURETIC PEPTIDE  TROPONIN I (HIGH SENSITIVITY)   ____________________________________________   EKG  Interpreted by me Sinus tachycardia rate 111.  Right axis, normal intervals.  Poor R wave progression.  Isolated T wave inversion in lead III with nonspecific RSR' complex.  No acute ischemic changes, normal ST segments.  Repeat EKG performed at 4:08 AM, shows sinus rhythm rate of 85, left axis, poor R wave progression, no acute ischemic changes.  ____________________________________________    RADIOLOGY  DG Chest 2 View  Result Date: 09/13/2019 CLINICAL DATA:  Chest pain EXAM: CHEST - 2 VIEW COMPARISON:  January 25, 2017 FINDINGS: The cardiac silhouette is enlarged. There are prominent interstitial lung markings bilaterally. There is no pneumothorax. No convincing pleural effusion. There is  some fluid within the fissures. There are few small subtle airspace opacities bilaterally. IMPRESSION: Cardiomegaly with findings suggestive of developing pulmonary edema. An underlying atypical infectious process is not entirely excluded. Electronically Signed   By: Katherine Mantle M.D.   On: 09/13/2019 03:37    ____________________________________________   PROCEDURES .Critical Care Performed by: Sharman Cheek, MD Authorized by: Sharman Cheek, MD   Critical care provider statement:    Critical care time (minutes):  32   Critical care time was exclusive of:  Separately billable procedures and treating other patients   Critical care was necessary to treat or prevent imminent or life-threatening deterioration of the following conditions:  Cardiac failure   Critical care was time spent personally by me on the following activities:  Development of treatment plan with patient or surrogate, discussions with consultants, evaluation of patient's response to treatment, examination of patient, obtaining history from  patient or surrogate, ordering and performing treatments and interventions, ordering and review of laboratory studies, ordering and review of radiographic studies, pulse oximetry, re-evaluation of patient's condition and review of old charts    ____________________________________________    CLINICAL IMPRESSION / ASSESSMENT AND PLAN / ED COURSE  Medications ordered in the ED: Medications  sodium chloride flush (NS) 0.9 % injection 3 mL (3 mLs Intravenous Not Given 09/13/19 0344)  nitroGLYCERIN (NITROSTAT) SL tablet 0.4 mg (0.4 mg Sublingual Given 09/13/19 0400)  lisinopril (ZESTRIL) tablet 20 mg (has no administration in time range)  metoprolol tartrate (LOPRESSOR) tablet 50 mg (has no administration in time range)  aspirin chewable tablet 324 mg (324 mg Oral Given 09/13/19 0351)  labetalol (NORMODYNE) injection 20 mg (20 mg Intravenous Given 09/13/19 0352)    Pertinent labs  & imaging results that were available during my care of the patient were reviewed by me and considered in my medical decision making (see chart for details).  Benjamin Rudin. was evaluated in Emergency Department on 09/13/2019 for the symptoms described in the history of present illness. He was evaluated in the context of the global COVID-19 pandemic, which necessitated consideration that the patient might be at risk for infection with the SARS-CoV-2 virus that causes COVID-19. Institutional protocols and algorithms that pertain to the evaluation of patients at risk for COVID-19 are in a state of rapid change based on information released by regulatory bodies including the CDC and federal and state organizations. These policies and algorithms were followed during the patient's care in the ED.     Clinical Course as of Sep 13 443  Mon Sep 13, 2019  7654 Patient presents with exertional chest pain described as tightness and heaviness associate with shortness of breath, ongoing for the past week.  EKG is not consistent with STEMI but does show some changes compared to previous.  Differential includes non-STEMI, hypertensive urgency, pericarditis, PE, hyperthyroidism.  Patient is not septic.  Will check labs, give aspirin and nitroglycerin for cardiac protection and IV labetalol 20 mg for blood pressure and heart rate control   [PS]  0348 Chest x-ray suggestive of pulmonary edema, raising suspicion of non-STEMI.  Labs pending.   [PS]    Clinical Course User Index [PS] Carrie Mew, MD     ----------------------------------------- 4:45 AM on 09/13/2019 -----------------------------------------  Troponin resulted at 52.  Patient unable to tolerate nitroglycerin.  Vital signs improved after IV labetalol for blood pressure control.  Case discussed with hospitalist for further evaluation and management.  ____________________________________________   FINAL CLINICAL IMPRESSION(S) / ED  DIAGNOSES    Final diagnoses:  Chest pain with moderate risk for cardiac etiology  Hypertension, unspecified type     ED Discharge Orders    None      Portions of this note were generated with dragon dictation software. Dictation errors may occur despite best attempts at proofreading.   Carrie Mew, MD 09/13/19 [phone]

## 2019-09-13 NOTE — Progress Notes (Signed)
PHARMACIST - PHYSICIAN COMMUNICATION  CONCERNING:  Enoxaparin (Lovenox) for DVT Prophylaxis    RECOMMENDATION: Patient was prescribed enoxaprin 40mg  q24 hours for VTE prophylaxis.   Filed Weights   09/13/19 0252  Weight: 122.9 kg (271 lb)    Body mass index is 41.21 kg/m.  Estimated Creatinine Clearance: 109.1 mL/min (by C-G formula based on SCr of 1.24 mg/dL).   Based on [facility] policy patient is candidate for enoxaparin 40mg  every 12 hour dosing due to BMI being >40.  DESCRIPTION: Pharmacy has adjusted enoxaparin dose per [facility] policy.  Patient is now receiving enoxaparin 40mg  every 12 hours.    , PharmD Clinical Pharmacist  09/13/2019 4:57 AM

## 2019-09-13 NOTE — ED Notes (Signed)
3rd nitro held due to pt reporting burning under tongue and becoming nauseous with dry heaves. Pt reports chest feels hot. New EKG taken and MD Scotty Court made aware. No new orders at this time.

## 2019-09-13 NOTE — ED Triage Notes (Signed)
Pt c/o chest pain x 1 week. Pt states when he woke tonight he was "gasping for air" and his chest was hurting "really bad."

## 2019-09-13 NOTE — Consult Note (Signed)
CARDIOLOGY CONSULT NOTE               Patient ID: Benjamin Goodman. MRN: [id number] DOB/AGE: 1986-04-27 34 y.o.  Admit date: 09/13/2019 Referring Physician Hazeline Junker, MD Primary Physician Laural Benes, DO Primary Cardiologist None Reason for Consultation chest pain, shortness of breath  HPI: 34 year old gentleman referred for evaluation of chest pain or shortness of breath. The patient has a history of obesity and hypertension, who has not been on antihypertensive medications for a year. He reports a 1 week history of progressive exertional dyspnea and orthopnea without noticeable peripheral edema. He also reports intermittent chest pain, occurring both at rest and with exertion, described as a centralized burning sensation and feeling like "gas," pleuritic and positional in nature. The patient awoke last night when shortness of breath described as a choking sensation with associated centralized chest pain that radiated up to his neck. In the ER, he blood pressure was markedly elevated to 212/146, O2 sat 98% on room air. He was started on IV labetolol, IV Lasix and given SL nitroglycerin. ECG revealed sinus tachycardia at a rate of 111 bpm with nonspecific ST-T wave abnormalities. Chest xray revealed cardiomegaly with developing pulmonary edema, with an underlying atypical infectious process not excluded. Admission labs are notable for high-sensitivity troponin 52, followed by 45, BNP 210, normal D-dimer, potassium 3.3, creatinine 1.24, and BUN 16. The patient reports feeling a little better, but still has shortness of breath when moving around in the room. His states he still has mild, very localized, pleuritic and positional chest pain. He denies a history of MI, stroke, heart failure, known diabetes, CKD, or known family history of heart disease.   Review of systems complete and found to be negative unless listed above     Past Medical History:  Diagnosis Date  . Hypertension     Past  Surgical History:  Procedure Laterality Date  . TONSILLECTOMY      Medications Prior to Admission  Medication Sig Dispense Refill Last Dose  . aspirin 325 MG tablet Take 325 mg by mouth in the morning and at bedtime.   09/10/2019 at 0830  . Multiple Vitamins-Minerals (MULTIVITAMIN MEN PO) Take 1 tablet by mouth daily.   09/10/2019 at 0830  . hydrALAZINE (APRESOLINE) 50 MG tablet Take 1 tablet (50 mg total) by mouth every 8 (eight) hours. (Patient not taking: Reported on 09/13/2019) 90 tablet 1 Not Taking at Unknown time  . lisinopril (PRINIVIL,ZESTRIL) 20 MG tablet Take 1 tablet (20 mg total) by mouth daily. (Patient not taking: Reported on 09/13/2019) 30 tablet 2 Not Taking at Unknown time  . metoprolol tartrate (LOPRESSOR) 50 MG tablet Take 1 tablet (50 mg total) by mouth 2 (two) times daily. (Patient not taking: Reported on 09/13/2019) 60 tablet 2 Not Taking at Unknown time   Social History   Socioeconomic History  . Marital status: Single    Spouse name: Not on file  . Number of children: Not on file  . Years of education: Not on file  . Highest education level: Not on file  Occupational History  . Not on file  Tobacco Use  . Smoking status: Never Smoker  . Smokeless tobacco: Never Used  Substance and Sexual Activity  . Alcohol use: No  . Drug use: No  . Sexual activity: Not on file  Other Topics Concern  . Not on file  Social History Narrative  . Not on file   Social Determinants of Health  Financial Resource Strain:   . Difficulty of Paying Living Expenses:   Food Insecurity:   . Worried About Charity fundraiser in the Last Year:   . Arboriculturist in the Last Year:   Transportation Needs:   . Film/video editor (Medical):   Marland Kitchen Lack of Transportation (Non-Medical):   Physical Activity:   . Days of Exercise per Week:   . Minutes of Exercise per Session:   Stress:   . Feeling of Stress :   Social Connections:   . Frequency of Communication with Friends and Family:    . Frequency of Social Gatherings with Friends and Family:   . Attends Religious Services:   . Active Member of Clubs or Organizations:   . Attends Archivist Meetings:   Marland Kitchen Marital Status:   Intimate Partner Violence:   . Fear of Current or Ex-Partner:   . Emotionally Abused:   Marland Kitchen Physically Abused:   . Sexually Abused:     History reviewed. No pertinent family history.    Review of systems complete and found to be negative unless listed above      PHYSICAL EXAM  General: Well developed, well nourished, in no acute distress HEENT:  Normocephalic and atramatic Neck:  Large neck, no discernable JVD Lungs: Clear bilaterally to auscultation, normal effort of breathing on room air. Heart: HRRR . Normal S1 and S2 without gallops or murmurs.  Abdomen: nondistended Msk:  Back normal, normal gait. Normal strength and tone for age. Extremities: No clubbing, cyanosis. With 1+ bilateral lower extremity edema Neuro: Alert and oriented X 3. Psych:  Good affect, responds appropriately  Labs:   Lab Results  Component Value Date   WBC 8.6 09/13/2019   HGB 15.0 09/13/2019   HCT 46.1 09/13/2019   MCV 83.4 09/13/2019   PLT 191 09/13/2019    Recent Labs  Lab 09/13/19 0314  NA 140  K 3.3*  CL 104  CO2 28  BUN 16  CREATININE 1.24  CALCIUM 8.8*  GLUCOSE 120*   Lab Results  Component Value Date   TROPONINI 0.04 (HH) 01/24/2017    Lab Results  Component Value Date   CHOL 162 01/24/2017   Lab Results  Component Value Date   HDL 12 (L) 01/24/2017   Lab Results  Component Value Date   LDLCALC 79 01/24/2017   Lab Results  Component Value Date   TRIG 354 (H) 01/24/2017   Lab Results  Component Value Date   CHOLHDL 13.5 01/24/2017   No results found for: LDLDIRECT    Radiology: DG Chest 2 View  Result Date: 09/13/2019 CLINICAL DATA:  Chest pain EXAM: CHEST - 2 VIEW COMPARISON:  January 25, 2017 FINDINGS: The cardiac silhouette is enlarged. There are prominent  interstitial lung markings bilaterally. There is no pneumothorax. No convincing pleural effusion. There is some fluid within the fissures. There are few small subtle airspace opacities bilaterally. IMPRESSION: Cardiomegaly with findings suggestive of developing pulmonary edema. An underlying atypical infectious process is not entirely excluded. Electronically Signed   By: Constance Holster M.D.   On: 09/13/2019 03:37    EKG: sinus tachycardia, nonspecific ST-T wave abnormalities.  ASSESSMENT AND PLAN:  1. Hypertensive urgency, with history of HTN, off of medications for a year.  2. Borderline elevated troponin, likely secondary to hypertensive urgency, down-trending from 52 to 45, with atypical chest pain with nonspecific ST-T wave abnormalities.  3. Chest pain, atypical, described as pleuritic, positional, and burning, occurring  with and without exertion. 4. Obesity with BMI 41   Recommendations: 1. Agree with current therapy. 2. Continue lisinopril 20 mg, metoprolol tartrate 50 mg BID  3. Continue IV Lasix 40 mg BID for now with careful monitoring of renal status and electrolytes 4. Obtain 2D echocardiogram 5. Check lipid panel and hemoglobin A1c in morning, and final troponin today 6. Defer stress testing at this time as patient's chest pain is pleuritic and positional and troponin is down-trending. Could be considered as outpatient work-up. 7. Recommend weight loss, DASH diet, medication compliance, and possible sleep study as outpatient. 8. Further recommendations pending patient's initial course.  Signed: Leanora Ivanoff PA-C 09/13/2019, 8:17 AM

## 2019-09-13 NOTE — Plan of Care (Signed)
Pt admitted with c/o chest pain this past week and SOB.  From home.  BP elevated in ED; rec'd labetolol and nitro x2.  BP improved.

## 2019-09-13 NOTE — Progress Notes (Signed)
PROGRESS NOTE  Brief Narrative: Benjamin Goodman. is a 34 y.o. male with a history of HTN and incomplete adherence/follow up presenting with 1 week of progressive midline chest pain and sudden onset shortness of breath that awoke him this morning. He presented to the ED, found to be severely hypertensive (212/416) with tachycardia, tachypnea, in no respiratory distress, 98% on room air. Troponin mildly elevated at 52, nonspecific ST-T changes on ECG, d-dimer normal, BNP 210. CXR showed cardiomegaly and early interstitial prominence concerning for pulmonary edema. IV labetalol and SL NTG were given with improvement in pain and lowering BP.   Subjective: Describes pain as constant, waxing/waning, worse with movement of arms, exertion, better with laying completely still, currently moderate slightly to the left of midline, doesn't radiate and is worse with deeper breaths. Denies cough, wheezing, leg swelling. First time he had pain was after picking up a box at work.  Objective: BP (!) 163/119 (BP Location: Right Arm)   Pulse 95   Temp 97.8 F (36.6 C) (Oral)   Resp 18   Ht 5\' 8"  (1.727 m)   Wt 122.9 kg   SpO2 100%   BMI 41.21 kg/m   Gen: Pleasant male in no distress Pulm: Clear and nonlabored  CV: RRR, no murmur, no JVD, no edema GI: Soft, NT, ND, +BS  Neuro: Alert and oriented. No focal deficits. Skin: No rashes, lesions or ulcers  Assessment & Plan: Active Problems:   Chest pain   Obesity, Class III, BMI 40-49.9 (morbid obesity) (HCC)   Elevated troponin   Hypertensive emergency  Uncontrolled HTN with hypertensive urgency:  - Metoprolol, lisinopril, lasix IV BID - Discussed dietary and lifestyle modification  Hypokalemia:  - Supplement especially with ongoing loop diuretic.  Chest pain, type II NSTEMI: Suspected due to demand myocardial ischemia in setting of hypertensive urgency. Chest pain is atypical for angina. Troponin down-trending.  - Cardiology consulted, deferring  ischemic evaluation to outpatient setting - Echocardiogram - Risk stratification labs.  Obesity: Body mass index is 41.21 kg/m.   , MD Pager on amion 09/13/2019, 1:58 PM

## 2019-09-13 NOTE — H&P (Signed)
History and Physical    Benjamin Goodman. ZTI:[id number] DOB: Jun 23, 1985 DOA: 09/13/2019  PCP: Patient, No Pcp Per   Patient coming from: home  I have personally briefly reviewed patient's old medical records in [facility]  Chief Complaint: chest pain x 1 week, shortness of breath on exertion  HPI: Benjamin Goodman. is a 34 y.o. male with medical history significant for hypertension, lost to follow-up and not taking medication, who presents with a 1 week history of chest pain, shortness of breath on exertion and on the night of arrival awoke gasping for air sudden worsening of his chest pain.  Retrosternal chest pain of severe intensity described as heaviness nonradiating, associated with shortness of breath.  No associated nausea vomiting or diaphoresis.  Patient denied cough fever or chills.  Denied lower extremity edema ED Course: Initial BP in the emergency room was 212/146, with heart rate 113 respiration 24 O2 sat 98% on room air.  EKG showed nonspecific ST-T wave changes.  D-dimer normal at 4 5, troponin elevated at 52.  Chest x-ray showed cardiomegaly with findings suggestive of developing pulmonary edema some fluid in the fissures underlying atypical infectious process not entirely excluded.  Patient was treated with IV labetalol in the ER given sublingual nitroglycerin.  BP down to 140/101 by admission and chest pain improved.  Review of Systems: As per HPI otherwise 10 point review of systems negative.    Past Medical History:  Diagnosis Date  . Hypertension     Past Surgical History:  Procedure Laterality Date  . TONSILLECTOMY       reports that he has never smoked. He has never used smokeless tobacco. He reports that he does not drink alcohol or use drugs.  No Known Allergies  History reviewed. No pertinent family history.   Prior to Admission medications   Medication Sig Start Date End Date Taking? Authorizing Provider  aspirin 325 MG tablet Take 325 mg  by mouth in the morning and at bedtime.   Yes [provider]  Multiple Vitamins-Minerals (MULTIVITAMIN MEN PO) Take 1 tablet by mouth daily.   Yes [provider]  hydrALAZINE (APRESOLINE) 50 MG tablet Take 1 tablet (50 mg total) by mouth every 8 (eight) hours. Patient not taking: Reported on 09/13/2019 01/27/17   Enedina Finner, MD  lisinopril (PRINIVIL,ZESTRIL) 20 MG tablet Take 1 tablet (20 mg total) by mouth daily. Patient not taking: Reported on 09/13/2019 01/28/17   Enedina Finner, MD  metoprolol tartrate (LOPRESSOR) 50 MG tablet Take 1 tablet (50 mg total) by mouth 2 (two) times daily. Patient not taking: Reported on 09/13/2019 01/27/17   Enedina Finner, MD    Physical Exam: Vitals:   09/13/19 0343 09/13/19 0400 09/13/19 0415 09/13/19 0430  BP: (!) 199/150 (!) 160/101 (!) 138/92 (!) 140/101  Pulse: (!) 111 96 81 79  Resp: (!) 21 (!) 24 (!) 26 11  Temp:      TempSrc:      SpO2: 97%  96% 96%  Weight:      Height:         Vitals:   09/13/19 0343 09/13/19 0400 09/13/19 0415 09/13/19 0430  BP: (!) 199/150 (!) 160/101 (!) 138/92 (!) 140/101  Pulse: (!) 111 96 81 79  Resp: (!) 21 (!) 24 (!) 26 11  Temp:      TempSrc:      SpO2: 97%  96% 96%  Weight:      Height:  Constitutional: Alert and awake, oriented x3, mild conversational dyspnea Eyes: PERLA, EOMI, irises appear normal, anicteric sclera,  ENMT: external ears and nose appear normal, normal hearing             Lips appears normal, oropharynx mucosa, tongue, posterior pharynx appear normal  Neck: neck appears normal, no masses, normal ROM, no thyromegaly, no JVD  CVS: S1-S2 clear, no murmur rubs or gallops,  , no carotid bruits, pedal pulses palpable,2+ LE edema Respiratory: Bibasilar rales, no rhonchi. Respiratory effort increased. No accessory muscle use.  Abdomen: soft nontender, nondistended, normal bowel sounds, no hepatosplenomegaly, no hernias Musculoskeletal: : no cyanosis, clubbing , no contractures or  atrophy Neuro: Cranial nerves II-XII intact, sensation, reflexes normal, strength Psych: judgement and insight appear normal, stable mood and affect,  Skin: no rashes or lesions or ulcers, no induration or nodules   Labs on Admission: I have personally reviewed following labs and imaging studies  CBC: Recent Labs  Lab 09/13/19 0314  WBC 8.6  HGB 15.0  HCT 46.1  MCV 83.4  PLT 191   Basic Metabolic Panel: Recent Labs  Lab 09/13/19 0314  NA 140  K 3.3*  CL 104  CO2 28  GLUCOSE 120*  BUN 16  CREATININE 1.24  CALCIUM 8.8*   GFR: Estimated Creatinine Clearance: 109.1 mL/min (by C-G formula based on SCr of 1.24 mg/dL). Liver Function Tests: No results for input(s): AST, ALT, ALKPHOS, BILITOT, PROT, ALBUMIN in the last 168 hours. No results for input(s): LIPASE, AMYLASE in the last 168 hours. No results for input(s): AMMONIA in the last 168 hours. Coagulation Profile: No results for input(s): INR, PROTIME in the last 168 hours. Cardiac Enzymes: No results for input(s): CKTOTAL, CKMB, CKMBINDEX, TROPONINI in the last 168 hours. BNP (last 3 results) No results for input(s): PROBNP in the last 8760 hours. HbA1C: No results for input(s): HGBA1C in the last 72 hours. CBG: No results for input(s): GLUCAP in the last 168 hours. Lipid Profile: No results for input(s): CHOL, HDL, LDLCALC, TRIG, CHOLHDL, LDLDIRECT in the last 72 hours. Thyroid Function Tests: Recent Labs    09/13/19 0314  TSH 2.206  FREET4 0.96   Anemia Panel: No results for input(s): VITAMINB12, FOLATE, FERRITIN, TIBC, IRON, RETICCTPCT in the last 72 hours. Urine analysis:    Component Value Date/Time   COLORURINE YELLOW (A) 01/23/2017 1643   APPEARANCEUR CLEAR (A) 01/23/2017 1643   LABSPEC >1.046 (H) 01/23/2017 1643   PHURINE 7.0 01/23/2017 1643   GLUCOSEU NEGATIVE 01/23/2017 1643   HGBUR NEGATIVE 01/23/2017 1643   BILIRUBINUR NEGATIVE 01/23/2017 1643   KETONESUR NEGATIVE 01/23/2017 1643    PROTEINUR 30 (A) 01/23/2017 1643   NITRITE NEGATIVE 01/23/2017 1643   LEUKOCYTESUR NEGATIVE 01/23/2017 1643    Radiological Exams on Admission: DG Chest 2 View  Result Date: 09/13/2019 CLINICAL DATA:  Chest pain EXAM: CHEST - 2 VIEW COMPARISON:  January 25, 2017 FINDINGS: The cardiac silhouette is enlarged. There are prominent interstitial lung markings bilaterally. There is no pneumothorax. No convincing pleural effusion. There is some fluid within the fissures. There are few small subtle airspace opacities bilaterally. IMPRESSION: Cardiomegaly with findings suggestive of developing pulmonary edema. An underlying atypical infectious process is not entirely excluded. Electronically Signed   By: Katherine Mantle M.D.   On: 09/13/2019 03:37    EKG: Independently reviewed.  Assessment/Plan    Chest pain   Elevated troponin   Hypertensive emergency -Patient presenting with 1 week of chest pain and dyspnea that  suddenly worsened on the night of arrival.  Chest x-ray showing early pulmonary edema.  BNP 210 -BP 212/146 in the ER -Elevated troponin 54 either due to ACS or demand ischemia from hypertensive emergency -Continue to cycle troponins to evaluate for ACS and get serial EKGs -Blood pressure control.  -IV Lasix Started on lisinopril and metoprolol -Patient did not tolerate sublingual nitroglycerin because it gave him a burning sensation -Morphine as needed pain -Echocardiogram in the a.m. -Intake and output monitoring    Obesity, Class III, BMI 40-49.9 (morbid obesity) ([place]) -This complicates overall prognosis and care    DVT prophylaxis: Lovenox  Code Status: full code  Family Communication:  none  Disposition Plan: Back to previous home environment Consults called: none  Status:obs    Athena Masse MD [facility]     09/13/2019, 4:53 AM

## 2019-09-14 ENCOUNTER — Inpatient Hospital Stay (HOSPITAL_COMMUNITY)
Admit: 2019-09-14 | Discharge: 2019-09-14 | Disposition: A | Discharge status: Home / Self Care | Payer: BC Managed Care – PPO | Attending: Internal Medicine | Admitting: Internal Medicine

## 2019-09-14 DIAGNOSIS — I10 Essential (primary) hypertension: Secondary | ICD-10-CM

## 2019-09-14 DIAGNOSIS — R778 Other specified abnormalities of plasma proteins: Secondary | ICD-10-CM

## 2019-09-14 DIAGNOSIS — I5031 Acute diastolic (congestive) heart failure: Secondary | ICD-10-CM

## 2019-09-14 DIAGNOSIS — I160 Hypertensive urgency: Principal | ICD-10-CM

## 2019-09-14 LAB — ECHOCARDIOGRAM COMPLETE
Height: 68 in
Weight: 4339.2 oz

## 2019-09-14 LAB — MAGNESIUM: Magnesium: 2.4 mg/dL (ref 1.7–2.4)

## 2019-09-14 LAB — LIPID PANEL
Cholesterol: 192 mg/dL (ref 0–200)
HDL: 29 mg/dL — ABNORMAL LOW (ref >40)
LDL Cholesterol: 122 mg/dL — ABNORMAL HIGH (ref 0–99)
Total CHOL/HDL Ratio: 6.6 RATIO
Triglycerides: 207 mg/dL — ABNORMAL HIGH (ref <150)
VLDL: 41 mg/dL — ABNORMAL HIGH (ref 0–40)

## 2019-09-14 LAB — BASIC METABOLIC PANEL
Anion gap: 8 (ref 5–15)
BUN: 16 mg/dL (ref 6–20)
CO2: 30 mmol/L (ref 22–32)
Calcium: 8.7 mg/dL — ABNORMAL LOW (ref 8.9–10.3)
Chloride: 105 mmol/L (ref 98–111)
Creatinine, Ser: 1.37 mg/dL — ABNORMAL HIGH (ref 0.61–1.24)
GFR calc Af Amer: >60 mL/min (ref >60)
GFR calc non Af Amer: >60 mL/min (ref >60)
Glucose, Bld: 98 mg/dL (ref 70–99)
Potassium: 3.4 mmol/L — ABNORMAL LOW (ref 3.5–5.1)
Sodium: 143 mmol/L (ref 135–145)

## 2019-09-14 LAB — HEMOGLOBIN A1C
Hgb A1c MFr Bld: 5.7 % — ABNORMAL HIGH (ref 4.8–5.6)
Mean Plasma Glucose: 116.89 mg/dL

## 2019-09-14 MED ORDER — HYDRALAZINE HCL 25 MG PO TABS
25.0000 mg | ORAL_TABLET | Freq: Three times a day (TID) | ORAL | Status: DC
  Administered 2019-09-14 – 2019-09-15 (×4): 25 mg via ORAL
  Filled 2019-09-14 (×4): qty 1

## 2019-09-14 MED ORDER — POTASSIUM CHLORIDE CRYS ER 20 MEQ PO TBCR
40.0000 meq | EXTENDED_RELEASE_TABLET | Freq: Once | ORAL | Status: AC
  Administered 2019-09-14: 40 meq via ORAL
  Filled 2019-09-14: qty 2

## 2019-09-14 NOTE — Progress Notes (Signed)
*  PRELIMINARY RESULTS* Echocardiogram 2D Echocardiogram has been performed.  Benjamin Goodman Benjamin Goodman 09/14/2019, 8:49 AM

## 2019-09-14 NOTE — Progress Notes (Signed)
[facility] Cardiology    SUBJECTIVE: The patient reports that he had a rough night, feeling short of breath when lying in bed, and had to sit up in the recliner to breath better. He had mild chest discomfort described as an ache or soreness, like after exercising, that it is worse with movement of his arm. He received IV Lasix this morning, reports increased urine output, and feeling better than last night. He is able to lie comfortably in bed now without shortness of breath, though he did feel dyspneic when walking from the bathroom back to his bed.    Vitals:   09/13/19 2311 09/14/19 0431 09/14/19 0433 09/14/19 0731  BP: (!) 147/117 (!) 148/113 (!) 139/99 (!) 157/115  Pulse: 93 91 95 93  Resp: 20 20  18   Temp: 98.2 F (36.8 C) 98 F (36.7 C)  97.6 F (36.4 C)  TempSrc: Oral Oral  Oral  SpO2: 96% 96%  99%  Weight:   123 kg   Height:         Intake/Output Summary (Last 24 hours) at 09/14/2019 0750 Last data filed at 09/13/2019 2300 Gross per 24 hour  Intake 600 ml  Output 600 ml  Net 0 ml      PHYSICAL EXAM  General: Well developed, well nourished, in no acute distress, lying in bed HEENT:  Normocephalic and atramatic Neck:  Large neck, no obvious JVD Lungs: Clear bilaterally to auscultation, normal effort of breathing on room air Heart: HRRR . Normal S1 and S2 without gallops or murmurs.  Abdomen: nondistended Msk:  Back normal, gait not assessed. Normal strength and tone for age. Extremities: Trace bilateral lower extremity edema.   Neuro: Alert and oriented X 3. Psych:  Good affect, responds appropriately   LABS: Basic Metabolic Panel: Recent Labs    09/13/19 0314 09/14/19 0403  NA 140 143  K 3.3* 3.4*  CL 104 105  CO2 28 30  GLUCOSE 120* 98  BUN 16 16  CREATININE 1.24 1.37*  CALCIUM 8.8* 8.7*  MG  --  2.4   Liver Function Tests: No results for input(s): AST, ALT, ALKPHOS, BILITOT, PROT, ALBUMIN in the last 72 hours. No results for input(s): LIPASE, AMYLASE in  the last 72 hours. CBC: Recent Labs    09/13/19 0314  WBC 8.6  HGB 15.0  HCT 46.1  MCV 83.4  PLT 191   Cardiac Enzymes: No results for input(s): CKTOTAL, CKMB, CKMBINDEX, TROPONINI in the last 72 hours. BNP: Invalid input(s): POCBNP D-Dimer: No results for input(s): DDIMER in the last 72 hours. Hemoglobin A1C: No results for input(s): HGBA1C in the last 72 hours. Fasting Lipid Panel: Recent Labs    09/14/19 0403  CHOL 192  HDL 29*  LDLCALC 122*  TRIG 207*  CHOLHDL 6.6   Thyroid Function Tests: Recent Labs    09/13/19 0314  TSH 2.206   Anemia Panel: No results for input(s): VITAMINB12, FOLATE, FERRITIN, TIBC, IRON, RETICCTPCT in the last 72 hours.  DG Chest 2 View  Result Date: 09/13/2019 CLINICAL DATA:  Chest pain EXAM: CHEST - 2 VIEW COMPARISON:  January 25, 2017 FINDINGS: The cardiac silhouette is enlarged. There are prominent interstitial lung markings bilaterally. There is no pneumothorax. No convincing pleural effusion. There is some fluid within the fissures. There are few small subtle airspace opacities bilaterally. IMPRESSION: Cardiomegaly with findings suggestive of developing pulmonary edema. An underlying atypical infectious process is not entirely excluded. Electronically Signed   By: Jamie Kato.D.  On: 09/13/2019 03:37     Echo pending  TELEMETRY: sinus rhythm, 93 bpm  ASSESSMENT AND PLAN:  Active Problems:   Chest pain   Obesity, Class III, BMI 40-49.9 (morbid obesity) ([facility])   Elevated troponin   Hypertensive emergency   Hypertensive urgency    1. Hypertensive urgency, on metoprolol, lisinopril, and IV Lasix BID, still elevated this morning. 2. Borderline elevated troponin, likely secondary to demand supply ischemia in the setting of hypertensive urgency. ECG with nonspecific ST-T wave abnormalities. Troponin down-trending (52- 45- 39). 3. Acute CHF, echocardiogram pending. BNP elevated to 210. Chest xray with pulmonary edema.  Receiving IV Lasix 40 mg BID. Orthopnea last night. Improving today. 4. Chest pain, atypical, induced by movement and palpation 5. Obesity  Recommendations: 1. Continue IV Lasix 40 mg BID today with careful monitoring of renal status and electrolytes. 2. Add hydralazine 25 mg TID for better blood pressure control 3. Review 2D echocardiogram 4. Continue metoprolol and lisinopril 5. Discussed medication and dietary compliance, as well as weight loss.  6.  Defer stress testing at this time as patient's chest pain is pleuritic and positional and troponin is down-trending. Could be considered as outpatient work-up.   Leanora Ivanoff, PA-C 09/14/2019 7:50 AM

## 2019-09-14 NOTE — Progress Notes (Signed)
PROGRESS NOTE  Benjamin Goodman.  HTX:[id number] DOB: Oct 28, 1985 DOA: 09/13/2019 PCP: Patient, No Pcp Per   Brief Narrative: Benjamin Goodman. is a 34 y.o. male with a history of HTN and incomplete adherence/follow up presenting with 1 week of progressive midline chest pain and sudden onset shortness of breath that awoke him this morning. He presented to the ED, found to be severely hypertensive (212/416) with tachycardia, tachypnea, in no respiratory distress, 98% on room air. Troponin mildly elevated at 52, nonspecific ST-T changes on ECG, d-dimer normal, BNP 210. CXR showed cardiomegaly and early interstitial prominence concerning for pulmonary edema. IV labetalol and SL NTG were given with improvement in pain and lowering BP.   Assessment & Plan: Active Problems:   Chest pain   Obesity, Class III, BMI 40-49.9 (morbid obesity) (HCC)   Elevated troponin   Hypertensive emergency   Hypertensive urgency  Acute CHF: Undetermined type, though suspect diastolic HF due to HTN.  - Follow up echocardiogram - Follow I/O, daily weights (wt stable), incompletely charted I/O though considerable diuresis per pt. Cr slightly up, though with habitus, CrCl still wnl. - Continue IV diuresis   Uncontrolled HTN with hypertensive urgency:  - Metoprolol, lisinopril, lasix IV BID. Agree with adding hydralazine.  - Discussed dietary and lifestyle modification  Hypokalemia:  - Supplement again today  Hyperlipidemia:  - Dietary changes and lifestyle changes recommended, will need PCP follow up  Chest pain, type II NSTEMI: Suspected due to demand myocardial ischemia in setting of hypertensive urgency. Chest pain is atypical for angina. Troponin down-trending.  - Cardiology consulted, deferring ischemic evaluation to outpatient setting - Echocardiogram pending for WMA  Obesity: Body mass index is 41.21 kg/m.   DVT prophylaxis: Lovenox Code Status: Full Family Communication: None at  bedside Disposition Plan: Patient will return home pending cardiology signing off, still requiring IV diuresis.   Consultants:   Cardiology  Procedures:   Echocardiogram 4/6  Antimicrobials:  None   Subjective: Shortness of breath with exertion and orthopnea are stable, chest pain is overall better but paroxysmal and worse overnight limiting sleep to about 1 hours. Not exertional, worse with positional changes (left and right side sleeping positions). Urinating a lot.   Objective: Vitals:   09/14/19 0433 09/14/19 0731 09/14/19 1129 09/14/19 1526  BP: (!) 139/99 (!) 157/115 (!) 142/110 (!) 157/108  Pulse: 95 93 94 100  Resp:  18 18 16   Temp:  97.6 F (36.4 C) 97.9 F (36.6 C) 98.1 F (36.7 C)  TempSrc:  Oral  Oral  SpO2:  99% 95% 97%  Weight: 123 kg     Height:        Intake/Output Summary (Last 24 hours) at 09/14/2019 1730 Last data filed at 09/14/2019 1600 Gross per 24 hour  Intake 480 ml  Output 2600 ml  Net -2120 ml   Filed Weights   09/13/19 0252 09/14/19 0433  Weight: 122.9 kg 123 kg    Gen: 34 y.o. male in no distress  Pulm: Non-labored breathing room air, crackles at bases but good air movement.  CV: Regular rate and rhythm. No murmur, rub, or gallop. UTD JVD, No significant pedal edema. GI: Abdomen soft, non-tender, non-distended, with normoactive bowel sounds. No organomegaly or masses felt. Ext: Warm, no deformities Skin: No rashes, lesions or ulcers Neuro: Alert and oriented. No focal neurological deficits. Psych: Judgement and insight appear normal. Mood & affect appropriate.   Data Reviewed: I have personally reviewed following labs and imaging  studies  CBC: Recent Labs  Lab 09/13/19 0314  WBC 8.6  HGB 15.0  HCT 46.1  MCV 83.4  PLT 562   Basic Metabolic Panel: Recent Labs  Lab 09/13/19 0314 09/14/19 0403  NA 140 143  K 3.3* 3.4*  CL 104 105  CO2 28 30  GLUCOSE 120* 98  BUN 16 16  CREATININE 1.24 1.37*  CALCIUM 8.8* 8.7*  MG  --   2.4   GFR: Estimated Creatinine Clearance: 98.8 mL/min (A) (by C-G formula based on SCr of 1.37 mg/dL (H)). Liver Function Tests: No results for input(s): AST, ALT, ALKPHOS, BILITOT, PROT, ALBUMIN in the last 168 hours. No results for input(s): LIPASE, AMYLASE in the last 168 hours. No results for input(s): AMMONIA in the last 168 hours. Coagulation Profile: No results for input(s): INR, PROTIME in the last 168 hours. Cardiac Enzymes: No results for input(s): CKTOTAL, CKMB, CKMBINDEX, TROPONINI in the last 168 hours. BNP (last 3 results) No results for input(s): PROBNP in the last 8760 hours. HbA1C: Recent Labs    09/14/19 0403  HGBA1C 5.7*   CBG: Recent Labs  Lab 09/13/19 2309  GLUCAP 92   Lipid Profile: Recent Labs    09/14/19 0403  CHOL 192  HDL 29*  LDLCALC 122*  TRIG 207*  CHOLHDL 6.6   Thyroid Function Tests: Recent Labs    09/13/19 0314  TSH 2.206  FREET4 0.96   Anemia Panel: No results for input(s): VITAMINB12, FOLATE, FERRITIN, TIBC, IRON, RETICCTPCT in the last 72 hours. Urine analysis:    Component Value Date/Time   COLORURINE YELLOW (A) 01/23/2017 1643   APPEARANCEUR CLEAR (A) 01/23/2017 1643   LABSPEC >1.046 (H) 01/23/2017 1643   PHURINE 7.0 01/23/2017 1643   GLUCOSEU NEGATIVE 01/23/2017 1643   HGBUR NEGATIVE 01/23/2017 1643   [place] 01/23/2017 1643   KETONESUR NEGATIVE 01/23/2017 1643   PROTEINUR 30 (A) 01/23/2017 1643   NITRITE NEGATIVE 01/23/2017 1643   LEUKOCYTESUR NEGATIVE 01/23/2017 1643   Recent Results (from the past 240 hour(s))  SARS CORONAVIRUS 2 (TAT 6-24 HRS) Nasopharyngeal Nasopharyngeal Swab     Status: None   Collection Time: 09/13/19  5:24 AM   Specimen: Nasopharyngeal Swab  Result Value Ref Range Status   SARS Coronavirus 2 NEGATIVE NEGATIVE Final    Comment: (NOTE) SARS-CoV-2 target nucleic acids are NOT DETECTED. The SARS-CoV-2 RNA is generally detectable in upper and lower respiratory specimens during  the acute phase of infection. Negative results do not preclude SARS-CoV-2 infection, do not rule out co-infections with other pathogens, and should not be used as the sole basis for treatment or other patient management decisions. Negative results must be combined with clinical observations, patient history, and epidemiological information. The expected result is Negative. Fact Sheet for Patients: SugarRoll.be Fact Sheet for Healthcare Providers: https://www.woods-mathews.com/ This test is not yet approved or cleared by the [place] FDA and  has been authorized for detection and/or diagnosis of SARS-CoV-2 by FDA under an Emergency Use Authorization (EUA). This EUA will remain  in effect (meaning this test can be used) for the duration of the COVID-19 declaration under Section 56 4(b)(1) of the Act, 21 U.S.C. section 360bbb-3(b)(1), unless the authorization is terminated or revoked sooner. Performed at [facility], [place] [address], [place], [place] [address]       Radiology Studies: DG Chest 2 View  Result Date: 09/13/2019 CLINICAL DATA:  Chest pain EXAM: CHEST - 2 VIEW COMPARISON:  January 25, 2017 FINDINGS: The  cardiac silhouette is enlarged. There are prominent interstitial lung markings bilaterally. There is no pneumothorax. No convincing pleural effusion. There is some fluid within the fissures. There are few small subtle airspace opacities bilaterally. IMPRESSION: Cardiomegaly with findings suggestive of developing pulmonary edema. An underlying atypical infectious process is not entirely excluded. Electronically Signed   By: Katherine Mantlehristopher  Green M.D.   On: 09/13/2019 03:37   ECHOCARDIOGRAM COMPLETE  Result Date: 09/14/2019    ECHOCARDIOGRAM REPORT   Patient Name:   Benjamin BalesClayton G Campau Jr. Date of Exam: 09/14/2019 Medical Rec #:  [id number]            Height:       68.0 in Accession #:    [id number]           Weight:       271.2 lb Date of  Birth:  04-Sep-1985            BSA:          2.326 m Patient Age:    33 years             BP:           139/99 mmHg Patient Gender: M                    HR:           100 bpm. Exam Location:  [facility] Procedure: 2D Echo, Color Doppler and Cardiac Doppler Indications:     I50.31 CHF-Acute Diastolic  History:         Patient has prior history of Echocardiogram examinations.                  Signs/Symptoms:Headache and Chest Pain; Risk                  Factors:Hypertension.  Sonographer:     Humphrey RollsJoan Heiss RDCS (AE) Referring Phys:  [id number] Andris BaumannHAZEL V DUNCAN Diagnosing Phys: Yvonne Kendallhristopher End MD IMPRESSIONS  1. Left ventricular ejection fraction, by estimation, is 35 to 40%. The left ventricle has moderately decreased function. The left ventricle demonstrates global hypokinesis. There is moderate left ventricular hypertrophy. Left ventricular diastolic parameters are consistent with Grade III diastolic dysfunction (restrictive).  2. Right ventricular systolic function is mildly reduced. The right ventricular size is normal. Tricuspid regurgitation signal is inadequate for assessing PA pressure.  3. Left atrial size was mildly dilated.  4. The mitral valve is normal in structure. Trivial mitral valve regurgitation. No evidence of mitral stenosis.  5. The aortic valve was not well visualized. Aortic valve regurgitation is not visualized. No aortic stenosis is present.  6. The inferior vena cava is dilated in size with <50% respiratory variability, suggesting right atrial pressure of 15 mmHg. FINDINGS  Left Ventricle: Left ventricular ejection fraction, by estimation, is 35 to 40%. The left ventricle has moderately decreased function. The left ventricle demonstrates global hypokinesis. The left ventricular internal cavity size was normal in size. There is moderate left ventricular hypertrophy. Left ventricular diastolic parameters are consistent with Grade III diastolic dysfunction (restrictive). Right Ventricle: The right ventricular  size is normal. No increase in right ventricular wall thickness. Right ventricular systolic function is mildly reduced. Tricuspid regurgitation signal is inadequate for assessing PA pressure. Left Atrium: Left atrial size was mildly dilated. Right Atrium: Right atrial size was normal in size. Pericardium: There is no evidence of pericardial effusion. Mitral Valve: The mitral valve is normal in structure. Trivial mitral valve regurgitation. No evidence  of mitral valve stenosis. MV peak gradient, 5.5 mmHg. The mean mitral valve gradient is 3.0 mmHg. Tricuspid Valve: The tricuspid valve is grossly normal. Tricuspid valve regurgitation is not demonstrated. Aortic Valve: The aortic valve was not well visualized. Aortic valve regurgitation is not visualized. No aortic stenosis is present. Aortic valve mean gradient measures 2.0 mmHg. Aortic valve peak gradient measures 3.5 mmHg. Aortic valve area, by VTI measures 2.14 cm. Pulmonic Valve: The pulmonic valve was grossly normal. Pulmonic valve regurgitation is not visualized. No evidence of pulmonic stenosis. Aorta: The aortic root is normal in size and structure. Pulmonary Artery: The pulmonary artery is of normal size. Venous: The inferior vena cava is dilated in size with less than 50% respiratory variability, suggesting right atrial pressure of 15 mmHg. IAS/Shunts: The interatrial septum was not well visualized.  LEFT VENTRICLE PLAX 2D LVIDd:         5.26 cm      Diastology LVIDs:         4.03 cm      LV e' lateral:   6.53 cm/s LV PW:         1.52 cm      LV E/e' lateral: 14.1 LV IVS:        1.35 cm      LV e' medial:    6.53 cm/s LVOT diam:     2.10 cm      LV E/e' medial:  14.1 LV SV:         28 LV SV Index:   12 LVOT Area:     3.46 cm  LV Volumes (MOD) LV vol d, MOD A2C: 112.0 ml LV vol d, MOD A4C: 141.0 ml LV vol s, MOD A2C: 69.4 ml LV vol s, MOD A4C: 87.9 ml LV SV MOD A2C:     42.6 ml LV SV MOD A4C:     141.0 ml LV SV MOD BP:      45.3 ml RIGHT VENTRICLE RV Basal  diam:  3.98 cm LEFT ATRIUM             Index       RIGHT ATRIUM           Index LA diam:        4.70 cm 2.02 cm/m  RA Area:     16.80 cm LA Vol (A2C):   54.9 ml 23.60 ml/m RA Volume:   40.30 ml  17.32 ml/m LA Vol (A4C):   77.5 ml 33.32 ml/m LA Biplane Vol: 68.0 ml 29.23 ml/m  AORTIC VALVE                   PULMONIC VALVE AV Area (Vmax):    2.08 cm    PV Vmax:       0.66 m/s AV Area (Vmean):   2.01 cm    PV Vmean:      47.200 cm/s AV Area (VTI):     2.14 cm    PV VTI:        0.120 m AV Vmax:           93.70 cm/s  PV Peak grad:  1.8 mmHg AV Vmean:          60.400 cm/s PV Mean grad:  1.0 mmHg AV VTI:            0.129 m AV Peak Grad:      3.5 mmHg AV Mean Grad:      2.0 mmHg LVOT Vmax:  56.20 cm/s LVOT Vmean:        35.100 cm/s LVOT VTI:          0.080 m LVOT/AV VTI ratio: 0.62  AORTA Ao Root diam: 3.30 cm MITRAL VALVE MV Area (PHT): 6.73 cm    SHUNTS MV Peak grad:  5.5 mmHg    Systemic VTI:  0.08 m MV Mean grad:  3.0 mmHg    Systemic Diam: 2.10 cm MV Vmax:       1.17 m/s MV Vmean:      74.6 cm/s MV Decel Time: 113 msec MV E velocity: 91.83 cm/s MV A velocity: 0.97 cm/s MV E/A ratio:  94.67 Christopher End MD Electronically signed by Yvonne Kendall MD Signature Date/Time: 09/14/2019/2:38:09 PM    Final     Scheduled Meds: . enoxaparin (LOVENOX) injection  40 mg Subcutaneous Q12H  . furosemide  40 mg Intravenous Q12H  . hydrALAZINE  25 mg Oral TID  . lisinopril  20 mg Oral Daily  . metoprolol tartrate  50 mg Oral BID  . sodium chloride flush  3 mL Intravenous Once  . sodium chloride flush  3 mL Intravenous Q12H   Continuous Infusions: . sodium chloride       LOS: 1 day   Time spent: 25 minutes.  Tyrone Nine, MD [facility] [email or web address] 09/14/2019, 5:30 PM

## 2019-09-14 NOTE — Plan of Care (Signed)
Problem: Pain Managment: Goal: General experience of comfort will improve Outcome: Progressing No voiced complaints of pain this shift.   

## 2019-09-15 LAB — BRAIN NATRIURETIC PEPTIDE: B Natriuretic Peptide: 316 pg/mL — ABNORMAL HIGH (ref 0.0–100.0)

## 2019-09-15 LAB — BASIC METABOLIC PANEL
Anion gap: 8 (ref 5–15)
BUN: 21 mg/dL — ABNORMAL HIGH (ref 6–20)
CO2: 27 mmol/L (ref 22–32)
Calcium: 8.6 mg/dL — ABNORMAL LOW (ref 8.9–10.3)
Chloride: 104 mmol/L (ref 98–111)
Creatinine, Ser: 1.49 mg/dL — ABNORMAL HIGH (ref 0.61–1.24)
GFR calc Af Amer: >60 mL/min (ref >60)
GFR calc non Af Amer: >60 mL/min (ref >60)
Glucose, Bld: 101 mg/dL — ABNORMAL HIGH (ref 70–99)
Potassium: 3.1 mmol/L — ABNORMAL LOW (ref 3.5–5.1)
Sodium: 139 mmol/L (ref 135–145)

## 2019-09-15 MED ORDER — LABETALOL HCL 5 MG/ML IV SOLN
10.0000 mg | INTRAVENOUS | Status: DC | PRN
  Administered 2019-09-15: 10 mg via INTRAVENOUS
  Filled 2019-09-15: qty 4

## 2019-09-15 MED ORDER — TRAZODONE HCL 50 MG PO TABS
50.0000 mg | ORAL_TABLET | Freq: Every day | ORAL | Status: DC
  Administered 2019-09-15: 50 mg via ORAL
  Filled 2019-09-15: qty 1

## 2019-09-15 MED ORDER — POTASSIUM CHLORIDE CRYS ER 20 MEQ PO TBCR
40.0000 meq | EXTENDED_RELEASE_TABLET | Freq: Two times a day (BID) | ORAL | Status: AC
  Administered 2019-09-15 (×2): 40 meq via ORAL
  Filled 2019-09-15 (×2): qty 2

## 2019-09-15 MED ORDER — METOPROLOL SUCCINATE ER 50 MG PO TB24
50.0000 mg | ORAL_TABLET | Freq: Every day | ORAL | Status: DC
  Administered 2019-09-15 – 2019-09-16 (×2): 50 mg via ORAL
  Filled 2019-09-15 (×2): qty 1

## 2019-09-15 MED ORDER — HYDRALAZINE HCL 50 MG PO TABS
50.0000 mg | ORAL_TABLET | Freq: Three times a day (TID) | ORAL | Status: DC
  Administered 2019-09-15 – 2019-09-16 (×3): 50 mg via ORAL
  Filled 2019-09-15 (×3): qty 1

## 2019-09-15 MED ORDER — LABETALOL HCL 5 MG/ML IV SOLN: 10.0000 mg | INTRAVENOUS | Status: DC | PRN

## 2019-09-15 NOTE — Progress Notes (Signed)
All medications administered by Maelin Manolo Student Nurse were supervised by this RN Dariann Huckaba A Duwayne Matters  

## 2019-09-15 NOTE — Progress Notes (Signed)
[facility]    SUBJECTIVE: The patient reports feeling about the same as yesterday with no noticeable improvements. His breathing and swelling have improved, but he is still not quite back to baseline. He reports increased urine output. He still complaints of left sided positional chest pain, worse when lying flat with associated shortness of breath, requiring that he sleep propped up.    Vitals:   09/15/19 0354 09/15/19 0356 09/15/19 0358 09/15/19 0754  BP: (!) 158/111 (!) 150/115 (!) 150/109 (!) 146/110  Pulse: 93 94 93 97  Resp: 20   18  Temp: 98.2 F (36.8 C)   (!) 97.5 F (36.4 C)  TempSrc: Oral   Oral  SpO2: 98% 99% 100% 98%  Weight: 122.4 kg     Height:         Intake/Output Summary (Last 24 hours) at 09/15/2019 0816 Last data filed at 09/15/2019 7846 Gross per 24 hour  Intake 480 ml  Output 3400 ml  Net -2920 ml      PHYSICAL EXAM  General: Well developed, well nourished, in no acute distress, lying propped up in bed. Appears comfortable. HEENT:  Normocephalic and atramatic Neck:  No obvious JVD, thick neck.  Lungs: normal effort of breathing on room air. Trace bibasilar crackles Heart: HRRR . Normal S1 and S2 without gallops or murmurs.  Abdomen: nondistended Msk:  Back normal, gait not assessed. No obvious abnormalities Extremities: No clubbing, cyanosis or edema.   Neuro: Alert and oriented X 3. Psych:  Good affect, responds appropriately   LABS: Basic Metabolic Panel: Recent Labs    09/14/19 0403 09/15/19 0503  NA 143 139  K 3.4* 3.1*  CL 105 104  CO2 30 27  GLUCOSE 98 101*  BUN 16 21*  CREATININE 1.37* 1.49*  CALCIUM 8.7* 8.6*  MG 2.4  --    Liver Function Tests: No results for input(s): AST, ALT, ALKPHOS, BILITOT, PROT, ALBUMIN in the last 72 hours. No results for input(s): LIPASE, AMYLASE in the last 72 hours. CBC: Recent Labs    09/13/19 0314  WBC 8.6  HGB 15.0  HCT 46.1  MCV 83.4  PLT 191   Cardiac Enzymes: No results for  input(s): CKTOTAL, CKMB, CKMBINDEX, TROPONINI in the last 72 hours. BNP: Invalid input(s): POCBNP D-Dimer: No results for input(s): DDIMER in the last 72 hours. Hemoglobin A1C: Recent Labs    09/14/19 0403  HGBA1C 5.7*   Fasting Lipid Panel: Recent Labs    09/14/19 0403  CHOL 192  HDL 29*  LDLCALC 122*  TRIG 207*  CHOLHDL 6.6   Thyroid Function Tests: Recent Labs    09/13/19 0314  TSH 2.206   Anemia Panel: No results for input(s): VITAMINB12, FOLATE, FERRITIN, TIBC, IRON, RETICCTPCT in the last 72 hours.  ECHOCARDIOGRAM COMPLETE  Result Date: 09/14/2019    ECHOCARDIOGRAM REPORT   Patient Name:   Benjamin Goodman. Date of Exam: 09/14/2019 Medical Rec #:  [id number]            Height:       68.0 in Accession #:    [id number]           Weight:       271.2 lb Date of Birth:  [birth year]            BSA:          2.326 m Patient Age:    33 years             BP:  139/99 mmHg Patient Gender: M                    HR:           100 bpm. Exam Location:  [facility] Procedure: 2D Echo, Color Doppler and Cardiac Doppler Indications:     I50.31 CHF-Acute Diastolic  History:         Patient has prior history of Echocardiogram examinations.                  Signs/Symptoms:Headache and Chest Pain; Risk                  Factors:Hypertension.  Sonographer:     Humphrey Rolls RDCS (AE) Referring Phys:  [id number] Andris Baumann Diagnosing Phys: Yvonne Kendall MD IMPRESSIONS  1. Left ventricular ejection fraction, by estimation, is 35 to 40%. The left ventricle has moderately decreased function. The left ventricle demonstrates global hypokinesis. There is moderate left ventricular hypertrophy. Left ventricular diastolic parameters are consistent with Grade III diastolic dysfunction (restrictive).  2. Right ventricular systolic function is mildly reduced. The right ventricular size is normal. Tricuspid regurgitation signal is inadequate for assessing PA pressure.  3. Left atrial size was mildly dilated.  4. The  mitral valve is normal in structure. Trivial mitral valve regurgitation. No evidence of mitral stenosis.  5. The aortic valve was not well visualized. Aortic valve regurgitation is not visualized. No aortic stenosis is present.  6. The inferior vena cava is dilated in size with <50% respiratory variability, suggesting right atrial pressure of 15 mmHg. FINDINGS  Left Ventricle: Left ventricular ejection fraction, by estimation, is 35 to 40%. The left ventricle has moderately decreased function. The left ventricle demonstrates global hypokinesis. The left ventricular internal cavity size was normal in size. There is moderate left ventricular hypertrophy. Left ventricular diastolic parameters are consistent with Grade III diastolic dysfunction (restrictive). Right Ventricle: The right ventricular size is normal. No increase in right ventricular wall thickness. Right ventricular systolic function is mildly reduced. Tricuspid regurgitation signal is inadequate for assessing PA pressure. Left Atrium: Left atrial size was mildly dilated. Right Atrium: Right atrial size was normal in size. Pericardium: There is no evidence of pericardial effusion. Mitral Valve: The mitral valve is normal in structure. Trivial mitral valve regurgitation. No evidence of mitral valve stenosis. MV peak gradient, 5.5 mmHg. The mean mitral valve gradient is 3.0 mmHg. Tricuspid Valve: The tricuspid valve is grossly normal. Tricuspid valve regurgitation is not demonstrated. Aortic Valve: The aortic valve was not well visualized. Aortic valve regurgitation is not visualized. No aortic stenosis is present. Aortic valve mean gradient measures 2.0 mmHg. Aortic valve peak gradient measures 3.5 mmHg. Aortic valve area, by VTI measures 2.14 cm. Pulmonic Valve: The pulmonic valve was grossly normal. Pulmonic valve regurgitation is not visualized. No evidence of pulmonic stenosis. Aorta: The aortic root is normal in size and structure. Pulmonary Artery:  The pulmonary artery is of normal size. Venous: The inferior vena cava is dilated in size with less than 50% respiratory variability, suggesting right atrial pressure of 15 mmHg. IAS/Shunts: The interatrial septum was not well visualized.  LEFT VENTRICLE PLAX 2D LVIDd:         5.26 cm      Diastology LVIDs:         4.03 cm      LV e' lateral:   6.53 cm/s LV PW:         1.52 cm  LV E/e' lateral: 14.1 LV IVS:        1.35 cm      LV e' medial:    6.53 cm/s LVOT diam:     2.10 cm      LV E/e' medial:  14.1 LV SV:         28 LV SV Index:   12 LVOT Area:     3.46 cm  LV Volumes (MOD) LV vol d, MOD A2C: 112.0 ml LV vol d, MOD A4C: 141.0 ml LV vol s, MOD A2C: 69.4 ml LV vol s, MOD A4C: 87.9 ml LV SV MOD A2C:     42.6 ml LV SV MOD A4C:     141.0 ml LV SV MOD BP:      45.3 ml RIGHT VENTRICLE RV Basal diam:  3.98 cm LEFT ATRIUM             Index       RIGHT ATRIUM           Index LA diam:        4.70 cm 2.02 cm/m  RA Area:     16.80 cm LA Vol (A2C):   54.9 ml 23.60 ml/m RA Volume:   40.30 ml  17.32 ml/m LA Vol (A4C):   77.5 ml 33.32 ml/m LA Biplane Vol: 68.0 ml 29.23 ml/m  AORTIC VALVE                   PULMONIC VALVE AV Area (Vmax):    2.08 cm    PV Vmax:       0.66 m/s AV Area (Vmean):   2.01 cm    PV Vmean:      47.200 cm/s AV Area (VTI):     2.14 cm    PV VTI:        0.120 m AV Vmax:           93.70 cm/s  PV Peak grad:  1.8 mmHg AV Vmean:          60.400 cm/s PV Mean grad:  1.0 mmHg AV VTI:            0.129 m AV Peak Grad:      3.5 mmHg AV Mean Grad:      2.0 mmHg LVOT Vmax:         56.20 cm/s LVOT Vmean:        35.100 cm/s LVOT VTI:          0.080 m LVOT/AV VTI ratio: 0.62  AORTA Ao Root diam: 3.30 cm MITRAL VALVE MV Area (PHT): 6.73 cm    SHUNTS MV Peak grad:  5.5 mmHg    Systemic VTI:  0.08 m MV Mean grad:  3.0 mmHg    Systemic Diam: 2.10 cm MV Vmax:       1.17 m/s MV Vmean:      74.6 cm/s MV Decel Time: 113 msec MV E velocity: 91.83 cm/s MV A velocity: 0.97 cm/s MV E/A ratio:  94.67 Christopher End MD  Electronically signed by Nelva Bush MD Signature Date/Time: 09/14/2019/2:38:09 PM    Final      Echo: LVEF 35-40% with global hypokinesis, moderate LVH, grade III diastolic dysfunction  TELEMETRY: sinus rhythm, 89 bpm  ASSESSMENT AND PLAN:  Active Problems:   Chest pain   Obesity, Class III, BMI 40-49.9 (morbid obesity) (HCC)   Elevated troponin   Hypertensive emergency   Hypertensive urgency    1. Hypertensive urgency, on metoprolol tartrate,  lisinopril, IV Lasix, and recently added hydralazine TID with persistently elevated blood pressure. 2. Borderline elevated troponin, likely secondary to demand supply ischemia in the setting of hypertensive urgency. ECG with nonspecific ST-T wave abnormalities. Troponin down-trending (52- 45- 39). 3. Acute HFrEF, echocardiogram reveals LVEF 35-40% with global hypokinesis, moderate LVH, and grade III diastolic dysfunction. Likely due to uncontrolled longstanding hypertension. Previous echocardiogram in 2018 without EF listed, though noted as normal in cardiologist progress note. Increased urine output and modest improvement of symptoms on IV Lasix 40 mg BID. BNP 210--> 316. 4. Chest pain, atypical and positional 5. Obesity 6. Hypokalemia, K 3.1 this morning 7. AKI secondary to diuretics, creatinine 1.49 today  Recommendations: 1. Due to reduced EF, will switch from metoprolol tartrate to metoprolol succinate 50 mg and uptitrate as needed for better blood pressure control. 2. Increase hydralazine to 50 mg TID 3. Continue to supplement potassium 4. Continue IV Lasix 40 mg BID for today 5. Will plan to order [facility] as outpatient to assess reduced EF 6. Encourage weight loss and medication and dietary compliance.  Discussed with Dr. Darrold Junker, and plan made in collaboration with him.   Leanora Ivanoff, PA-C 09/15/2019 8:16 AM

## 2019-09-15 NOTE — Plan of Care (Signed)
Nutrition Education Note  RD consulted for nutrition education regarding new onset CHF.  34 y.o. male with a history of HTN who is admitted with CHF and NSTEMI  RD provided "Low Sodium Nutrition Therapy" handout from the Academy of [employer]. Reviewed patient's dietary recall. Provided examples on ways to decrease sodium intake in diet. Discouraged intake of processed foods and use of salt shaker. Encouraged fresh fruits and vegetables as well as whole grain sources of carbohydrates to maximize fiber intake.   RD discussed why it is important for patient to adhere to diet recommendations, and emphasized the role of fluids, foods to avoid, and importance of weighing self daily. Teach back method used.  Expect fair compliance.  Body mass index is 41.04 kg/m. Pt meets criteria for morbid obesity based on current BMI.  Current diet order is HH, patient is consuming approximately 100% of meals at this time. Labs and medications reviewed. No further nutrition interventions warranted at this time. RD contact information provided. If additional nutrition issues arise, please re-consult RD.   Betsey Holiday MS, RD, LDN Please refer to [facility] for RD and/or RD on-call/weekend/after hours pager

## 2019-09-15 NOTE — Clinical Social Work Note (Signed)
1.  Do you have a scale? Yes 2.  Do you weigh yourself daily? Yes 3.  Two pounds over night or 5 in one week call your doctor because it could be fluid overload? Yes 4.  Do you go to the Heart Failure Clinic? Yes 5. An appointment with heart failure clinic will be made before discharge if. 6. If patient is home bound, home health can be arranged with an agency that provides a heart failure protocol.  [place], [place] [phone]

## 2019-09-15 NOTE — Progress Notes (Signed)
PROGRESS NOTE  Benjamin Goodman.  WPY:[id number] DOB: [birth year] DOA: 09/13/2019 PCP: Patient, No Pcp Per   Brief Narrative: Benjamin Goodman. is a 34 y.o. male with a history of HTN and incomplete adherence/follow up presenting with 1 week of progressive midline chest pain and sudden onset shortness of breath that awoke him this morning. He presented to the ED, found to be severely hypertensive (212/416) with tachycardia, tachypnea, in no respiratory distress, 98% on room air. Troponin mildly elevated at 52, nonspecific ST-T changes on ECG, d-dimer normal, BNP 210. CXR showed cardiomegaly and early interstitial prominence concerning for pulmonary edema. IV labetalol and SL NTG were given with improvement in pain and lowering BP.   Assessment & Plan: Active Problems:   Chest pain   Obesity, Class III, BMI 40-49.9 (morbid obesity) (HCC)   Elevated troponin   Hypertensive emergency   Hypertensive urgency  Acute combined HFrEF: Echo shows LVEF 35-40%, G3DD, dilated IVC and mildly reduced RV systolic function.  - Continue IV lasix, I/O, daily weights. BNP up from admission.  - Appreciate cardiology recommendations  Uncontrolled HTN with hypertensive urgency:  - Metoprolol, lisinopril, lasix IV BID. Hydralazine added, titrated upwards today.  - Discussed dietary and lifestyle modification, dietitian consulted.   Hypokalemia: Worsened by loop diuresis.  - Supplement again today BID - Consider hyperaldosteronism evaluation as outpatient  Hyperlipidemia:  - Dietary changes and lifestyle changes recommended, will need PCP follow up  Prediabetes: HbA1c 5.7%.  - Dietitian consulted.  Chest pain, type II NSTEMI: Suspected due to demand myocardial ischemia in setting of hypertensive urgency. Chest pain is atypical for angina. Troponin down-trending.  - Cardiology consulted, deferring ischemic evaluation to outpatient setting - No regional wall motion abnormalities, but global hypokinesis  noted as above.  Obesity: Body mass index is 41.21 kg/m.   Insomnia:  - Trazodone qHS  DVT prophylaxis: Lovenox Code Status: Full Family Communication: None at bedside Disposition Plan: Patient will return home pending cardiology signing off, still requiring IV diuresis.   Consultants:   Cardiology  Procedures:   Echocardiogram 4/6  Antimicrobials:  None   Subjective: Had a bad night again, got < 1 hr total of sleep between pain in the left-center of the chest worse when laying on left side, improved sitting in chair, associated with dyspnea. Overall minimal improvement over past 24 hours but making a lot of urine.   Objective: Vitals:   09/15/19 0358 09/15/19 0754 09/15/19 1135 09/15/19 1449  BP: (!) 150/109 (!) 146/110 (!) 154/108 (!) 152/108  Pulse: 93 97 90 93  Resp:  18 18   Temp:  (!) 97.5 F (36.4 C) 97.9 F (36.6 C) 97.7 F (36.5 C)  TempSrc:  Oral Oral Oral  SpO2: 100% 98% 99% 100%  Weight:      Height:        Intake/Output Summary (Last 24 hours) at 09/15/2019 1522 Last data filed at 09/15/2019 1100 Gross per 24 hour  Intake 720 ml  Output 3400 ml  Net -2680 ml   Filed Weights   09/13/19 0252 09/14/19 0433 09/15/19 0354  Weight: 122.9 kg 123 kg 122.4 kg   Gen: Pleasant obese young male in no distress Pulm: Nonlabored breathing room air. Clear. CV: Regular rate and rhythm. No murmur, rub, or gallop. No significant dependent edema. GI: Abdomen soft, non-tender, non-distended, with normoactive bowel sounds.  Ext: Warm, no deformities Skin: No rashes, lesions or ulcers on visualized skin. Neuro: Alert and oriented. No focal neurological  deficits. Psych: Judgement and insight appear fair. Mood euthymic & affect congruent. Behavior is appropriate.    Data Reviewed: I have personally reviewed following labs and imaging studies  CBC: Recent Labs  Lab 09/13/19 0314  WBC 8.6  HGB 15.0  HCT 46.1  MCV 83.4  PLT 191   Basic Metabolic  Panel: Recent Labs  Lab 09/13/19 0314 09/14/19 0403 09/15/19 0503  NA 140 143 139  K 3.3* 3.4* 3.1*  CL 104 105 104  CO2 28 30 27   GLUCOSE 120* 98 101*  BUN 16 16 21*  CREATININE 1.24 1.37* 1.49*  CALCIUM 8.8* 8.7* 8.6*  MG  --  2.4  --    GFR: Estimated Creatinine Clearance: 90.6 mL/min (A) (by C-G formula based on SCr of 1.49 mg/dL (H)).  HbA1C: Recent Labs    09/14/19 0403  HGBA1C 5.7*   CBG: Recent Labs  Lab 09/13/19 2309  GLUCAP 92   Lipid Profile: Recent Labs    09/14/19 0403  CHOL 192  HDL 29*  LDLCALC 122*  TRIG 207*  CHOLHDL 6.6   Thyroid Function Tests: Recent Labs    09/13/19 0314  TSH 2.206  FREET4 0.96   Anemia Panel: No results for input(s): VITAMINB12, FOLATE, FERRITIN, TIBC, IRON, RETICCTPCT in the last 72 hours. Urine analysis:    Component Value Date/Time   COLORURINE YELLOW (A) 01/23/2017 1643   APPEARANCEUR CLEAR (A) 01/23/2017 1643   LABSPEC >1.046 (H) 01/23/2017 1643   PHURINE 7.0 01/23/2017 1643   GLUCOSEU NEGATIVE 01/23/2017 1643   HGBUR NEGATIVE 01/23/2017 1643   BILIRUBINUR NEGATIVE 01/23/2017 1643   KETONESUR NEGATIVE 01/23/2017 1643   PROTEINUR 30 (A) 01/23/2017 1643   NITRITE NEGATIVE 01/23/2017 1643   LEUKOCYTESUR NEGATIVE 01/23/2017 1643   Recent Results (from the past 240 hour(s))  SARS CORONAVIRUS 2 (TAT 6-24 HRS) Nasopharyngeal Nasopharyngeal Swab     Status: None   Collection Time: 09/13/19  5:24 AM   Specimen: Nasopharyngeal Swab  Result Value Ref Range Status   SARS Coronavirus 2 NEGATIVE NEGATIVE Final    Comment: (NOTE) SARS-CoV-2 target nucleic acids are NOT DETECTED. The SARS-CoV-2 RNA is generally detectable in upper and lower respiratory specimens during the acute phase of infection. Negative results do not preclude SARS-CoV-2 infection, do not rule out co-infections with other pathogens, and should not be used as the sole basis for treatment or other patient management decisions. Negative results  must be combined with clinical observations, patient history, and epidemiological information. The expected result is Negative. Fact Sheet for Patients: 11/13/19 Fact Sheet for Healthcare Providers: HairSlick.no This test is not yet approved or cleared by the quierodirigir.com FDA and  has been authorized for detection and/or diagnosis of SARS-CoV-2 by FDA under an Emergency Use Authorization (EUA). This EUA will remain  in effect (meaning this test can be used) for the duration of the COVID-19 declaration under Section 56 4(b)(1) of the Act, 21 U.S.C. section 360bbb-3(b)(1), unless the authorization is terminated or revoked sooner. Performed at [facility], [address] [place], [place]       Radiology Studies: ECHOCARDIOGRAM COMPLETE  Result Date: 09/14/2019    ECHOCARDIOGRAM REPORT   Patient Name:   Benjamin Goodman. Date of Exam: 09/14/2019 Medical Rec #:  11/14/2019            Height:       68.0 in Accession #:    [id number]  Weight:       271.2 lb Date of Birth:  [birth year]            BSA:          2.326 m Patient Age:    33 years             BP:           139/99 mmHg Patient Gender: M                    HR:           100 bpm. Exam Location:  [facility] Procedure: 2D Echo, Color Doppler and Cardiac Doppler Indications:     I50.31 CHF-Acute Diastolic  History:         Patient has prior history of Echocardiogram examinations.                  Signs/Symptoms:Headache and Chest Pain; Risk                  Factors:Hypertension.  Sonographer:     Humphrey RollsJoan Heiss RDCS (AE) Referring Phys:  [id number] Andris BaumannHAZEL V DUNCAN Diagnosing Phys: Yvonne Kendallhristopher End MD IMPRESSIONS  1. Left ventricular ejection fraction, by estimation, is 35 to 40%. The left ventricle has moderately decreased function. The left ventricle demonstrates global hypokinesis. There is moderate left ventricular hypertrophy. Left ventricular diastolic parameters are  consistent with Grade III diastolic dysfunction (restrictive).  2. Right ventricular systolic function is mildly reduced. The right ventricular size is normal. Tricuspid regurgitation signal is inadequate for assessing PA pressure.  3. Left atrial size was mildly dilated.  4. The mitral valve is normal in structure. Trivial mitral valve regurgitation. No evidence of mitral stenosis.  5. The aortic valve was not well visualized. Aortic valve regurgitation is not visualized. No aortic stenosis is present.  6. The inferior vena cava is dilated in size with <50% respiratory variability, suggesting right atrial pressure of 15 mmHg. FINDINGS  Left Ventricle: Left ventricular ejection fraction, by estimation, is 35 to 40%. The left ventricle has moderately decreased function. The left ventricle demonstrates global hypokinesis. The left ventricular internal cavity size was normal in size. There is moderate left ventricular hypertrophy. Left ventricular diastolic parameters are consistent with Grade III diastolic dysfunction (restrictive). Right Ventricle: The right ventricular size is normal. No increase in right ventricular wall thickness. Right ventricular systolic function is mildly reduced. Tricuspid regurgitation signal is inadequate for assessing PA pressure. Left Atrium: Left atrial size was mildly dilated. Right Atrium: Right atrial size was normal in size. Pericardium: There is no evidence of pericardial effusion. Mitral Valve: The mitral valve is normal in structure. Trivial mitral valve regurgitation. No evidence of mitral valve stenosis. MV peak gradient, 5.5 mmHg. The mean mitral valve gradient is 3.0 mmHg. Tricuspid Valve: The tricuspid valve is grossly normal. Tricuspid valve regurgitation is not demonstrated. Aortic Valve: The aortic valve was not well visualized. Aortic valve regurgitation is not visualized. No aortic stenosis is present. Aortic valve mean gradient measures 2.0 mmHg. Aortic valve peak  gradient measures 3.5 mmHg. Aortic valve area, by VTI measures 2.14 cm. Pulmonic Valve: The pulmonic valve was grossly normal. Pulmonic valve regurgitation is not visualized. No evidence of pulmonic stenosis. Aorta: The aortic root is normal in size and structure. Pulmonary Artery: The pulmonary artery is of normal size. Venous: The inferior vena cava is dilated in size with less than 50% respiratory variability, suggesting right atrial pressure of 15 mmHg. IAS/Shunts:  The interatrial septum was not well visualized.  LEFT VENTRICLE PLAX 2D LVIDd:         5.26 cm      Diastology LVIDs:         4.03 cm      LV e' lateral:   6.53 cm/s LV PW:         1.52 cm      LV E/e' lateral: 14.1 LV IVS:        1.35 cm      LV e' medial:    6.53 cm/s LVOT diam:     2.10 cm      LV E/e' medial:  14.1 LV SV:         28 LV SV Index:   12 LVOT Area:     3.46 cm  LV Volumes (MOD) LV vol d, MOD A2C: 112.0 ml LV vol d, MOD A4C: 141.0 ml LV vol s, MOD A2C: 69.4 ml LV vol s, MOD A4C: 87.9 ml LV SV MOD A2C:     42.6 ml LV SV MOD A4C:     141.0 ml LV SV MOD BP:      45.3 ml RIGHT VENTRICLE RV Basal diam:  3.98 cm LEFT ATRIUM             Index       RIGHT ATRIUM           Index LA diam:        4.70 cm 2.02 cm/m  RA Area:     16.80 cm LA Vol (A2C):   54.9 ml 23.60 ml/m RA Volume:   40.30 ml  17.32 ml/m LA Vol (A4C):   77.5 ml 33.32 ml/m LA Biplane Vol: 68.0 ml 29.23 ml/m  AORTIC VALVE                   PULMONIC VALVE AV Area (Vmax):    2.08 cm    PV Vmax:       0.66 m/s AV Area (Vmean):   2.01 cm    PV Vmean:      47.200 cm/s AV Area (VTI):     2.14 cm    PV VTI:        0.120 m AV Vmax:           93.70 cm/s  PV Peak grad:  1.8 mmHg AV Vmean:          60.400 cm/s PV Mean grad:  1.0 mmHg AV VTI:            0.129 m AV Peak Grad:      3.5 mmHg AV Mean Grad:      2.0 mmHg LVOT Vmax:         56.20 cm/s LVOT Vmean:        35.100 cm/s LVOT VTI:          0.080 m LVOT/AV VTI ratio: 0.62  AORTA Ao Root diam: 3.30 cm MITRAL VALVE MV Area (PHT):  6.73 cm    SHUNTS MV Peak grad:  5.5 mmHg    Systemic VTI:  0.08 m MV Mean grad:  3.0 mmHg    Systemic Diam: 2.10 cm MV Vmax:       1.17 m/s MV Vmean:      74.6 cm/s MV Decel Time: 113 msec MV E velocity: 91.83 cm/s MV A velocity: 0.97 cm/s MV E/A ratio:  94.67 Harrell Gave End MD Electronically signed by Nelva Bush MD Signature Date/Time: 09/14/2019/2:38:09 PM  Final     Scheduled Meds: . enoxaparin (LOVENOX) injection  40 mg Subcutaneous Q12H  . furosemide  40 mg Intravenous Q12H  . hydrALAZINE  50 mg Oral TID  . lisinopril  20 mg Oral Daily  . metoprolol succinate  50 mg Oral Daily  . potassium chloride  40 mEq Oral BID  . sodium chloride flush  3 mL Intravenous Once  . sodium chloride flush  3 mL Intravenous Q12H   Continuous Infusions: . sodium chloride       LOS: 2 days   Time spent: 25 minutes.  Tyrone Nine, MD [facility] [email or web address] 09/15/2019, 3:22 PM

## 2019-09-16 LAB — CBC
HCT: 47.4 % (ref 39.0–52.0)
Hemoglobin: 15.7 g/dL (ref 13.0–17.0)
MCH: 27.5 pg (ref 26.0–34.0)
MCHC: 33.1 g/dL (ref 30.0–36.0)
MCV: 83 fL (ref 80.0–100.0)
Platelets: 211 10*3/uL (ref 150–400)
RBC: 5.71 MIL/uL (ref 4.22–5.81)
RDW: 15.3 % (ref 11.5–15.5)
WBC: 9.5 10*3/uL (ref 4.0–10.5)
nRBC: 0 % (ref 0.0–0.2)

## 2019-09-16 LAB — BASIC METABOLIC PANEL
Anion gap: 8 (ref 5–15)
BUN: 22 mg/dL — ABNORMAL HIGH (ref 6–20)
CO2: 27 mmol/L (ref 22–32)
Calcium: 8.9 mg/dL (ref 8.9–10.3)
Chloride: 105 mmol/L (ref 98–111)
Creatinine, Ser: 1.5 mg/dL — ABNORMAL HIGH (ref 0.61–1.24)
GFR calc Af Amer: >60 mL/min (ref >60)
GFR calc non Af Amer: >60 mL/min (ref >60)
Glucose, Bld: 102 mg/dL — ABNORMAL HIGH (ref 70–99)
Potassium: 4.1 mmol/L (ref 3.5–5.1)
Sodium: 140 mmol/L (ref 135–145)

## 2019-09-16 LAB — MAGNESIUM: Magnesium: 2.3 mg/dL (ref 1.7–2.4)

## 2019-09-16 MED ORDER — FUROSEMIDE 40 MG PO TABS: 40.0000 mg | ORAL_TABLET | Freq: Every day | ORAL | 0 refills | Status: DC

## 2019-09-16 MED ORDER — METOPROLOL SUCCINATE ER 50 MG PO TB24: 50.0000 mg | ORAL_TABLET | Freq: Every day | ORAL | 0 refills | Status: DC

## 2019-09-16 MED ORDER — POTASSIUM CHLORIDE ER 20 MEQ PO TBCR: 20.0000 meq | EXTENDED_RELEASE_TABLET | Freq: Two times a day (BID) | ORAL | 0 refills | Status: DC

## 2019-09-16 MED ORDER — LISINOPRIL 20 MG PO TABS: 20.0000 mg | ORAL_TABLET | Freq: Every day | ORAL | 0 refills | Status: DC

## 2019-09-16 MED ORDER — HYDRALAZINE HCL 50 MG PO TABS: 50.0000 mg | ORAL_TABLET | Freq: Three times a day (TID) | ORAL | 0 refills | Status: DC

## 2019-09-16 NOTE — Progress Notes (Signed)
[facility] Cardiology    SUBJECTIVE: Patient reports feeling much better this morning.   Vitals:   09/15/19 2127 09/16/19 0032 09/16/19 0514 09/16/19 0723  BP: 136/74 (!) 146/87 (!) 144/101 139/90  Pulse: 88 (!) 107 93 94  Resp:    19  Temp:  98.9 F (37.2 C) 97.6 F (36.4 C) 98.7 F (37.1 C)  TempSrc:  Oral Oral Oral  SpO2:  98% 98% 99%  Weight:   120.4 kg   Height:         Intake/Output Summary (Last 24 hours) at 09/16/2019 1191 Last data filed at 09/16/2019 0538 Gross per 24 hour  Intake 840 ml  Output 1800 ml  Net -960 ml      PHYSICAL EXAM  General: Well developed, well nourished, in no acute distress, sitting on side of bed eating breakfast HEENT:  Normocephalic and atramatic Neck:  No JVD.  Lungs: normal effort of breathing on room air. Heart: HRRR . Normal S1 and S2 without gallops or murmurs.  Abdomen: nondistended Msk:  Gait not assessed. Back normal. Normal strength and tone for age. Extremities: No clubbing, cyanosis or edema.   Neuro: Alert and oriented X 3. Psych:  Good affect, responds appropriately   LABS: Basic Metabolic Panel: Recent Labs    09/14/19 0403 09/14/19 0403 09/15/19 0503 09/16/19 0433  NA 143   < > 139 140  K 3.4*   < > 3.1* 4.1  CL 105   < > 104 105  CO2 30   < > 27 27  GLUCOSE 98   < > 101* 102*  BUN 16   < > 21* 22*  CREATININE 1.37*   < > 1.49* 1.50*  CALCIUM 8.7*   < > 8.6* 8.9  MG 2.4  --   --  2.3   < > = values in this interval not displayed.   Liver Function Tests: No results for input(s): AST, ALT, ALKPHOS, BILITOT, PROT, ALBUMIN in the last 72 hours. No results for input(s): LIPASE, AMYLASE in the last 72 hours. CBC: Recent Labs    09/16/19 0433  WBC 9.5  HGB 15.7  HCT 47.4  MCV 83.0  PLT 211   Cardiac Enzymes: No results for input(s): CKTOTAL, CKMB, CKMBINDEX, TROPONINI in the last 72 hours. BNP: Invalid input(s): POCBNP D-Dimer: No results for input(s): DDIMER in the last 72 hours. Hemoglobin  A1C: Recent Labs    09/14/19 0403  HGBA1C 5.7*   Fasting Lipid Panel: Recent Labs    09/14/19 0403  CHOL 192  HDL 29*  LDLCALC 122*  TRIG 207*  CHOLHDL 6.6   Thyroid Function Tests: No results for input(s): TSH, T4TOTAL, T3FREE, THYROIDAB in the last 72 hours.  Invalid input(s): FREET3 Anemia Panel: No results for input(s): VITAMINB12, FOLATE, FERRITIN, TIBC, IRON, RETICCTPCT in the last 72 hours.  ECHOCARDIOGRAM COMPLETE  Result Date: 09/14/2019    ECHOCARDIOGRAM REPORT   Patient Name:   Benjamin Goodman. Date of Exam: 09/14/2019 Medical Rec #:  [id number]            Height:       68.0 in Accession #:    [id number]           Weight:       271.2 lb Date of Birth:  [birth year]            BSA:          2.326 m Patient Age:    33 years  BP:           139/99 mmHg Patient Gender: M                    HR:           100 bpm. Exam Location:  [facility] Procedure: 2D Echo, Color Doppler and Cardiac Doppler Indications:     I50.31 CHF-Acute Diastolic  History:         Patient has prior history of Echocardiogram examinations.                  Signs/Symptoms:Headache and Chest Pain; Risk                  Factors:Hypertension.  Sonographer:     Humphrey Rolls RDCS (AE) Referring Phys:  [id number] Andris Baumann Diagnosing Phys: Yvonne Kendall MD IMPRESSIONS  1. Left ventricular ejection fraction, by estimation, is 35 to 40%. The left ventricle has moderately decreased function. The left ventricle demonstrates global hypokinesis. There is moderate left ventricular hypertrophy. Left ventricular diastolic parameters are consistent with Grade III diastolic dysfunction (restrictive).  2. Right ventricular systolic function is mildly reduced. The right ventricular size is normal. Tricuspid regurgitation signal is inadequate for assessing PA pressure.  3. Left atrial size was mildly dilated.  4. The mitral valve is normal in structure. Trivial mitral valve regurgitation. No evidence of mitral stenosis.  5. The  aortic valve was not well visualized. Aortic valve regurgitation is not visualized. No aortic stenosis is present.  6. The inferior vena cava is dilated in size with <50% respiratory variability, suggesting right atrial pressure of 15 mmHg. FINDINGS  Left Ventricle: Left ventricular ejection fraction, by estimation, is 35 to 40%. The left ventricle has moderately decreased function. The left ventricle demonstrates global hypokinesis. The left ventricular internal cavity size was normal in size. There is moderate left ventricular hypertrophy. Left ventricular diastolic parameters are consistent with Grade III diastolic dysfunction (restrictive). Right Ventricle: The right ventricular size is normal. No increase in right ventricular wall thickness. Right ventricular systolic function is mildly reduced. Tricuspid regurgitation signal is inadequate for assessing PA pressure. Left Atrium: Left atrial size was mildly dilated. Right Atrium: Right atrial size was normal in size. Pericardium: There is no evidence of pericardial effusion. Mitral Valve: The mitral valve is normal in structure. Trivial mitral valve regurgitation. No evidence of mitral valve stenosis. MV peak gradient, 5.5 mmHg. The mean mitral valve gradient is 3.0 mmHg. Tricuspid Valve: The tricuspid valve is grossly normal. Tricuspid valve regurgitation is not demonstrated. Aortic Valve: The aortic valve was not well visualized. Aortic valve regurgitation is not visualized. No aortic stenosis is present. Aortic valve mean gradient measures 2.0 mmHg. Aortic valve peak gradient measures 3.5 mmHg. Aortic valve area, by VTI measures 2.14 cm. Pulmonic Valve: The pulmonic valve was grossly normal. Pulmonic valve regurgitation is not visualized. No evidence of pulmonic stenosis. Aorta: The aortic root is normal in size and structure. Pulmonary Artery: The pulmonary artery is of normal size. Venous: The inferior vena cava is dilated in size with less than 50%  respiratory variability, suggesting right atrial pressure of 15 mmHg. IAS/Shunts: The interatrial septum was not well visualized.  LEFT VENTRICLE PLAX 2D LVIDd:         5.26 cm      Diastology LVIDs:         4.03 cm      LV e' lateral:   6.53 cm/s LV PW:  1.52 cm      LV E/e' lateral: 14.1 LV IVS:        1.35 cm      LV e' medial:    6.53 cm/s LVOT diam:     2.10 cm      LV E/e' medial:  14.1 LV SV:         28 LV SV Index:   12 LVOT Area:     3.46 cm  LV Volumes (MOD) LV vol d, MOD A2C: 112.0 ml LV vol d, MOD A4C: 141.0 ml LV vol s, MOD A2C: 69.4 ml LV vol s, MOD A4C: 87.9 ml LV SV MOD A2C:     42.6 ml LV SV MOD A4C:     141.0 ml LV SV MOD BP:      45.3 ml RIGHT VENTRICLE RV Basal diam:  3.98 cm LEFT ATRIUM             Index       RIGHT ATRIUM           Index LA diam:        4.70 cm 2.02 cm/m  RA Area:     16.80 cm LA Vol (A2C):   54.9 ml 23.60 ml/m RA Volume:   40.30 ml  17.32 ml/m LA Vol (A4C):   77.5 ml 33.32 ml/m LA Biplane Vol: 68.0 ml 29.23 ml/m  AORTIC VALVE                   PULMONIC VALVE AV Area (Vmax):    2.08 cm    PV Vmax:       0.66 m/s AV Area (Vmean):   2.01 cm    PV Vmean:      47.200 cm/s AV Area (VTI):     2.14 cm    PV VTI:        0.120 m AV Vmax:           93.70 cm/s  PV Peak grad:  1.8 mmHg AV Vmean:          60.400 cm/s PV Mean grad:  1.0 mmHg AV VTI:            0.129 m AV Peak Grad:      3.5 mmHg AV Mean Grad:      2.0 mmHg LVOT Vmax:         56.20 cm/s LVOT Vmean:        35.100 cm/s LVOT VTI:          0.080 m LVOT/AV VTI ratio: 0.62  AORTA Ao Root diam: 3.30 cm MITRAL VALVE MV Area (PHT): 6.73 cm    SHUNTS MV Peak grad:  5.5 mmHg    Systemic VTI:  0.08 m MV Mean grad:  3.0 mmHg    Systemic Diam: 2.10 cm MV Vmax:       1.17 m/s MV Vmean:      74.6 cm/s MV Decel Time: 113 msec MV E velocity: 91.83 cm/s MV A velocity: 0.97 cm/s MV E/A ratio:  94.67 Christopher End MD Electronically signed by Yvonne Kendall MD Signature Date/Time: 09/14/2019/2:38:09 PM    Final     Echo: LVEF  35-40% with global hypokinesis, moderate LVH, grade III diastolic dysfunction  TELEMETRY: sinus rhythm, 94 bpm  ASSESSMENT AND PLAN:  Active Problems:   Chest pain   Obesity, Class III, BMI 40-49.9 (morbid obesity) (HCC)   Elevated troponin   Hypertensive emergency   Hypertensive urgency  1. Hypertensive urgency, on metoprolol tartrate, lisinopril, IV Lasix, and hydralazine 50 mg TID with improvement of blood pressure today. 2. Borderline elevated troponin, likely secondary to demand supply ischemia in the setting of hypertensive urgency. ECG with nonspecific ST-T wave abnormalities. Troponin down-trending (52- 45- 39).  3. Acute HFrEF, echocardiogram reveals LVEF 35-40% with global hypokinesis, moderate LVH, and grade III diastolic dysfunction. Likely due to uncontrolled longstanding hypertension. Previous echocardiogram in 2018 without EF listed, though noted as normal in cardiologist's progress note. Increased urine output and improvement of symptoms on IV Lasix 40 mg BID. BNP 210--> 316. 4. Chest pain, atypical and positional 5. Obesity 6. Hypokalemia, improved this morning with supplementation BID 7. AKI secondary to diuretics, creatinine 1.50 today  Recommendations: 1. Continue metoprolol succinate 50 mg daily, hydralazine 50 mg TID, and lisinopril 20 mg daily 2. Continue BID potassium supplement, and continue at discharge 3. Will plan to order [facility] as outpatient to assess reduced EF 4. Discontinue IV Lasix and transition to oral Lasix 40 mg daily, and continue at discharge 5. Recommend discharge today from cardiovascular perspective, and follow-up with Dr. Saralyn Pilar in 1 week.  6. Refer to Heart Failure Clinic as outpatient  Discussed with Dr. Saralyn Pilar; plan made in collaboration with him. Sign off for now; please call with any questions.  Clabe Seal, PA-C 09/16/2019 8:22 AM

## 2019-09-16 NOTE — Progress Notes (Signed)
Discharge instructions explained to pt/ verbalized an understanding/ iv and tele removed/ will transport off unit via wheelchair.  

## 2019-09-16 NOTE — Discharge Summary (Addendum)
Physician Discharge Summary  Elmer Bales. XFG:[id number] DOB: 04-16-1986 DOA: 09/13/2019  PCP: Patient, No Pcp Per  Admit date: 09/13/2019 Discharge date: 09/16/2019  Admitted From: Home Disposition: Home   Recommendations for Outpatient Follow-up:  1. Follow up with PCP in 1-2 weeks 2. Please obtain BMP at follow up, referred to HF clinic. 3. Consider hyperaldosteronism evaluation as outpatient  Home Health: None Equipment/Devices: None Discharge Condition: Stable CODE STATUS: Full Diet recommendation: Heart healthy  Brief/Interim Summary: Jill Poling Jr.is a34 y.o.malewith a history of HTN and incomplete adherence/follow up presenting with 1 week of progressive midline chest pain and sudden onset shortness of breath that awoke him this morning. He presented to the ED, found to be severely hypertensive (212/416) with tachycardia, tachypnea, in no respiratory distress, 98% on room air. Troponin mildly elevated at 52, nonspecific ST-T changes on ECG, d-dimer normal, BNP 210. CXR showed cardiomegaly and early interstitial prominence concerning for pulmonary edema. IV labetalol and SL NTG were given with improvement in pain and lowering BP.He was admitted with cardiology consultation and chest pain, dyspnea improved with uptitration of antihypertensives and diuresis.  Discharge Diagnoses:  Active Problems:   Chest pain   Obesity, Class III, BMI 40-49.9 (morbid obesity) (HCC)   Elevated troponin   Hypertensive urgency  Acute combined HFrEF: Echo shows LVEF 35-40%, G3DD, dilated IVC and mildly reduced RV systolic function.  1. Continue metoprolol succinate 50 mg daily, hydralazine 50 mg TID, and lisinopril 20 mg daily 2. Continue BID potassium supplement, and continue at discharge 3. Will plan to order [facility] as outpatient to assess reduced EF 4. Discontinue IV Lasix and transition to oral Lasix 40 mg daily, and continue at discharge 5. Recommend discharge today  from cardiovascular perspective, and follow-up with Dr. Darrold Junker in 1 week.  6. Refer to Heart Failure Clinic as outpatient  Uncontrolled essential HTN with hypertensive urgency: Tx as above - Discussed dietary and lifestyle modification, dietitian consulted.   Hypokalemia: Worsened by loop diuresis.  - Supplement again regularly (Rx at DC) - Consider hyperaldosteronism evaluation as outpatient  Hyperlipidemia:  - Dietary changes and lifestyle changes recommended, will need PCP follow up  Prediabetes: HbA1c 5.7%.  - Dietitian consulted.  Chest pain, type II NSTEMI: Suspected due to demand myocardial ischemia in setting of hypertensive urgency. Chest pain is atypical for angina. Troponin down-trending.  - Cardiology consulted, deferring ischemic evaluation to outpatient setting - No regional wall motion abnormalities, but global hypokinesis noted as above.  Obesity:Body mass index is 41.21 kg/m.  Insomnia:  - Consider Trazodone qHS  Discharge Instructions Discharge Instructions    (HEART FAILURE PATIENTS) Call MD:  Anytime you have any of the following symptoms: 1) 3 pound weight gain in 24 hours or 5 pounds in 1 week 2) shortness of breath, with or without a dry hacking cough 3) swelling in the hands, feet or stomach 4) if you have to sleep on extra pillows at night in order to breathe.   Complete by: As directed    Diet - low sodium heart healthy   Complete by: As directed    Discharge instructions   Complete by: As directed    1. Continue metoprolol succinate 50 mg daily, hydralazine 50 mg TID, and lisinopril 20 mg daily 2. Continue twice daily potassium supplement 3. Will plan to order [facility] as outpatient to assess reduced EF. Heart failure clinic follow up is scheduled. 4. Continue taking oral Lasix 40 mg daily  5. If  your symptoms return/worsen, seek medical attention right away.   Increase activity slowly   Complete by: As directed      Allergies  as of 09/16/2019   No Known Allergies     Medication List    STOP taking these medications   metoprolol tartrate 50 MG tablet Commonly known as: LOPRESSOR     TAKE these medications   aspirin 325 MG tablet Take 325 mg by mouth in the morning and at bedtime.   furosemide 40 MG tablet Commonly known as: Lasix Take 1 tablet (40 mg total) by mouth daily.   hydrALAZINE 50 MG tablet Commonly known as: APRESOLINE Take 1 tablet (50 mg total) by mouth 3 (three) times daily. What changed: when to take this   lisinopril 20 MG tablet Commonly known as: ZESTRIL Take 1 tablet (20 mg total) by mouth daily.   metoprolol succinate 50 MG 24 hr tablet Commonly known as: TOPROL-XL Take 1 tablet (50 mg total) by mouth daily.   MULTIVITAMIN MEN PO Take 1 tablet by mouth daily.   Potassium Chloride ER 20 MEQ Tbcr Take 20 mEq by mouth 2 (two) times daily.      Follow-up Information    [facility] Follow up on 09/24/2019.   Specialty: Cardiology Why: at 9:00am. Enter through the Medical Mall entrance Contact information: [address] [place] [address] [phone]       Marcina Millard, MD. Go on 09/23/2019.   Specialty: Cardiology Why: Appointment at 2:45pm Contact information: [address] [facility] [place] [address] [phone]          No Known Allergies  Consultations:  Cardiology  Procedures/Studies: DG Chest 2 View  Result Date: 09/13/2019 CLINICAL DATA:  Chest pain EXAM: CHEST - 2 VIEW COMPARISON:  January 25, 2017 FINDINGS: The cardiac silhouette is enlarged. There are prominent interstitial lung markings bilaterally. There is no pneumothorax. No convincing pleural effusion. There is some fluid within the fissures. There are few small subtle airspace opacities bilaterally. IMPRESSION: Cardiomegaly with findings suggestive of developing pulmonary edema.  An underlying atypical infectious process is not entirely excluded. Electronically Signed   By: Katherine Mantle M.D.   On: 09/13/2019 03:37   ECHOCARDIOGRAM COMPLETE  Result Date: 09/14/2019    ECHOCARDIOGRAM REPORT   Patient Name:   Isaiyah Feldhaus. Date of Exam: 09/14/2019 Medical Rec #:  [id number]            Height:       68.0 in Accession #:    [id number]           Weight:       271.2 lb Date of Birth:  [birth year]            BSA:          2.326 m Patient Age:    33 years             BP:           139/99 mmHg Patient Gender: M                    HR:           100 bpm. Exam Location:  [facility] Procedure: 2D Echo, Color Doppler and Cardiac Doppler Indications:     I50.31 CHF-Acute Diastolic  History:         Patient has prior history of Echocardiogram examinations.  Signs/Symptoms:Headache and Chest Pain; Risk                  Factors:Hypertension.  Sonographer:     Humphrey Rolls RDCS (AE) Referring Phys:  [id number] Andris Baumann Diagnosing Phys: Yvonne Kendall MD IMPRESSIONS  1. Left ventricular ejection fraction, by estimation, is 35 to 40%. The left ventricle has moderately decreased function. The left ventricle demonstrates global hypokinesis. There is moderate left ventricular hypertrophy. Left ventricular diastolic parameters are consistent with Grade III diastolic dysfunction (restrictive).  2. Right ventricular systolic function is mildly reduced. The right ventricular size is normal. Tricuspid regurgitation signal is inadequate for assessing PA pressure.  3. Left atrial size was mildly dilated.  4. The mitral valve is normal in structure. Trivial mitral valve regurgitation. No evidence of mitral stenosis.  5. The aortic valve was not well visualized. Aortic valve regurgitation is not visualized. No aortic stenosis is present.  6. The inferior vena cava is dilated in size with <50% respiratory variability, suggesting right atrial pressure of 15 mmHg. FINDINGS  Left Ventricle: Left  ventricular ejection fraction, by estimation, is 35 to 40%. The left ventricle has moderately decreased function. The left ventricle demonstrates global hypokinesis. The left ventricular internal cavity size was normal in size. There is moderate left ventricular hypertrophy. Left ventricular diastolic parameters are consistent with Grade III diastolic dysfunction (restrictive). Right Ventricle: The right ventricular size is normal. No increase in right ventricular wall thickness. Right ventricular systolic function is mildly reduced. Tricuspid regurgitation signal is inadequate for assessing PA pressure. Left Atrium: Left atrial size was mildly dilated. Right Atrium: Right atrial size was normal in size. Pericardium: There is no evidence of pericardial effusion. Mitral Valve: The mitral valve is normal in structure. Trivial mitral valve regurgitation. No evidence of mitral valve stenosis. MV peak gradient, 5.5 mmHg. The mean mitral valve gradient is 3.0 mmHg. Tricuspid Valve: The tricuspid valve is grossly normal. Tricuspid valve regurgitation is not demonstrated. Aortic Valve: The aortic valve was not well visualized. Aortic valve regurgitation is not visualized. No aortic stenosis is present. Aortic valve mean gradient measures 2.0 mmHg. Aortic valve peak gradient measures 3.5 mmHg. Aortic valve area, by VTI measures 2.14 cm. Pulmonic Valve: The pulmonic valve was grossly normal. Pulmonic valve regurgitation is not visualized. No evidence of pulmonic stenosis. Aorta: The aortic root is normal in size and structure. Pulmonary Artery: The pulmonary artery is of normal size. Venous: The inferior vena cava is dilated in size with less than 50% respiratory variability, suggesting right atrial pressure of 15 mmHg. IAS/Shunts: The interatrial septum was not well visualized.  LEFT VENTRICLE PLAX 2D LVIDd:         5.26 cm      Diastology LVIDs:         4.03 cm      LV e' lateral:   6.53 cm/s LV PW:         1.52 cm      LV  E/e' lateral: 14.1 LV IVS:        1.35 cm      LV e' medial:    6.53 cm/s LVOT diam:     2.10 cm      LV E/e' medial:  14.1 LV SV:         28 LV SV Index:   12 LVOT Area:     3.46 cm  LV Volumes (MOD) LV vol d, MOD A2C: 112.0 ml LV vol d, MOD A4C: 141.0 ml  LV vol s, MOD A2C: 69.4 ml LV vol s, MOD A4C: 87.9 ml LV SV MOD A2C:     42.6 ml LV SV MOD A4C:     141.0 ml LV SV MOD BP:      45.3 ml RIGHT VENTRICLE RV Basal diam:  3.98 cm LEFT ATRIUM             Index       RIGHT ATRIUM           Index LA diam:        4.70 cm 2.02 cm/m  RA Area:     16.80 cm LA Vol (A2C):   54.9 ml 23.60 ml/m RA Volume:   40.30 ml  17.32 ml/m LA Vol (A4C):   77.5 ml 33.32 ml/m LA Biplane Vol: 68.0 ml 29.23 ml/m  AORTIC VALVE                   PULMONIC VALVE AV Area (Vmax):    2.08 cm    PV Vmax:       0.66 m/s AV Area (Vmean):   2.01 cm    PV Vmean:      47.200 cm/s AV Area (VTI):     2.14 cm    PV VTI:        0.120 m AV Vmax:           93.70 cm/s  PV Peak grad:  1.8 mmHg AV Vmean:          60.400 cm/s PV Mean grad:  1.0 mmHg AV VTI:            0.129 m AV Peak Grad:      3.5 mmHg AV Mean Grad:      2.0 mmHg LVOT Vmax:         56.20 cm/s LVOT Vmean:        35.100 cm/s LVOT VTI:          0.080 m LVOT/AV VTI ratio: 0.62  AORTA Ao Root diam: 3.30 cm MITRAL VALVE MV Area (PHT): 6.73 cm    SHUNTS MV Peak grad:  5.5 mmHg    Systemic VTI:  0.08 m MV Mean grad:  3.0 mmHg    Systemic Diam: 2.10 cm MV Vmax:       1.17 m/s MV Vmean:      74.6 cm/s MV Decel Time: 113 msec MV E velocity: 91.83 cm/s MV A velocity: 0.97 cm/s MV E/A ratio:  94.67 Christopher End MD Electronically signed by Yvonne Kendallhristopher End MD Signature Date/Time: 09/14/2019/2:38:09 PM    Final      Subjective: Feels much better, "normal" this morning. No current chest pain, slept better. Wants to go home.  Discharge Exam: Vitals:   09/16/19 0514 09/16/19 0723  BP: (!) 144/101 139/90  Pulse: 93 94  Resp:  19  Temp: 97.6 F (36.4 C) 98.7 F (37.1 C)  SpO2: 98% 99%    General: Pt is alert, awake, not in acute distress Cardiovascular: RRR, S1/S2 +, no rubs, no gallops Respiratory: CTA bilaterally, no wheezing, no rhonchi Abdominal: Soft, NT, ND, bowel sounds + Extremities: No edema, no cyanosis  Labs: BNP (last 3 results) Recent Labs    09/13/19 0314 09/15/19 0503  BNP 210.0* 316.0*   Basic Metabolic Panel: Recent Labs  Lab 09/13/19 0314 09/14/19 0403 09/15/19 0503 09/16/19 0433  NA 140 143 139 140  K 3.3* 3.4* 3.1* 4.1  CL 104 105 104 105  CO2 28  30 27 27   GLUCOSE 120* 98 101* 102*  BUN 16 16 21* 22*  CREATININE 1.24 1.37* 1.49* 1.50*  CALCIUM 8.8* 8.7* 8.6* 8.9  MG  --  2.4  --  2.3   Liver Function Tests: No results for input(s): AST, ALT, ALKPHOS, BILITOT, PROT, ALBUMIN in the last 168 hours. No results for input(s): LIPASE, AMYLASE in the last 168 hours. No results for input(s): AMMONIA in the last 168 hours. CBC: Recent Labs  Lab 09/13/19 0314 09/16/19 0433  WBC 8.6 9.5  HGB 15.0 15.7  HCT 46.1 47.4  MCV 83.4 83.0  PLT 191 211   Cardiac Enzymes: No results for input(s): CKTOTAL, CKMB, CKMBINDEX, TROPONINI in the last 168 hours. BNP: Invalid input(s): POCBNP CBG: Recent Labs  Lab 09/13/19 2309  GLUCAP 92   D-Dimer No results for input(s): DDIMER in the last 72 hours. Hgb A1c No results for input(s): HGBA1C in the last 72 hours. Lipid Profile No results for input(s): CHOL, HDL, LDLCALC, TRIG, CHOLHDL, LDLDIRECT in the last 72 hours. Thyroid function studies No results for input(s): TSH, T4TOTAL, T3FREE, THYROIDAB in the last 72 hours.  Invalid input(s): FREET3 Anemia work up No results for input(s): VITAMINB12, FOLATE, FERRITIN, TIBC, IRON, RETICCTPCT in the last 72 hours. Urinalysis    Component Value Date/Time   COLORURINE YELLOW (A) 01/23/2017 1643   APPEARANCEUR CLEAR (A) 01/23/2017 1643   LABSPEC >1.046 (H) 01/23/2017 1643   PHURINE 7.0 01/23/2017 1643   GLUCOSEU NEGATIVE 01/23/2017 1643    HGBUR NEGATIVE 01/23/2017 1643   BILIRUBINUR NEGATIVE 01/23/2017 1643   KETONESUR NEGATIVE 01/23/2017 1643   PROTEINUR 30 (A) 01/23/2017 1643   NITRITE NEGATIVE 01/23/2017 1643   LEUKOCYTESUR NEGATIVE 01/23/2017 1643    Microbiology Recent Results (from the past 240 hour(s))  SARS CORONAVIRUS 2 (TAT 6-24 HRS) Nasopharyngeal Nasopharyngeal Swab     Status: None   Collection Time: 09/13/19  5:24 AM   Specimen: Nasopharyngeal Swab  Result Value Ref Range Status   SARS Coronavirus 2 NEGATIVE NEGATIVE Final    Comment: (NOTE) SARS-CoV-2 target nucleic acids are NOT DETECTED. The SARS-CoV-2 RNA is generally detectable in upper and lower respiratory specimens during the acute phase of infection. Negative results do not preclude SARS-CoV-2 infection, do not rule out co-infections with other pathogens, and should not be used as the sole basis for treatment or other patient management decisions. Negative results must be combined with clinical observations, patient history, and epidemiological information. The expected result is Negative. Fact Sheet for Patients: SugarRoll.be Fact Sheet for Healthcare Providers: https://www.woods-mathews.com/ This test is not yet approved or cleared by the [place] FDA and  has been authorized for detection and/or diagnosis of SARS-CoV-2 by FDA under an Emergency Use Authorization (EUA). This EUA will remain  in effect (meaning this test can be used) for the duration of the COVID-19 declaration under Section 56 4(b)(1) of the Act, 21 U.S.C. section 360bbb-3(b)(1), unless the authorization is terminated or revoked sooner. Performed at [facility], [place] [address], [place], [place] [address]     Time coordinating discharge: Approximately 40 minutes  Patrecia Pour, MD  [facility] 09/17/2019, 7:18 PM

## 2019-09-17 ENCOUNTER — Telehealth: Payer: Self-pay | Admitting: Family

## 2019-09-17 NOTE — Telephone Encounter (Signed)
Unable to reach or LVM on 4/8 and 4/9 with patient to follow up with him since his discharge from the hospital or to confirm his new patient CHF Clinic appointment that was scheduled with [place] for 4/14.   Deetta Perla, [place]

## 2019-09-23 NOTE — Progress Notes (Signed)
Patient ID: Benjamin Goodman., male    DOB: 1986/05/15, 34 y.o.   MRN: [id number]  HPI  [name] is a 34 y/o male with a history of HTN and chronic heart failure.  Echo report from 09/14/19 reviewed and showed an EF of 35-40% along with moderate LVH and trivial MR.   Admitted 09/13/19 due to acute on chronic HF and HTN. Cardiology consult obtained. Initially given IV lasix and then transitioned to oral diuretics. Elevated troponin thought to be due to demand ischemia.  Discharged after 3 days.   He presents today for his initial visit with a chief complaint of minimal shortness of breath upon moderate exertion. He describes this as chronic in nature having been present for several years. He has associated light-headedness, chest pain and difficulty sleeping along with this. He denies any abdominal distention, palpitations, pedal edema, cough, fatigue or weight gain.   Says that he saw cardiology yesterday and just had some medication changes. Checking his BP at home and it ranges from 150-170's/ upper 90's- 110's.   Past Medical History:  Diagnosis Date  . CHF (congestive heart failure) (HCC)   . Hypertension    Past Surgical History:  Procedure Laterality Date  . TONSILLECTOMY     History reviewed. No pertinent family history. Social History   Tobacco Use  . Smoking status: Never Smoker  . Smokeless tobacco: Never Used  Substance Use Topics  . Alcohol use: No   No Known Allergies Prior to Admission medications   Medication Sig Start Date End Date Taking? Authorizing Provider  acetaminophen (TYLENOL) 325 MG tablet Take 650 mg by mouth every 6 (six) hours as needed for mild pain or headache.   Yes [provider]  aspirin 325 MG tablet Take 325 mg by mouth in the morning and at bedtime.   Yes [provider]  furosemide (LASIX) 40 MG tablet Take 1 tablet (40 mg total) by mouth daily. 09/16/19 10/16/19 Yes Tyrone Nine, MD  hydrALAZINE (APRESOLINE) 50 MG tablet  Take 1 tablet (50 mg total) by mouth 3 (three) times daily. 09/16/19  Yes Tyrone Nine, MD  ibuprofen (ADVIL) 400 MG tablet Take 400 mg by mouth every 6 (six) hours as needed for headache.   Yes [provider]  isosorbide mononitrate (IMDUR) 30 MG 24 hr tablet Take 30 mg by mouth daily.   Yes [provider]  lisinopril (ZESTRIL) 20 MG tablet Take 1 tablet (20 mg total) by mouth daily. 09/16/19  Yes Tyrone Nine, MD  metoprolol tartrate (LOPRESSOR) 50 MG tablet Take 50 mg by mouth 2 (two) times daily.   Yes [provider]  Multiple Vitamins-Minerals (MULTIVITAMIN MEN PO) Take 1 tablet by mouth daily.   Yes [provider]  potassium chloride 20 MEQ TBCR Take 20 mEq by mouth 2 (two) times daily. 09/16/19  Yes Tyrone Nine, MD    Review of Systems  Constitutional: Negative for appetite change and fatigue.  HENT: Positive for nosebleeds. Negative for congestion, postnasal drip and sore throat.   Eyes: Negative.   Respiratory: Positive for shortness of breath (with moderate exertion). Negative for cough and chest tightness.   Cardiovascular: Positive for chest pain (when carrying items at work). Negative for palpitations and leg swelling.  Gastrointestinal: Negative for abdominal distention and abdominal pain.  Endocrine: Negative.   Genitourinary: Negative.   Musculoskeletal: Negative for back pain and neck pain.  Skin: Negative.   Allergic/Immunologic: Negative.  Neurological: Positive for light-headedness (yesterday). Negative for dizziness.  Hematological: Negative for adenopathy. Does not bruise/bleed easily.  Psychiatric/Behavioral: Positive for sleep disturbance (sleeping on 2 pillows). Negative for dysphoric mood. The patient is not nervous/anxious.     Vitals:   09/24/19 0908  BP: (!) 151/101  Pulse: (!) 105  Resp: 20  SpO2: 99%  Weight: 269 lb 8 oz (122.2 kg)  Height: 5\' 8"  (1.727 m)   Wt Readings from Last 3 Encounters:  09/24/19 269 lb 8  oz (122.2 kg)  09/16/19 265 lb 6.4 oz (120.4 kg)  01/27/17 271 lb 4.8 oz (123.1 kg)   Lab Results  Component Value Date   CREATININE 1.50 (H) 09/16/2019   CREATININE 1.49 (H) 09/15/2019   CREATININE 1.37 (H) 09/14/2019     Physical Exam Vitals and nursing note reviewed.  Constitutional:      Appearance: Normal appearance.  HENT:     Head: Normocephalic and atraumatic.  Cardiovascular:     Rate and Rhythm: Regular rhythm. Tachycardia present.  Pulmonary:     Effort: Pulmonary effort is normal. No respiratory distress.     Breath sounds: No wheezing or rales.  Abdominal:     General: There is no distension.     Palpations: Abdomen is soft.  Musculoskeletal:        General: No tenderness.     Cervical back: Normal range of motion and neck supple.     Right lower leg: No edema.     Left lower leg: No edema.  Skin:    General: Skin is warm and dry.  Neurological:     General: No focal deficit present.     Mental Status: He is alert and oriented to person, place, and time.  Psychiatric:        Mood and Affect: Mood normal.        Behavior: Behavior normal.        Thought Content: Thought content normal.     Assessment & Plan:  1: Chronic heart failure with reduced ejection fraction- - NYHA class II - euvolemic today - weighing daily and says that his weight has been stable; instructed to call for an overnight weight gain of >2 pounds or a weekly weight gain of >5 pounds - not adding salt and has recently started reading food labels for sodium content; reviewed the importance of closely following a 2000mg  sodium diet and written dietary information was given to him about this - saw cardiology ([name]) 09/23/19 - quite active at work - consider changing his lisinopril to entresto, metoprolol tartrate to succinate and maybe add farxiga/ spironolactone at future visits - patient just had medication changes yesterday so will re-address at his next visit - BNP 09/15/19 was  316.0 - PharmD reconciled medications w/the patient  2: HTN- - BP elevated; medications were changed yesterday at cardiology office - patient instructed to check his BP once daily but vary the times of day that he checks it - BMP 09/16/19 reviewed and showed sodium 140, potassium 4.1, creatinine 1.5 and GFR >60   Medication list was reviewed.   Return in 1 month or sooner for any questions/problems before then.

## 2019-09-24 ENCOUNTER — Encounter: Payer: Self-pay | Admitting: Family

## 2019-09-24 ENCOUNTER — Other Ambulatory Visit: Payer: Self-pay

## 2019-09-24 ENCOUNTER — Ambulatory Visit: Payer: BC Managed Care – PPO | Attending: Family | Admitting: Family

## 2019-09-24 VITALS — BP 151/101 | HR 105 | Resp 20 | Ht 68.0 in | Wt 269.5 lb

## 2019-09-24 DIAGNOSIS — Z7982 Long term (current) use of aspirin: Secondary | ICD-10-CM | POA: Diagnosis not present

## 2019-09-24 DIAGNOSIS — I5022 Chronic systolic (congestive) heart failure: Secondary | ICD-10-CM | POA: Diagnosis not present

## 2019-09-24 DIAGNOSIS — I110 Hypertensive heart disease with heart failure: Secondary | ICD-10-CM | POA: Diagnosis not present

## 2019-09-24 DIAGNOSIS — I10 Essential (primary) hypertension: Secondary | ICD-10-CM

## 2019-09-24 DIAGNOSIS — Z79899 Other long term (current) drug therapy: Secondary | ICD-10-CM | POA: Insufficient documentation

## 2019-09-24 NOTE — Patient Instructions (Signed)
Continue weighing daily and call for an overnight weight gain of > 2 pounds or a weekly weight gain of >5 pounds. 

## 2019-10-14 DIAGNOSIS — I429 Cardiomyopathy, unspecified: Secondary | ICD-10-CM | POA: Insufficient documentation

## 2019-10-22 NOTE — Progress Notes (Signed)
Patient ID: Benjamin Goodman., male    DOB: 1985-08-25, 34 y.o.   MRN: [id number]  HPI  Benjamin Goodman is a 34 y/o male with a history of HTN and chronic heart failure.  Echo report from 09/14/19 reviewed and showed an EF of 35-40% along with moderate LVH and trivial Benjamin.   Admitted 09/13/19 due to acute on chronic HF and HTN. Cardiology consult obtained. Initially given IV lasix and then transitioned to oral diuretics. Elevated troponin thought to be due to demand ischemia.  Discharged after 3 days.   He presents today for a follow-up visit with a chief complaint of moderate fatigue with little exertion. He describes this as chronic having been present for several months. He says that he wakes up just as tired as when he went to bed. He has associated chest pain (with exertion), light-headedness, nosebleeds, difficulty sleeping, snoring and waking himself up at night (per partner's report). He denies any abdominal distention, palpitations, pedal edema, cough or weight gain.   Walks on the treadmill 20 minutes every day but has to then stop because he has chest pain. Scheduled for stress test early June 2021.   Past Medical History:  Diagnosis Date  . CHF (congestive heart failure) (HCC)   . Hypertension    Past Surgical History:  Procedure Laterality Date  . TONSILLECTOMY     No family history on file. Social History   Tobacco Use  . Smoking status: Never Smoker  . Smokeless tobacco: Never Used  Substance Use Topics  . Alcohol use: No   No Known Allergies  Prior to Admission medications   Medication Sig Start Date End Date Taking? Authorizing Provider  acetaminophen (TYLENOL) 325 MG tablet Take 650 mg by mouth every 6 (six) hours as needed for mild pain or headache.    [provider]  aspirin 325 MG tablet Take 325 mg by mouth in the morning and at bedtime.    [provider]  furosemide (LASIX) 40 MG tablet Take 1 tablet (40 mg total) by mouth daily. 09/16/19 10/16/19   Tyrone Nine, MD  hydrALAZINE (APRESOLINE) 50 MG tablet Take 1 tablet (50 mg total) by mouth 3 (three) times daily. 09/16/19   Tyrone Nine, MD  ibuprofen (ADVIL) 400 MG tablet Take 400 mg by mouth every 6 (six) hours as needed for headache.    [provider]  isosorbide mononitrate (IMDUR) 30 MG 24 hr tablet Take 30 mg by mouth daily.    [provider]  lisinopril (ZESTRIL) 20 MG tablet Take 1 tablet (20 mg total) by mouth daily. 09/16/19   Tyrone Nine, MD  metoprolol tartrate (LOPRESSOR) 50 MG tablet Take 50 mg by mouth 2 (two) times daily.    [provider]  Multiple Vitamins-Minerals (MULTIVITAMIN MEN PO) Take 1 tablet by mouth daily.    [provider]  potassium chloride 20 MEQ TBCR Take 20 mEq by mouth 2 (two) times daily. 09/16/19   Tyrone Nine, MD    Review of Systems  Constitutional: Positive for fatigue (easily). Negative for appetite change.  HENT: Positive for nosebleeds. Negative for congestion, postnasal drip and sore throat.   Eyes: Negative.   Respiratory: Positive for shortness of breath (with moderate exertion). Negative for cough and chest tightness.   Cardiovascular: Positive for chest pain (with exertion). Negative for palpitations and leg swelling.  Gastrointestinal: Negative for abdominal distention and abdominal pain.  Endocrine: Negative.   Genitourinary: Negative.  Musculoskeletal: Negative for back pain and neck pain.  Skin: Negative.   Allergic/Immunologic: Negative.   Neurological: Positive for light-headedness (at times). Negative for dizziness.  Hematological: Negative for adenopathy. Does not bruise/bleed easily.  Psychiatric/Behavioral: Positive for sleep disturbance (sleeping on 2 pillows). Negative for dysphoric mood. The patient is not nervous/anxious.    Vitals:   10/25/19 0938 10/25/19 0952  BP: (!) 180/100 (!) 170/98  Pulse: 77   Resp: 20   SpO2: 98%   Weight: 266 lb 6 oz (120.8 kg)   Height: 5\' 8"   (1.727 m)    Wt Readings from Last 3 Encounters:  10/25/19 266 lb 6 oz (120.8 kg)  09/24/19 269 lb 8 oz (122.2 kg)  09/16/19 265 lb 6.4 oz (120.4 kg)   Lab Results  Component Value Date   CREATININE 1.50 (H) 09/16/2019   CREATININE 1.49 (H) 09/15/2019   CREATININE 1.37 (H) 09/14/2019    Physical Exam Vitals and nursing note reviewed.  Constitutional:      Appearance: Normal appearance.  HENT:     Head: Normocephalic and atraumatic.  Cardiovascular:     Rate and Rhythm: Normal rate and regular rhythm.  Pulmonary:     Effort: Pulmonary effort is normal. No respiratory distress.     Breath sounds: No wheezing or rales.  Abdominal:     General: There is no distension.     Palpations: Abdomen is soft.  Musculoskeletal:        General: No tenderness.     Cervical back: Normal range of motion and neck supple.     Right lower leg: No edema.     Left lower leg: No edema.  Skin:    General: Skin is warm and dry.  Neurological:     General: No focal deficit present.     Mental Status: He is alert and oriented to person, place, and time.  Psychiatric:        Mood and Affect: Mood normal.        Behavior: Behavior normal.        Thought Content: Thought content normal.     Assessment & Plan:  1: Chronic heart failure with reduced ejection fraction- - NYHA class III - euvolemic today - weighing daily and says that his weight has declined; reminded to call for an overnight weight gain of >2 pounds or a weekly weight gain of >5 pounds - weight down 3 pounds from last visit here 1 month ago; walking on treadmill daily for ~ 20 minutes  - not adding salt and has been reading food labels for sodium content; reviewed the importance of closely following a 2000mg  sodium diet  - saw cardiology (Benjamin Goodman) 10/14/19 & stress test scheduled early June - quite active at work - will stop his lisinopril (already took this morning); instructed him to not take anymore lisinopril and beginning on  5/19, he will start entresto 24/26mg  as 1 tablet BID; 30 day voucher given to him along with the $10 co-pay card as he has commercial insurance - will check BMP at his next visit - plan to titrate entresto, possibly change his metoprolol to succinate version, possibly add spirononlactone/ farxiga at future visits - BNP 09/15/19 was 316.0  2: HTN- - BP initially elevated (180/100) and was still elevated after recheck with manual cuff (171/98) - changing medications per above; hope to titrate entresto and possibly stop amlodipine  - BMP 09/16/19 reviewed and showed sodium 140, potassium 4.1, creatinine 1.5 and GFR >60  3: Snoring- - patient says that he wakes up just as tired as when he went to bed - has been told that he snores and that he has woken himself up snorting/ gasping - does work evening shift  - will send in sleep study referral to rule out sleep apnea   Medication bottles were reviewed.   Return in 1 month or sooner for any questions/problems before then.

## 2019-10-25 ENCOUNTER — Encounter: Payer: Self-pay | Admitting: Family

## 2019-10-25 ENCOUNTER — Ambulatory Visit: Payer: BC Managed Care – PPO | Attending: Family | Admitting: Family

## 2019-10-25 ENCOUNTER — Other Ambulatory Visit: Payer: Self-pay

## 2019-10-25 VITALS — BP 170/98 | HR 77 | Resp 20 | Ht 68.0 in | Wt 266.4 lb

## 2019-10-25 DIAGNOSIS — R0683 Snoring: Secondary | ICD-10-CM | POA: Insufficient documentation

## 2019-10-25 DIAGNOSIS — Z7982 Long term (current) use of aspirin: Secondary | ICD-10-CM | POA: Diagnosis not present

## 2019-10-25 DIAGNOSIS — I5022 Chronic systolic (congestive) heart failure: Secondary | ICD-10-CM | POA: Diagnosis not present

## 2019-10-25 DIAGNOSIS — I509 Heart failure, unspecified: Secondary | ICD-10-CM | POA: Diagnosis present

## 2019-10-25 DIAGNOSIS — Z79899 Other long term (current) drug therapy: Secondary | ICD-10-CM | POA: Insufficient documentation

## 2019-10-25 DIAGNOSIS — I10 Essential (primary) hypertension: Secondary | ICD-10-CM

## 2019-10-25 DIAGNOSIS — I110 Hypertensive heart disease with heart failure: Secondary | ICD-10-CM | POA: Diagnosis not present

## 2019-10-25 MED ORDER — SACUBITRIL-VALSARTAN 24-26 MG PO TABS: 1.0000 | ORAL_TABLET | Freq: Two times a day (BID) | ORAL | 3 refills | Status: DC

## 2019-10-25 NOTE — Patient Instructions (Addendum)
Continue weighing daily and call for an overnight weight gain of > 2 pounds or a weekly weight gain of >5 pounds.   Stop taking lisinopril.  Beginning Wednesday morning, you will start entresto and take 1 tablet in the morning and 1 tablet in the evening.

## 2019-11-19 ENCOUNTER — Ambulatory Visit: Payer: BC Managed Care – PPO | Attending: Internal Medicine

## 2019-11-19 DIAGNOSIS — E669 Obesity, unspecified: Secondary | ICD-10-CM | POA: Insufficient documentation

## 2019-11-19 DIAGNOSIS — R0683 Snoring: Secondary | ICD-10-CM | POA: Diagnosis not present

## 2019-11-19 DIAGNOSIS — R0681 Apnea, not elsewhere classified: Secondary | ICD-10-CM | POA: Insufficient documentation

## 2019-11-19 DIAGNOSIS — I10 Essential (primary) hypertension: Secondary | ICD-10-CM | POA: Diagnosis not present

## 2019-11-19 NOTE — Progress Notes (Signed)
Patient ID: Benjamin Lenker., male    DOB: 1985-08-19, 34 y.o.   MRN: [id number]  HPI  Benjamin Goodman is a 34 y/o male with a history of HTN and chronic heart failure.  Echo report from 09/14/19 reviewed and showed an EF of 35-40% along with moderate LVH and trivial Benjamin.   Admitted 09/13/19 due to acute on chronic HF and HTN. Cardiology consult obtained. Initially given IV lasix and then transitioned to oral diuretics. Elevated troponin thought to be due to demand ischemia.  Discharged after 3 days.   He presents today for a follow-up visit with a chief complaint of moderate fatigue upon minimal exertion. He describes this as chronic in nature having been present for several years. He has associated chronic chest pain, headaches, light-headedness and difficulty sleeping along with this. He denies any abdominal distention, palpitations, pedal edema, shortness of breath, cough or weight gain.   Recently had stress test as well as sleep study done.   Past Medical History:  Diagnosis Date   CHF (congestive heart failure) (HCC)    Hypertension    Past Surgical History:  Procedure Laterality Date   TONSILLECTOMY     No family history on file. Social History   Tobacco Use   Smoking status: Never Smoker   Smokeless tobacco: Never Used  Substance Use Topics   Alcohol use: No   No Known Allergies  Prior to Admission medications   Medication Sig Start Date End Date Taking? Authorizing Provider  acetaminophen (TYLENOL) 325 MG tablet Take 650 mg by mouth every 6 (six) hours as needed for mild pain or headache.   Yes [provider]  amLODipine (NORVASC) 10 MG tablet Take 10 mg by mouth daily.    Yes [provider]  aspirin 325 MG tablet Take 325 mg by mouth daily.    Yes [provider]  furosemide (LASIX) 40 MG tablet Take 1 tablet (40 mg total) by mouth daily. 09/16/19 11/22/19 Yes Tyrone Nine, MD  hydrALAZINE (APRESOLINE) 50 MG tablet Take 1 tablet (50 mg  total) by mouth 3 (three) times daily. 09/16/19  Yes Tyrone Nine, MD  ibuprofen (ADVIL) 400 MG tablet Take 400 mg by mouth every 6 (six) hours as needed for headache.   Yes [provider]  isosorbide mononitrate (IMDUR) 30 MG 24 hr tablet Take 30 mg by mouth daily.   Yes [provider]  metoprolol tartrate (LOPRESSOR) 50 MG tablet Take 50 mg by mouth 2 (two) times daily.   Yes [provider]  Multiple Vitamins-Minerals (MULTIVITAMIN MEN PO) Take 1 tablet by mouth daily.   Yes [provider]  potassium chloride 20 MEQ TBCR Take 20 mEq by mouth 2 (two) times daily. 09/16/19  Yes Tyrone Nine, MD  sacubitril-valsartan (ENTRESTO) 24-26 MG Take 1 tablet by mouth 2 (two) times daily. 10/25/19  Yes Delma Freeze, FNP     Review of Systems  Constitutional: Positive for fatigue (easily). Negative for appetite change.  HENT: Positive for nosebleeds. Negative for congestion, postnasal drip and sore throat.   Eyes: Negative.   Respiratory: Negative for cough, chest tightness and shortness of breath.   Cardiovascular: Positive for chest pain ("constant"). Negative for palpitations and leg swelling.  Gastrointestinal: Negative for abdominal distention and abdominal pain.  Endocrine: Negative.   Genitourinary: Negative.   Musculoskeletal: Negative for back pain and neck pain.  Skin: Negative.   Allergic/Immunologic: Negative.   Neurological: Positive for light-headedness (at  times) and headaches. Negative for dizziness.  Hematological: Negative for adenopathy. Does not bruise/bleed easily.  Psychiatric/Behavioral: Positive for sleep disturbance (sleeping on 2 pillows). Negative for dysphoric mood. The patient is not nervous/anxious.    Vitals:   11/22/19 0929  BP: (!) 153/94  Pulse: 73  Resp: 20  SpO2: 98%  Weight: 264 lb 8 oz (120 kg)  Height: 5\' 8"  (1.727 m)   Wt Readings from Last 3 Encounters:  11/22/19 264 lb 8 oz (120 kg)  10/25/19 266 lb 6 oz  (120.8 kg)  09/24/19 269 lb 8 oz (122.2 kg)   Lab Results  Component Value Date   CREATININE 1.50 (H) 09/16/2019   CREATININE 1.49 (H) 09/15/2019   CREATININE 1.37 (H) 09/14/2019     Physical Exam Vitals and nursing note reviewed.  Constitutional:      Appearance: Normal appearance.  HENT:     Head: Normocephalic and atraumatic.  Cardiovascular:     Rate and Rhythm: Normal rate and regular rhythm.  Pulmonary:     Effort: Pulmonary effort is normal. No respiratory distress.     Breath sounds: No wheezing or rales.  Abdominal:     General: There is no distension.     Palpations: Abdomen is soft.  Musculoskeletal:        General: No tenderness.     Cervical back: Normal range of motion and neck supple.     Right lower leg: No edema.     Left lower leg: No edema.  Skin:    General: Skin is warm and dry.  Neurological:     General: No focal deficit present.     Mental Status: He is alert and oriented to person, place, and time.  Psychiatric:        Mood and Affect: Mood normal.        Behavior: Behavior normal.        Thought Content: Thought content normal.     Assessment & Plan:  1: Chronic heart failure with reduced ejection fraction- - NYHA class III - euvolemic today - weighing daily; reminded to call for an overnight weight gain of >2 pounds or a weekly weight gain of >5 pounds - weight down 2 pounds from last visit here 1 month ago - walking on treadmill daily for ~ 20 minutes  - not adding salt and has been reading food labels for sodium content; reviewed the importance of closely following a 2000mg  sodium diet  - saw cardiology ([name]) 11/19/19; stress test done 11/16/19 - quite active at work - [employer] 24/26mg  started at last visit - will check BMP today & increase dose if lab results are ok - plan to change his metoprolol to succinate version, possibly add spirononlactone/ farxiga at future visits - BNP 09/15/19 was 316.0  2: HTN- - BP mildly elevated  today; amlodipine has been increased by cardiology last week - advised to get PCP appointment scheduled - BMP 09/16/19 reviewed and showed sodium 140, potassium 4.1, creatinine 1.5 and GFR >60  3: Snoring- - patient says that he wakes up just as tired as when he went to bed - has been told that he snores and that he has woken himself up snorting/ gasping - does work evening shift  - sleep study completed 11/19/19   Patient did not bring his medications nor a list. Each medication was verbally reviewed with the patient and he was encouraged to bring the bottles to every visit to confirm accuracy of list.  Return in 6 weeks or sooner for any questions/problems before then.

## 2019-11-20 ENCOUNTER — Other Ambulatory Visit: Payer: Self-pay

## 2019-11-22 ENCOUNTER — Telehealth: Payer: Self-pay | Admitting: Family

## 2019-11-22 ENCOUNTER — Ambulatory Visit: Payer: BC Managed Care – PPO | Attending: Family | Admitting: Family

## 2019-11-22 ENCOUNTER — Other Ambulatory Visit: Payer: Self-pay

## 2019-11-22 ENCOUNTER — Encounter: Payer: Self-pay | Admitting: Family

## 2019-11-22 VITALS — BP 153/94 | HR 73 | Resp 20 | Ht 68.0 in | Wt 264.5 lb

## 2019-11-22 DIAGNOSIS — Z7901 Long term (current) use of anticoagulants: Secondary | ICD-10-CM | POA: Insufficient documentation

## 2019-11-22 DIAGNOSIS — Z7982 Long term (current) use of aspirin: Secondary | ICD-10-CM | POA: Insufficient documentation

## 2019-11-22 DIAGNOSIS — R42 Dizziness and giddiness: Secondary | ICD-10-CM | POA: Diagnosis not present

## 2019-11-22 DIAGNOSIS — R0683 Snoring: Secondary | ICD-10-CM | POA: Diagnosis not present

## 2019-11-22 DIAGNOSIS — G8929 Other chronic pain: Secondary | ICD-10-CM | POA: Insufficient documentation

## 2019-11-22 DIAGNOSIS — R519 Headache, unspecified: Secondary | ICD-10-CM | POA: Insufficient documentation

## 2019-11-22 DIAGNOSIS — I5022 Chronic systolic (congestive) heart failure: Secondary | ICD-10-CM | POA: Insufficient documentation

## 2019-11-22 DIAGNOSIS — R079 Chest pain, unspecified: Secondary | ICD-10-CM | POA: Diagnosis not present

## 2019-11-22 DIAGNOSIS — I10 Essential (primary) hypertension: Secondary | ICD-10-CM

## 2019-11-22 DIAGNOSIS — R5383 Other fatigue: Secondary | ICD-10-CM | POA: Insufficient documentation

## 2019-11-22 DIAGNOSIS — G479 Sleep disorder, unspecified: Secondary | ICD-10-CM | POA: Insufficient documentation

## 2019-11-22 DIAGNOSIS — Z79899 Other long term (current) drug therapy: Secondary | ICD-10-CM | POA: Diagnosis not present

## 2019-11-22 DIAGNOSIS — I110 Hypertensive heart disease with heart failure: Secondary | ICD-10-CM | POA: Insufficient documentation

## 2019-11-22 LAB — BASIC METABOLIC PANEL
Anion gap: 7 (ref 5–15)
BUN: 12 mg/dL (ref 6–20)
CO2: 27 mmol/L (ref 22–32)
Calcium: 9 mg/dL (ref 8.9–10.3)
Chloride: 107 mmol/L (ref 98–111)
Creatinine, Ser: 1.23 mg/dL (ref 0.61–1.24)
GFR calc Af Amer: >60 mL/min (ref >60)
GFR calc non Af Amer: >60 mL/min (ref >60)
Glucose, Bld: 104 mg/dL — ABNORMAL HIGH (ref 70–99)
Potassium: 3.8 mmol/L (ref 3.5–5.1)
Sodium: 141 mmol/L (ref 135–145)

## 2019-11-22 MED ORDER — SACUBITRIL-VALSARTAN 49-51 MG PO TABS: 1.0000 | ORAL_TABLET | Freq: Two times a day (BID) | ORAL | 5 refills | Status: DC

## 2019-11-22 NOTE — Telephone Encounter (Signed)
Spoke with patient regarding lab results obtained earlier today. Potassium and renal function are normal.  Advised patient to increase his entresto to 49/51mg  BID. He can finish his current bottle by taking 2 tablets BID until gone and then when he picks up the new RX, he'll go back to taking 1 tablet BID. Patient verbalized understanding.   Will recheck BMP at next visit if not done elsewhere.

## 2019-11-22 NOTE — Patient Instructions (Signed)
Continue weighing daily and call for an overnight weight gain of > 2 pounds or a weekly weight gain of >5 pounds. 

## 2019-12-21 NOTE — Progress Notes (Signed)
Patient ID: Benjamin Goodman., male    DOB: [birth year], 34 y.o.   MRN: [id number]  HPI  Mr Begley is a 34 y/o male with a history of HTN and chronic heart failure.  Echo report from 09/14/19 reviewed and showed an EF of 35-40% along with moderate LVH and trivial MR.   Admitted 09/13/19 due to acute on chronic HF and HTN. Cardiology consult obtained. Initially given IV lasix and then transitioned to oral diuretics. Elevated troponin thought to be due to demand ischemia.  Discharged after 3 days.   He presents today for a follow-up visit with a chief complaint of moderate fatigue upon minimal exertion. He describes this as chronic in nature having been present for several years. He has associated chest tightness (chronic), light-headedness, fluctuating weight and difficulty sleeping along with this. He denies any abdominal distention, palpitations, pedal edema, shortness of breath or cough.   Says that he checks his BP daily at home or at work and it's gotten down to 121/ 82 and that was when he felt dizzy. Entresto was titrated up at last visit to 49/51mg  BID.  Has had sleep study done in June but hasn't heard anything regarding results.   Past Medical History:  Diagnosis Date  . CHF (congestive heart failure) (HCC)   . Hypertension    Past Surgical History:  Procedure Laterality Date  . TONSILLECTOMY     No family history on file. Social History   Tobacco Use  . Smoking status: Never Smoker  . Smokeless tobacco: Never Used  Substance Use Topics  . Alcohol use: No   No Known Allergies  Prior to Admission medications   Medication Sig Start Date End Date Taking? Authorizing Provider  acetaminophen (TYLENOL) 325 MG tablet Take 650 mg by mouth every 6 (six) hours as needed for mild pain or headache.   Yes [provider]  amLODipine (NORVASC) 10 MG tablet Take 10 mg by mouth daily.    Yes [provider]  aspirin 325 MG tablet Take 325 mg by mouth daily.    Yes  [provider]  furosemide (LASIX) 40 MG tablet Take 1 tablet (40 mg total) by mouth daily. 09/16/19 01/03/20 Yes Tyrone Nine, MD  hydrALAZINE (APRESOLINE) 50 MG tablet Take 1 tablet (50 mg total) by mouth 3 (three) times daily. 09/16/19  Yes Tyrone Nine, MD  isosorbide mononitrate (IMDUR) 30 MG 24 hr tablet Take 30 mg by mouth daily.   Yes [provider]  metoprolol tartrate (LOPRESSOR) 50 MG tablet Take 100 mg by mouth 2 (two) times daily.    Yes [provider]  Multiple Vitamins-Minerals (MULTIVITAMIN MEN PO) Take 1 tablet by mouth daily.   Yes [provider]  potassium chloride 20 MEQ TBCR Take 20 mEq by mouth 2 (two) times daily. 09/16/19  Yes Tyrone Nine, MD  sacubitril-valsartan (ENTRESTO) 49-51 MG Take 1 tablet by mouth 2 (two) times daily. 11/22/19  Yes Delma Freeze, FNP    Review of Systems  Constitutional: Positive for fatigue (easily). Negative for appetite change.  HENT: Positive for nosebleeds. Negative for congestion, postnasal drip and sore throat.   Eyes: Negative.   Respiratory: Positive for chest tightness. Negative for cough and shortness of breath.   Cardiovascular: Negative for chest pain, palpitations and leg swelling.  Gastrointestinal: Negative for abdominal distention and abdominal pain.  Endocrine: Negative.   Genitourinary: Negative.   Musculoskeletal: Negative for back pain and neck pain.  Skin: Negative.   Allergic/Immunologic: Negative.   Neurological: Positive for light-headedness (at times) and headaches. Negative for dizziness.  Hematological: Negative for adenopathy. Does not bruise/bleed easily.  Psychiatric/Behavioral: Positive for sleep disturbance (sleeping on 2 pillows). Negative for dysphoric mood. The patient is not nervous/anxious.    Vitals:   01/03/20 0900  BP: (!) 133/79  Pulse: 68  Resp: 20  SpO2: 98%  Weight: (!) 262 lb (118.8 kg)  Height: 5\' 8"  (1.727 m)   Wt Readings from Last 3 Encounters:   01/03/20 (!) 262 lb (118.8 kg)  11/22/19 264 lb 8 oz (120 kg)  10/25/19 266 lb 6 oz (120.8 kg)   Lab Results  Component Value Date   CREATININE 1.23 11/22/2019   CREATININE 1.50 (H) 09/16/2019   CREATININE 1.49 (H) 09/15/2019   Physical Exam Vitals and nursing note reviewed.  Constitutional:      Appearance: Normal appearance.  HENT:     Head: Normocephalic and atraumatic.  Cardiovascular:     Rate and Rhythm: Normal rate and regular rhythm.  Pulmonary:     Effort: Pulmonary effort is normal. No respiratory distress.     Breath sounds: No wheezing or rales.  Abdominal:     General: There is no distension.     Palpations: Abdomen is soft.  Musculoskeletal:        General: No tenderness.     Cervical back: Normal range of motion and neck supple.     Right lower leg: No edema.     Left lower leg: No edema.  Skin:    General: Skin is warm and dry.  Neurological:     General: No focal deficit present.     Mental Status: He is alert and oriented to person, place, and time.  Psychiatric:        Mood and Affect: Mood normal.        Behavior: Behavior normal.        Thought Content: Thought content normal.     Assessment & Plan:  1: Chronic heart failure with reduced ejection fraction- - NYHA class III - euvolemic today - weighing daily; reminded to call for an overnight weight gain of >2 pounds or a weekly weight gain of >5 pounds - weight down 2 pounds from last visit here 6 weeks ago - walking on treadmill daily for ~ 20 minutes  - not adding salt and has been reading food labels for sodium content; reviewed the importance of closely following a 2000mg  sodium diet  - saw cardiology ([name]) 12/09/19 - quite active at work - entresto titrated at last visit; will get BMP today - will change his metoprolol to succinate version once he finishes his current full bottle of tartrate; patient to call before running out so new RX for metoprolol succinate 200mg  daily can be  sent in - BNP 09/15/19 was 316.0  2: HTN- - BP looks good today; if he continues to experience dizziness, he's to call back and would stop his amlodipine with possibly titrating up entresto  - advised, again, to get PCP appointment scheduled - BMP 11/22/19 reviewed and showed sodium 141, potassium 3.8, creatinine 1.23 and GFR >60  3: Snoring- - patient says that he wakes up just as tired as when he went to bed - has been told that he snores and that he has woken himself up snorting/ gasping - does work evening shift  - sleep study completed 11/19/19 but he hasn't heard from them; will have NT  call and see if results can get faxed to [place]   Medication bottles reviewed.   Return in 1 month or sooner for any questions/problems before then.

## 2020-01-03 ENCOUNTER — Encounter: Payer: Self-pay | Admitting: Family

## 2020-01-03 ENCOUNTER — Ambulatory Visit: Payer: BC Managed Care – PPO | Attending: Family | Admitting: Family

## 2020-01-03 ENCOUNTER — Other Ambulatory Visit: Payer: Self-pay

## 2020-01-03 VITALS — BP 133/79 | HR 68 | Resp 20 | Ht 68.0 in | Wt 262.0 lb

## 2020-01-03 DIAGNOSIS — Z79899 Other long term (current) drug therapy: Secondary | ICD-10-CM | POA: Insufficient documentation

## 2020-01-03 DIAGNOSIS — I110 Hypertensive heart disease with heart failure: Secondary | ICD-10-CM | POA: Insufficient documentation

## 2020-01-03 DIAGNOSIS — R0683 Snoring: Secondary | ICD-10-CM | POA: Diagnosis not present

## 2020-01-03 DIAGNOSIS — I5022 Chronic systolic (congestive) heart failure: Secondary | ICD-10-CM | POA: Diagnosis present

## 2020-01-03 DIAGNOSIS — R5383 Other fatigue: Secondary | ICD-10-CM | POA: Insufficient documentation

## 2020-01-03 DIAGNOSIS — Z7982 Long term (current) use of aspirin: Secondary | ICD-10-CM | POA: Diagnosis not present

## 2020-01-03 DIAGNOSIS — R0789 Other chest pain: Secondary | ICD-10-CM | POA: Diagnosis not present

## 2020-01-03 DIAGNOSIS — I10 Essential (primary) hypertension: Secondary | ICD-10-CM

## 2020-01-03 LAB — BASIC METABOLIC PANEL
Anion gap: 8 (ref 5–15)
BUN: 16 mg/dL (ref 6–20)
CO2: 28 mmol/L (ref 22–32)
Calcium: 9 mg/dL (ref 8.9–10.3)
Chloride: 104 mmol/L (ref 98–111)
Creatinine, Ser: 1.21 mg/dL (ref 0.61–1.24)
GFR calc Af Amer: >60 mL/min (ref >60)
GFR calc non Af Amer: >60 mL/min (ref >60)
Glucose, Bld: 100 mg/dL — ABNORMAL HIGH (ref 70–99)
Potassium: 3.8 mmol/L (ref 3.5–5.1)
Sodium: 140 mmol/L (ref 135–145)

## 2020-01-03 NOTE — Patient Instructions (Addendum)
Continue weighing daily and call for an overnight weight gain of > 2 pounds or a weekly weight gain of >5 pounds.  Call me when you get close to running out of metoprolol so I can switch you to the once daily version.   If dizziness continues, call me back and we may stop your amlodipine.

## 2020-01-31 ENCOUNTER — Other Ambulatory Visit: Payer: Self-pay | Admitting: Family

## 2020-02-03 ENCOUNTER — Ambulatory Visit: Payer: BC Managed Care – PPO | Attending: Family | Admitting: Family

## 2020-02-03 ENCOUNTER — Encounter: Payer: Self-pay | Admitting: Family

## 2020-02-03 ENCOUNTER — Other Ambulatory Visit: Payer: Self-pay

## 2020-02-03 VITALS — BP 149/86 | HR 72 | Resp 18 | Ht 66.0 in | Wt 265.4 lb

## 2020-02-03 DIAGNOSIS — I5022 Chronic systolic (congestive) heart failure: Secondary | ICD-10-CM

## 2020-02-03 DIAGNOSIS — I110 Hypertensive heart disease with heart failure: Secondary | ICD-10-CM | POA: Diagnosis not present

## 2020-02-03 DIAGNOSIS — R5383 Other fatigue: Secondary | ICD-10-CM | POA: Insufficient documentation

## 2020-02-03 DIAGNOSIS — R0789 Other chest pain: Secondary | ICD-10-CM | POA: Insufficient documentation

## 2020-02-03 DIAGNOSIS — Z7982 Long term (current) use of aspirin: Secondary | ICD-10-CM | POA: Insufficient documentation

## 2020-02-03 DIAGNOSIS — Z79899 Other long term (current) drug therapy: Secondary | ICD-10-CM | POA: Insufficient documentation

## 2020-02-03 DIAGNOSIS — R0683 Snoring: Secondary | ICD-10-CM | POA: Insufficient documentation

## 2020-02-03 DIAGNOSIS — I10 Essential (primary) hypertension: Secondary | ICD-10-CM

## 2020-02-03 NOTE — Progress Notes (Signed)
Patient ID: Benjamin Malena., male    DOB: 27-Dec-1985, 34 y.o.   MRN: [id number]  HPI  Benjamin Goodman is a 34 y/o male with a history of HTN and chronic heart failure.  Echo report from 09/14/19 reviewed and showed an EF of 35-40% along with moderate LVH and trivial Benjamin.   Admitted 09/13/19 due to acute on chronic HF and HTN. Cardiology consult obtained. Initially given IV lasix and then transitioned to oral diuretics. Elevated troponin thought to be due to demand ischemia.  Discharged after 3 days.   Benjamin Goodman presents today for a follow-up visit with a chief complaint of minimal shortness of breath upon moderate exertion. Benjamin Goodman says that this has been chronic in nature having been present for several years although does feel like it's improving. Benjamin Goodman has associated fatigue, chest tightness, intermittent chest pain, light-headedness and difficulty sleeping along with this. Benjamin Goodman denies any abdominal distention, palpitations, pedal edema, cough or weight gain.   Benjamin Goodman checks his BP at home and says that it will drop to 120/50 and then Benjamin Goodman'll get dizzy. Benjamin Goodman says that on a couple of occasions when his BP has gotten low, Benjamin Goodman doesn't know if Benjamin Goodman's passed out or just fallen asleep because Benjamin Goodman's not sleeping well.   Still hasn't heard about this sleep study results from June 2021.   Past Medical History:  Diagnosis Date   CHF (congestive heart failure) (HCC)    Hypertension    Past Surgical History:  Procedure Laterality Date   TONSILLECTOMY     No family history on file. Social History   Tobacco Use   Smoking status: Never Smoker   Smokeless tobacco: Never Used  Substance Use Topics   Alcohol use: No   No Known Allergies  Prior to Admission medications   Medication Sig Start Date End Date Taking? Authorizing Provider  acetaminophen (TYLENOL) 325 MG tablet Take 650 mg by mouth every 6 (six) hours as needed for mild pain or headache.   Yes [provider]  amLODipine (NORVASC) 10 MG tablet Take 10  mg by mouth daily.    Yes [provider]  aspirin 325 MG tablet Take 325 mg by mouth daily.    Yes [provider]  ENTRESTO 49-51 MG TAKE 1 TABLET BY MOUTH 2 (TWO) TIMES DAILY. STOP THE 24/26 DOSE 01/31/20  Yes Clarisa Kindred A, FNP  furosemide (LASIX) 40 MG tablet Take 1 tablet (40 mg total) by mouth daily. 09/16/19 02/03/20 Yes Tyrone Nine, MD  hydrALAZINE (APRESOLINE) 50 MG tablet Take 1 tablet (50 mg total) by mouth 3 (three) times daily. 09/16/19  Yes Tyrone Nine, MD  isosorbide mononitrate (IMDUR) 30 MG 24 hr tablet Take 30 mg by mouth daily.   Yes [provider]  metoprolol tartrate (LOPRESSOR) 50 MG tablet Take 100 mg by mouth 2 (two) times daily.    Yes [provider]  Multiple Vitamins-Minerals (MULTIVITAMIN MEN PO) Take 1 tablet by mouth daily.   Yes [provider]  potassium chloride 20 MEQ TBCR Take 20 mEq by mouth 2 (two) times daily. 09/16/19  Yes Tyrone Nine, MD     Review of Systems  Constitutional: Positive for fatigue. Negative for appetite change.  HENT: Negative for congestion, postnasal drip and sore throat.   Eyes: Negative.   Respiratory: Positive for chest tightness and shortness of breath (improving).   Cardiovascular: Positive for chest pain (at times). Negative for palpitations and leg swelling.  Gastrointestinal: Negative  for abdominal distention and abdominal pain.  Endocrine: Negative.   Genitourinary: Negative.   Musculoskeletal: Negative for back pain and neck pain.  Skin: Negative.   Allergic/Immunologic: Negative.   Neurological: Positive for light-headedness (with position changes). Negative for dizziness.  Hematological: Negative for adenopathy. Does not bruise/bleed easily.  Psychiatric/Behavioral: Positive for sleep disturbance (sleeping on 2 pillows). Negative for dysphoric mood. The patient is not nervous/anxious.    Vitals:   02/03/20 0932  BP: (!) 149/86  Pulse: 72  Resp: 18  SpO2: 98%  Weight:  265 lb 6 oz (120.4 kg)  Height: 5\' 6"  (1.676 m)   Wt Readings from Last 3 Encounters:  02/03/20 265 lb 6 oz (120.4 kg)  01/03/20 (!) 262 lb (118.8 kg)  11/22/19 264 lb 8 oz (120 kg)   Lab Results  Component Value Date   CREATININE 1.21 01/03/2020   CREATININE 1.23 11/22/2019   CREATININE 1.50 (H) 09/16/2019   Physical Exam Vitals and nursing note reviewed.  Constitutional:      Appearance: Normal appearance.  HENT:     Head: Normocephalic and atraumatic.  Cardiovascular:     Rate and Rhythm: Normal rate and regular rhythm.  Pulmonary:     Effort: Pulmonary effort is normal. No respiratory distress.     Breath sounds: No wheezing or rales.  Abdominal:     General: There is no distension.     Palpations: Abdomen is soft.  Musculoskeletal:        General: No tenderness.     Cervical back: Normal range of motion and neck supple.     Right lower leg: No edema.     Left lower leg: No edema.  Skin:    General: Skin is warm and dry.  Neurological:     General: No focal deficit present.     Mental Status: Benjamin Goodman is alert and oriented to person, place, and time.  Psychiatric:        Mood and Affect: Mood normal.        Behavior: Behavior normal.        Thought Content: Thought content normal.     Assessment & Plan:  1: Chronic heart failure with reduced ejection fraction- - NYHA class II - euvolemic today - weighing daily; reminded to call for an overnight weight gain of >2 pounds or a weekly weight gain of >5 pounds - weight up 3 pounds from last visit here 1 month ago - walking on treadmill daily for ~ 20 minutes  - not adding salt and has been reading food labels for sodium content; reviewed the importance of closely following a 2000mg  sodium diet  - saw cardiology ([name]) 12/09/19 - quite active at work - still finishing his metoprolol tartrate so once Benjamin Goodman gets close to finishing this, patient to call before running out so new RX for metoprolol succinate 200mg  daily can  be sent in - BNP 09/15/19 was 316.0  2: HTN- - BP looks good today but experiences low BP and dizziness at times - stop amlodipine and continue to check BP daily - has PCP appointment scheduled but not until December 2021 - BMP 01/03/20 reviewed and showed sodium 140, potassium 3.8, creatinine 1.21 and GFR >60  3: Snoring- - patient says that Benjamin Goodman wakes up just as tired as when Benjamin Goodman went to bed - has been told that Benjamin Goodman snores and that Benjamin Goodman has woken himself up snorting/ gasping - does work evening shift  - sleep study completed 11/19/19 but  Benjamin Goodman hasn't heard from them; will have NT call (again) and see if results can get faxed to [place]   Medication bottles reviewed.   Return in 1 month or sooner for any questions/problems before then.

## 2020-02-03 NOTE — Patient Instructions (Addendum)
Continue weighing daily and call for an overnight weight gain of > 2 pounds or a weekly weight gain of >5 pounds.  Stop taking amlodipine and continue checking blood pressure at home.

## 2020-03-08 NOTE — Progress Notes (Signed)
Patient ID: Benjamin Gamblin., male    DOB: [birth year], 34 y.o.   MRN: [id number]  HPI  Benjamin Goodman is a 34 y/o male with a history of HTN and chronic heart failure.  Echo report from 09/14/19 reviewed and showed an EF of 35-40% along with moderate LVH and trivial Benjamin.   Admitted 09/13/19 due to acute on chronic HF and HTN. Cardiology consult obtained. Initially given IV lasix and then transitioned to oral diuretics. Elevated troponin thought to be due to demand ischemia.  Discharged after 3 days.   He presents today for a follow-up visit with a chief complaint of minimal shortness of breath upon moderate exertion. Describes this as chronic in nature. Has associated fatigue, intermittent palpitations and light-headedness along with this. Denies any abdominal distention, pedal edema or weight gain.   Had an "episode" 2 weeks ago while push mowing the yard where he felt chest tightness, palpitations and dizziness. Said that he normally push mows the yard but was pushing faster to get his HR up for exercise. Sat down and symptoms slowly improved and he's not had any issues since then.   Still hasn't heard about this sleep study results from June 2021. Does work night shift from 4pm-4am.   Past Medical History:  Diagnosis Date  . CHF (congestive heart failure) (HCC)   . Hypertension    Past Surgical History:  Procedure Laterality Date  . TONSILLECTOMY     No family history on file. Social History   Tobacco Use  . Smoking status: Never Smoker  . Smokeless tobacco: Never Used  Substance Use Topics  . Alcohol use: No   No Known Allergies  Prior to Admission medications   Medication Sig Start Date End Date Taking? Authorizing Provider  acetaminophen (TYLENOL) 325 MG tablet Take 650 mg by mouth every 6 (six) hours as needed for mild pain or headache.   Yes [provider]  aspirin 325 MG tablet Take 325 mg by mouth daily.    Yes [provider]  ENTRESTO 49-51 MG TAKE 1  TABLET BY MOUTH 2 (TWO) TIMES DAILY. STOP THE 24/26 DOSE 01/31/20  Yes Clarisa Kindred A, FNP  furosemide (LASIX) 40 MG tablet Take 1 tablet (40 mg total) by mouth daily. 09/16/19 03/09/20 Yes Tyrone Nine, MD  hydrALAZINE (APRESOLINE) 50 MG tablet Take 1 tablet (50 mg total) by mouth 3 (three) times daily. 09/16/19  Yes Tyrone Nine, MD  isosorbide mononitrate (IMDUR) 30 MG 24 hr tablet Take 30 mg by mouth daily.   Yes [provider]  metoprolol tartrate (LOPRESSOR) 50 MG tablet Take 100 mg by mouth 2 (two) times daily.    Yes [provider]  Multiple Vitamins-Minerals (MULTIVITAMIN MEN PO) Take 1 tablet by mouth daily.   Yes [provider]  potassium chloride 20 MEQ TBCR Take 20 mEq by mouth 2 (two) times daily. 09/16/19  Yes Tyrone Nine, MD     Review of Systems  Constitutional: Positive for fatigue. Negative for appetite change.  HENT: Negative for congestion, postnasal drip and sore throat.   Eyes: Negative.   Respiratory: Positive for chest tightness (2 weeks ago) and shortness of breath (improving).   Cardiovascular: Positive for chest pain (at times) and palpitations (2 weeks ago). Negative for leg swelling.  Gastrointestinal: Negative for abdominal distention and abdominal pain.  Endocrine: Negative.   Genitourinary: Negative.   Musculoskeletal: Negative for back pain and neck pain.  Skin: Negative.  Allergic/Immunologic: Negative.   Neurological: Positive for light-headedness (2 weeks ago). Negative for dizziness.  Hematological: Negative for adenopathy. Does not bruise/bleed easily.  Psychiatric/Behavioral: Positive for sleep disturbance (sleeping on 2 pillows). Negative for dysphoric mood. The patient is not nervous/anxious.     Vitals:   03/09/20 0901 03/09/20 0918  BP: (!) 149/103 (!) 150/90  Pulse: 66   Resp: 20   SpO2: 99%   Weight: 264 lb 2 oz (119.8 kg)   Height: 5\' 8"  (1.727 m)    Wt Readings from Last 3 Encounters:  03/09/20 264 lb 2  oz (119.8 kg)  02/03/20 265 lb 6 oz (120.4 kg)  01/03/20 (!) 262 lb (118.8 kg)   Lab Results  Component Value Date   CREATININE 1.21 01/03/2020   CREATININE 1.23 11/22/2019   CREATININE 1.50 (H) 09/16/2019    Physical Exam Vitals and nursing note reviewed.  Constitutional:      Appearance: Normal appearance.  HENT:     Head: Normocephalic and atraumatic.  Cardiovascular:     Rate and Rhythm: Normal rate and regular rhythm.  Pulmonary:     Effort: Pulmonary effort is normal. No respiratory distress.     Breath sounds: No wheezing or rales.  Abdominal:     General: There is no distension.     Palpations: Abdomen is soft.  Musculoskeletal:        General: No tenderness.     Cervical back: Normal range of motion and neck supple.     Right lower leg: No edema.     Left lower leg: No edema.  Skin:    General: Skin is warm and dry.  Neurological:     General: No focal deficit present.     Mental Status: He is alert and oriented to person, place, and time.  Psychiatric:        Mood and Affect: Mood normal.        Behavior: Behavior normal.        Thought Content: Thought content normal.     Assessment & Plan:  1: Chronic heart failure with reduced ejection fraction- - NYHA class II - euvolemic today - weighing daily; reminded to call for an overnight weight gain of >2 pounds or a weekly weight gain of >5 pounds - weight stable from last visit here 1 month ago - walking on treadmill daily for ~ 20 minutes  - not adding salt and has been reading food labels for sodium content; reviewed the importance of closely following a 2000mg  sodium diet  - saw cardiology ([name]) 12/09/19; thinks he returns next month - quite active at work - [facility] auto filled a 3 month supply of his metoprolol tartrate; will plan on sending new RX for metoprolol succinate 200mg  daily at his next visit to stop the auto refill - will increase his entresto to 97/103mg  BID; he can finish his current  bottles by taking 2 tablets BID as he recently got a 3 month supply of the 49/51mg  dose - check BMP next visit - BNP 09/15/19 was 316.0 - has not gotten his covid vaccines nor his flu vaccine; has hesitancy about covid vaccines; talked with him about his concerns  2: HTN- - BP elevated but improved slightly after rechecking; increasing entresto per above; was out of entresto for ~ 2 weeks due to pharmacy issue; told him to call 02/09/20 for samples if that happens again - has PCP appointment scheduled but not until December 2021 - BMP 01/03/20 reviewed and  showed sodium 140, potassium 3.8, creatinine 1.21 and GFR >60  3: Snoring- - patient says that he wakes up just as tired as when he went to bed - has been told that he snores and that he has woken himself up snorting/ gasping - does work 4pm-4am  - sleep study completed 11/22/19 but he hasn't heard from them; NT called sleep lab at last visit and again this morning and unable to reach anyone; director of sleep lab has been contacted   Patient did not bring his medications nor a list. Each medication was verbally reviewed with the patient and he was encouraged to bring the bottles to every visit to confirm accuracy of list.  Return in 1 month or sooner for any questions/problems before then.

## 2020-03-09 ENCOUNTER — Telehealth: Payer: Self-pay | Admitting: Family

## 2020-03-09 ENCOUNTER — Encounter: Payer: Self-pay | Admitting: Family

## 2020-03-09 ENCOUNTER — Other Ambulatory Visit: Payer: Self-pay

## 2020-03-09 ENCOUNTER — Ambulatory Visit: Payer: BC Managed Care – PPO | Attending: Family | Admitting: Family

## 2020-03-09 VITALS — BP 150/90 | HR 66 | Resp 20 | Ht 68.0 in | Wt 264.1 lb

## 2020-03-09 DIAGNOSIS — I10 Essential (primary) hypertension: Secondary | ICD-10-CM

## 2020-03-09 DIAGNOSIS — Z79899 Other long term (current) drug therapy: Secondary | ICD-10-CM | POA: Diagnosis not present

## 2020-03-09 DIAGNOSIS — Z7982 Long term (current) use of aspirin: Secondary | ICD-10-CM | POA: Insufficient documentation

## 2020-03-09 DIAGNOSIS — I5022 Chronic systolic (congestive) heart failure: Secondary | ICD-10-CM | POA: Diagnosis present

## 2020-03-09 DIAGNOSIS — R0683 Snoring: Secondary | ICD-10-CM | POA: Diagnosis not present

## 2020-03-09 DIAGNOSIS — I110 Hypertensive heart disease with heart failure: Secondary | ICD-10-CM | POA: Diagnosis not present

## 2020-03-09 MED ORDER — SACUBITRIL-VALSARTAN 97-103 MG PO TABS: 1.0000 | ORAL_TABLET | Freq: Two times a day (BID) | ORAL | 3 refills | Status: DC

## 2020-03-09 NOTE — Telephone Encounter (Signed)
I have called [facility] on several occasions to get results on Benjamin Goodman from his sleep study in June and have not received any info nor a call back.   Elaf Clauson, NT

## 2020-03-09 NOTE — Patient Instructions (Addendum)
Continue weighing daily and call for an overnight weight gain of > 2 pounds or a weekly weight gain of >5 pounds.  Increase your entresto by taking 2 tablets in the morning and 2 tablets in the evening

## 2020-04-04 ENCOUNTER — Ambulatory Visit: Payer: BC Managed Care – PPO | Admitting: Family

## 2020-04-05 ENCOUNTER — Ambulatory Visit: Payer: BC Managed Care – PPO | Admitting: Family

## 2020-04-08 NOTE — Progress Notes (Signed)
Patient ID: Benjamin Goodman., male    DOB: 02/27/1986, 34 y.o.   MRN: [id number]  HPI  Benjamin Goodman is Goodman 34 y/o male with Goodman history of HTN and chronic heart failure.  Echo report from 09/14/19 reviewed and showed an EF of 35-40% along with moderate LVH and trivial Benjamin.   Has not been admitted or been in the ED in the last 6 months.    He presents today for Goodman follow-up visit with Goodman chief complaint of moderate shortness of breath with moderate exertion. Describes this as chronic in nature having been present for several months. Says that he was short of breath upon walking to the office today and has difficulty walking on the treadmill for long periods due to his shortness of breath. He has associated fatigue, chest tightness, palpitations and light-headedness along with this. He denies any abdominal distention, pedal edema, chest pain, cough or weight gain.   Does work night shift from 4pm-4am. Endorses waking up feeling just as tired as when he went to sleep.   Past Medical History:  Diagnosis Date  . CHF (congestive heart failure) (HCC)   . Hypertension    Past Surgical History:  Procedure Laterality Date  . TONSILLECTOMY     No family history on file. Social History   Tobacco Use  . Smoking status: Never Smoker  . Smokeless tobacco: Never Used  Substance Use Topics  . Alcohol use: No   No Known Allergies  Prior to Admission medications   Medication Sig Start Date End Date Taking? Authorizing Provider  acetaminophen (TYLENOL) 325 MG tablet Take 650 mg by mouth every 6 (six) hours as needed for mild pain or headache.   Yes [provider]  aspirin 325 MG tablet Take 325 mg by mouth daily.    Yes [provider]  hydrALAZINE (APRESOLINE) 50 MG tablet Take 1 tablet (50 mg total) by mouth 3 (three) times daily. 09/16/19  Yes Benjamin Nine, MD  isosorbide mononitrate (IMDUR) 30 MG 24 hr tablet Take 30 mg by mouth daily.   Yes [provider]  metoprolol  tartrate (LOPRESSOR) 50 MG tablet Take 100 mg by mouth 2 (two) times daily.    Yes [provider]  Multiple Vitamins-Minerals (MULTIVITAMIN MEN PO) Take 1 tablet by mouth daily.   Yes [provider]  potassium chloride 20 MEQ TBCR Take 20 mEq by mouth 2 (two) times daily. 09/16/19  Yes Benjamin Nine, MD  sacubitril-valsartan (ENTRESTO) 97-103 MG Take 1 tablet by mouth 2 (two) times daily. 04/10/20  Yes Benjamin Goodman, Benjamin Fermo A, FNP  furosemide (LASIX) 40 MG tablet Take 1 tablet (40 mg total) by mouth daily. 09/16/19 03/09/20  Benjamin Nine, MD     Review of Systems  Constitutional: Positive for fatigue. Negative for appetite change.  HENT: Negative for congestion, postnasal drip and sore throat.   Eyes: Negative.   Respiratory: Positive for chest tightness and shortness of breath.   Cardiovascular: Positive for palpitations (when exercising). Negative for chest pain and leg swelling.  Gastrointestinal: Negative for abdominal distention and abdominal pain.  Endocrine: Negative.   Genitourinary: Negative.   Musculoskeletal: Negative for back pain and neck pain.  Skin: Negative.   Allergic/Immunologic: Negative.   Neurological: Positive for light-headedness (at times). Negative for dizziness.  Hematological: Negative for adenopathy. Does not bruise/bleed easily.  Psychiatric/Behavioral: Positive for sleep disturbance (sleeping on 2 pillows). Negative for dysphoric mood. The patient is not nervous/anxious.  Vitals:   04/10/20 0856  BP: (!) 144/95  Pulse: 60  Resp: 18  SpO2: 96%  Weight: 267 lb 2 oz (121.2 kg)  Height: 5\' 8"  (1.727 m)   Wt Readings from Last 3 Encounters:  04/10/20 267 lb 2 oz (121.2 kg)  03/09/20 264 lb 2 oz (119.8 kg)  02/03/20 265 lb 6 oz (120.4 kg)   Lab Results  Component Value Date   CREATININE 1.21 01/03/2020   CREATININE 1.23 11/22/2019   CREATININE 1.50 (H) 09/16/2019    Physical Exam Vitals and nursing note reviewed.  Constitutional:       Appearance: Normal appearance.  HENT:     Head: Normocephalic and atraumatic.  Cardiovascular:     Rate and Rhythm: Normal rate and regular rhythm.  Pulmonary:     Effort: Pulmonary effort is normal. No respiratory distress.     Breath sounds: No wheezing or rales.  Abdominal:     General: There is no distension.     Palpations: Abdomen is soft.  Musculoskeletal:        General: No tenderness.     Cervical back: Normal range of motion and neck supple.     Right lower leg: No edema.     Left lower leg: No edema.  Skin:    General: Skin is warm and dry.  Neurological:     General: No focal deficit present.     Mental Status: He is alert and oriented to person, place, and time.  Psychiatric:        Mood and Affect: Mood normal.        Behavior: Behavior normal.        Thought Content: Thought content normal.     Assessment & Plan:  1: Chronic heart failure with reduced ejection fraction- - NYHA class II - euvolemic today - weighing daily; reminded to call for an overnight weight gain of >2 pounds or Goodman weekly weight gain of >5 pounds - weight up 3 pounds from last visit here 1 month ago - walking on treadmill daily for ~ 20 minutes; states difficult to walk longer due to shortness of breath - not adding salt and has been reading food labels for sodium content; reviewed the importance of closely following Goodman 2000mg  sodium diet  - saw cardiology ([name]) 03/13/20; returns ~ January 2022 - quite active at work - check BMP today since entresto increased at last visit to 97/103mg  - BNP 09/15/19 was 316.0 - has not gotten his covid vaccines nor his flu vaccine; has hesitancy about covid vaccines; talked with him about his concerns  2: HTN- - BP mildly elevated today - has PCP appointment scheduled but not until December 2021 - BMP 01/03/20 reviewed and showed sodium 140, potassium 3.8, creatinine 1.21 and GFR >60  3: Severe obstructive sleep apnea- - patient says that he wakes up  just as tired as when he went to bed - has been told that he snores and that he has woken himself up snorting/ gasping - does work 4pm-4am  - sleep study from 11/19/19 reviewed with patient; will send referral for CPAP titration per report   Patient did not bring his medications nor Goodman list. Each medication was verbally reviewed with the patient and he was encouraged to bring the bottles to every visit to confirm accuracy of list.  Return in 3 months or sooner for any questions/problems before then.

## 2020-04-10 ENCOUNTER — Ambulatory Visit: Payer: BC Managed Care – PPO | Attending: Family | Admitting: Family

## 2020-04-10 ENCOUNTER — Encounter: Payer: Self-pay | Admitting: Family

## 2020-04-10 ENCOUNTER — Other Ambulatory Visit: Payer: Self-pay

## 2020-04-10 VITALS — BP 144/95 | HR 60 | Resp 18 | Ht 68.0 in | Wt 267.1 lb

## 2020-04-10 DIAGNOSIS — R0789 Other chest pain: Secondary | ICD-10-CM | POA: Diagnosis not present

## 2020-04-10 DIAGNOSIS — I10 Essential (primary) hypertension: Secondary | ICD-10-CM

## 2020-04-10 DIAGNOSIS — G4733 Obstructive sleep apnea (adult) (pediatric): Secondary | ICD-10-CM | POA: Diagnosis not present

## 2020-04-10 DIAGNOSIS — R5383 Other fatigue: Secondary | ICD-10-CM | POA: Insufficient documentation

## 2020-04-10 DIAGNOSIS — I5022 Chronic systolic (congestive) heart failure: Secondary | ICD-10-CM | POA: Insufficient documentation

## 2020-04-10 DIAGNOSIS — Z79899 Other long term (current) drug therapy: Secondary | ICD-10-CM | POA: Diagnosis not present

## 2020-04-10 DIAGNOSIS — Z7982 Long term (current) use of aspirin: Secondary | ICD-10-CM | POA: Diagnosis not present

## 2020-04-10 DIAGNOSIS — R0602 Shortness of breath: Secondary | ICD-10-CM | POA: Diagnosis present

## 2020-04-10 DIAGNOSIS — R002 Palpitations: Secondary | ICD-10-CM | POA: Insufficient documentation

## 2020-04-10 DIAGNOSIS — R42 Dizziness and giddiness: Secondary | ICD-10-CM | POA: Diagnosis not present

## 2020-04-10 DIAGNOSIS — I110 Hypertensive heart disease with heart failure: Secondary | ICD-10-CM | POA: Insufficient documentation

## 2020-04-10 LAB — BASIC METABOLIC PANEL
Anion gap: 9 (ref 5–15)
BUN: 12 mg/dL (ref 6–20)
CO2: 26 mmol/L (ref 22–32)
Calcium: 8.7 mg/dL — ABNORMAL LOW (ref 8.9–10.3)
Chloride: 104 mmol/L (ref 98–111)
Creatinine, Ser: 1.09 mg/dL (ref 0.61–1.24)
GFR, Estimated: >60 mL/min (ref >60)
Glucose, Bld: 110 mg/dL — ABNORMAL HIGH (ref 70–99)
Potassium: 3.6 mmol/L (ref 3.5–5.1)
Sodium: 139 mmol/L (ref 135–145)

## 2020-04-10 MED ORDER — SACUBITRIL-VALSARTAN 97-103 MG PO TABS: 1.0000 | ORAL_TABLET | Freq: Two times a day (BID) | ORAL | 3 refills | Status: DC

## 2020-04-10 NOTE — Patient Instructions (Signed)
Continue weighing daily and call for an overnight weight gain of > 2 pounds or a weekly weight gain of >5 pounds. 

## 2020-05-09 ENCOUNTER — Ambulatory Visit: Payer: BC Managed Care – PPO | Attending: Otolaryngology

## 2020-05-09 DIAGNOSIS — G4733 Obstructive sleep apnea (adult) (pediatric): Secondary | ICD-10-CM | POA: Insufficient documentation

## 2020-05-10 ENCOUNTER — Other Ambulatory Visit: Payer: Self-pay

## 2020-07-16 NOTE — Progress Notes (Signed)
Patient ID: Benjamin Goodman., male    DOB: 1985-07-19, 35 y.o.   MRN: [id number]  HPI  Mr Benjamin Goodman is a 35 y/o male with a history of HTN and chronic heart failure.  Echo report from 09/14/19 reviewed and showed an EF of 35-40% along with moderate LVH and trivial MR.   Has not been admitted or been in the ED in the last 6 months.    He presents today for a follow-up visit with a chief complaint of minimal shortness of breath upon moderate exertion. He describes this as chronic in nature having been present for several years. He has associated fatigue, occasional chest tightness/ pain and difficulty sleeping along with this. He denies any dizziness, abdominal distention, palpitations, pedal edema or weight gain.   Does work night shift from 4pm-4am. Endorses waking up feeling just as tired as when he went to sleep. Still waiting on CPAP equipment, says the company said there was a recall.    Has NP appointment with PCP later today. Says that he took his medications ~ 1 hour before his appointment.   Past Medical History:  Diagnosis Date  . CHF (congestive heart failure) (HCC)   . Hypertension    Past Surgical History:  Procedure Laterality Date  . TONSILLECTOMY     No family history on file. Social History   Tobacco Use  . Smoking status: Never Smoker  . Smokeless tobacco: Never Used  Substance Use Topics  . Alcohol use: No   No Known Allergies  Prior to Admission medications   Medication Sig Start Date End Date Taking? Authorizing Provider  acetaminophen (TYLENOL) 325 MG tablet Take 650 mg by mouth every 6 (six) hours as needed for mild pain or headache.   Yes [provider]  aspirin 325 MG tablet Take 325 mg by mouth daily.    Yes [provider]  furosemide (LASIX) 40 MG tablet Take 1 tablet (40 mg total) by mouth daily. 09/16/19 03/09/20 Yes Tyrone Nine, MD  hydrALAZINE (APRESOLINE) 50 MG tablet Take 1 tablet (50 mg total) by mouth 3 (three) times daily.  09/16/19  Yes Tyrone Nine, MD  isosorbide mononitrate (IMDUR) 30 MG 24 hr tablet Take 30 mg by mouth daily.   Yes [provider]  metoprolol tartrate (LOPRESSOR) 100 MG tablet Take 100 mg by mouth 2 (two) times daily.   Yes [provider]  Multiple Vitamins-Minerals (MULTIVITAMIN MEN PO) Take 1 tablet by mouth daily.   Yes [provider]  potassium chloride 20 MEQ TBCR Take 20 mEq by mouth 2 (two) times daily. 09/16/19  Yes Tyrone Nine, MD  sacubitril-valsartan (ENTRESTO) 97-103 MG Take 1 tablet by mouth 2 (two) times daily. 04/10/20  Yes Delma Freeze, FNP    Review of Systems  Constitutional: Positive for fatigue. Negative for appetite change.  HENT: Negative for congestion, postnasal drip and sore throat.   Eyes: Negative.   Respiratory: Positive for chest tightness and shortness of breath.   Cardiovascular: Positive for chest pain (with walking long distance). Negative for palpitations and leg swelling.  Gastrointestinal: Negative for abdominal distention and abdominal pain.  Endocrine: Negative.   Genitourinary: Negative.   Musculoskeletal: Negative for back pain and neck pain.  Skin: Negative.   Allergic/Immunologic: Negative.   Neurological: Negative for dizziness and light-headedness.  Hematological: Negative for adenopathy. Does not bruise/bleed easily.  Psychiatric/Behavioral: Positive for sleep disturbance (sleeping on 2 pillows). Negative for dysphoric mood. The patient  is not nervous/anxious.    Vitals:   07/18/20 0858  BP: (!) 157/108  Pulse: 75  Resp: 20  SpO2: 98%  Weight: 277 lb 2 oz (125.7 kg)  Height: 5\' 8"  (1.727 m)   Wt Readings from Last 3 Encounters:  07/18/20 277 lb 2 oz (125.7 kg)  04/10/20 267 lb 2 oz (121.2 kg)  03/09/20 264 lb 2 oz (119.8 kg)   Lab Results  Component Value Date   CREATININE 1.09 04/10/2020   CREATININE 1.21 01/03/2020   CREATININE 1.23 11/22/2019    Physical Exam Vitals and nursing note reviewed.   Constitutional:      Appearance: Normal appearance.  HENT:     Head: Normocephalic and atraumatic.  Cardiovascular:     Rate and Rhythm: Normal rate and regular rhythm.  Pulmonary:     Effort: Pulmonary effort is normal. No respiratory distress.     Breath sounds: No wheezing or rales.  Abdominal:     General: There is no distension.     Palpations: Abdomen is soft.  Musculoskeletal:        General: No tenderness.     Cervical back: Normal range of motion and neck supple.     Right lower leg: No edema.     Left lower leg: No edema.  Skin:    General: Skin is warm and dry.  Neurological:     General: No focal deficit present.     Mental Status: He is alert and oriented to person, place, and time.  Psychiatric:        Mood and Affect: Mood normal.        Behavior: Behavior normal.        Thought Content: Thought content normal.     Assessment & Plan:  1: Chronic heart failure with reduced ejection fraction- - NYHA class II - euvolemic today - weighing daily; reminded to call for an overnight weight gain of >2 pounds or a weekly weight gain of >5 pounds - weight up 3 pounds from last visit here 1 month ago - walking on treadmill daily for ~ 20 minutes; states difficult to walk longer due to shortness of breath - not adding salt and has been reading food labels for sodium content; reviewed the importance of closely following a 2000mg  sodium diet  - saw cardiology ([name]) 06/15/20 - quite active at work - BNP 09/15/19 was 316.0 - has not gotten his covid vaccines nor his flu vaccine  2: HTN- - BP elevated although better from previous cardiology visit; took medications ~ 1 hour before his appointment - has PCP appointment scheduled later today with Dr. 08/13/20 so will not adjust any medications  - BMP 04/10/20 reviewed and showed sodium 139, potassium 3.6, creatinine 1.09 and GFR >60  3: Severe obstructive sleep apnea- - patient says that he wakes up just as tired as  when he went to bed - has been told that he snores and that he has woken himself up snorting/ gasping - does work 4pm-4am  - CPAP titration done 05/17/20 with recommendation of setting of 11cmH2O - will fax order for this as unclear if the order has been sent although patient says the company told him there was a recall on his equipment which is why he doesn't have it yet   Patient did not bring his medications nor a list. Each medication was verbally reviewed with the patient and he was encouraged to bring the bottles to every visit to confirm accuracy  of list.  Return in 3 months or sooner for any questions/problems before then.

## 2020-07-18 ENCOUNTER — Encounter: Payer: Self-pay | Admitting: Family

## 2020-07-18 ENCOUNTER — Ambulatory Visit: Payer: BC Managed Care – PPO | Attending: Family | Admitting: Family

## 2020-07-18 ENCOUNTER — Other Ambulatory Visit: Payer: Self-pay

## 2020-07-18 VITALS — BP 157/108 | HR 75 | Resp 20 | Ht 68.0 in | Wt 277.1 lb

## 2020-07-18 DIAGNOSIS — R5383 Other fatigue: Secondary | ICD-10-CM | POA: Insufficient documentation

## 2020-07-18 DIAGNOSIS — I10 Essential (primary) hypertension: Secondary | ICD-10-CM

## 2020-07-18 DIAGNOSIS — Z7901 Long term (current) use of anticoagulants: Secondary | ICD-10-CM | POA: Diagnosis not present

## 2020-07-18 DIAGNOSIS — R0602 Shortness of breath: Secondary | ICD-10-CM | POA: Diagnosis present

## 2020-07-18 DIAGNOSIS — I110 Hypertensive heart disease with heart failure: Secondary | ICD-10-CM | POA: Insufficient documentation

## 2020-07-18 DIAGNOSIS — Z7982 Long term (current) use of aspirin: Secondary | ICD-10-CM | POA: Insufficient documentation

## 2020-07-18 DIAGNOSIS — I5022 Chronic systolic (congestive) heart failure: Secondary | ICD-10-CM | POA: Insufficient documentation

## 2020-07-18 DIAGNOSIS — R0789 Other chest pain: Secondary | ICD-10-CM | POA: Insufficient documentation

## 2020-07-18 DIAGNOSIS — G4733 Obstructive sleep apnea (adult) (pediatric): Secondary | ICD-10-CM | POA: Insufficient documentation

## 2020-07-18 NOTE — Patient Instructions (Addendum)
Continue weighing daily and call for an overnight weight gain of > 2 pounds or a weekly weight gain of >5 pounds.   Call [facility] at [phone] and ask about your CPAP equipment.

## 2020-10-15 NOTE — Progress Notes (Signed)
Patient ID: Benjamin Dacus., male    DOB: [birth year], 35 y.o.   MRN: [id number]  HPI  Benjamin Goodman is a 35 y/o male with a history of HTN and chronic heart failure.  Echo report from 09/14/19 reviewed and showed an EF of 35-40% along with moderate LVH and trivial Benjamin.   Has not been admitted or been in the ED in the last 6 months.    Benjamin Goodman presents today for a follow-up visit with a chief complaint of   Does work night shift from 4pm-4am. Endorses waking up feeling just as tired as when Benjamin Goodman went to sleep. Still waiting on CPAP equipment, says the company said there was a recall.      Past Medical History:  Diagnosis Date  . CHF (congestive heart failure) (HCC)   . Hypertension    Past Surgical History:  Procedure Laterality Date  . TONSILLECTOMY     No family history on file. Social History   Tobacco Use  . Smoking status: Never Smoker  . Smokeless tobacco: Never Used  Substance Use Topics  . Alcohol use: No   No Known Allergies  Prior to Admission medications   Medication Sig Start Date End Date Taking? Authorizing Provider  acetaminophen (TYLENOL) 325 MG tablet Take 650 mg by mouth every 6 (six) hours as needed for mild pain or headache.   Yes [provider]  aspirin 325 MG tablet Take 325 mg by mouth daily.    Yes [provider]  cloNIDine (CATAPRES) 0.2 MG tablet Take 0.2 mg by mouth 2 (two) times daily.   Yes [provider]  furosemide (LASIX) 40 MG tablet Take 1 tablet (40 mg total) by mouth daily. 09/16/19 03/09/20 Yes Tyrone Nine, MD  hydrALAZINE (APRESOLINE) 50 MG tablet Take 1 tablet (50 mg total) by mouth 3 (three) times daily. 09/16/19  Yes Tyrone Nine, MD  isosorbide mononitrate (IMDUR) 30 MG 24 hr tablet Take 30 mg by mouth daily.   Yes [provider]  metoprolol tartrate (LOPRESSOR) 100 MG tablet Take 100 mg by mouth 2 (two) times daily.   Yes [provider]  Multiple Vitamins-Minerals (MULTIVITAMIN MEN PO) Take  1 tablet by mouth daily.   Yes [provider]  potassium chloride 20 MEQ TBCR Take 20 mEq by mouth 2 (two) times daily. Patient taking differently: Take 20 mEq by mouth daily. 09/16/19  Yes Tyrone Nine, MD  sacubitril-valsartan (ENTRESTO) 97-103 MG Take 1 tablet by mouth 2 (two) times daily. 04/10/20  Yes Rami Budhu, Inetta Fermo A, FNP  sertraline (ZOLOFT) 100 MG tablet Take 100 mg by mouth daily.   Yes [provider]   Review of Systems  Constitutional: Positive for fatigue. Negative for appetite change.  HENT: Negative for congestion, postnasal drip and sore throat.   Eyes: Negative.   Respiratory: Positive for shortness of breath ("very little"). Negative for cough and chest tightness.   Cardiovascular: Negative for chest pain, palpitations and leg swelling.  Gastrointestinal: Negative for abdominal distention and abdominal pain.  Endocrine: Negative.   Genitourinary: Negative.   Musculoskeletal: Negative for back pain and neck pain.  Skin: Negative.   Allergic/Immunologic: Negative.   Neurological: Positive for dizziness (4-5 times/ week when standing) and headaches (at times). Negative for light-headedness.  Hematological: Negative for adenopathy. Does not bruise/bleed easily.  Psychiatric/Behavioral: Positive for sleep disturbance (sleeping on 2 pillows). Negative for dysphoric mood. The patient is not nervous/anxious.    Vitals:  10/16/20 0903  BP: (!) 147/86  Pulse: 65  Resp: 20  SpO2: 100%  Weight: 269 lb 8 oz (122.2 kg)  Height: 5\' 8"  (1.727 m)   Wt Readings from Last 3 Encounters:  10/16/20 269 lb 8 oz (122.2 kg)  07/18/20 277 lb 2 oz (125.7 kg)  04/10/20 267 lb 2 oz (121.2 kg)   Lab Results  Component Value Date   CREATININE 1.09 04/10/2020   CREATININE 1.21 01/03/2020   CREATININE 1.23 11/22/2019    Physical Exam Vitals and nursing note reviewed.  Constitutional:      Appearance: Normal appearance.  HENT:     Head: Normocephalic and atraumatic.   Cardiovascular:     Rate and Rhythm: Normal rate and regular rhythm.  Pulmonary:     Effort: Pulmonary effort is normal. No respiratory distress.     Breath sounds: No wheezing or rales.  Abdominal:     General: There is no distension.     Palpations: Abdomen is soft.  Musculoskeletal:        General: No tenderness.     Cervical back: Normal range of motion and neck supple.     Right lower leg: No edema.     Left lower leg: No edema.  Skin:    General: Skin is warm and dry.  Neurological:     General: No focal deficit present.     Mental Status: Benjamin Goodman is alert and oriented to person, place, and time.  Psychiatric:        Mood and Affect: Mood normal.        Behavior: Behavior normal.        Thought Content: Thought content normal.     Assessment & Plan:  1: Chronic heart failure with reduced ejection fraction- - NYHA class II - euvolemic today - weighing daily; reminded to call for an overnight weight gain of >2 pounds or a weekly weight gain of >5 pounds - weight down 8 pounds from last visit here 3 months ago - walking on treadmill daily for ~ 20 minutes - not adding salt and has been reading food labels for sodium content; reviewed the importance of closely following a 2000mg  sodium diet  - saw cardiology ([name]) 09/14/20 - on GDMT of entresto  - will change metoprolol from 100mg  BID tartrate to succinate 200mg  daily - also add spironolactone 25mg  daily - check BMP next visit - consider SGLT2 in future; consider changing isosorbide/hydralazine to bidil - quite active at work - BNP 09/15/19 was 316.0 - PharmD reconciled medications with the patient  2: HTN- - BP mildly elevated today; changing metoprolol and adding spironolactone per above - would like to wean off clonidine and adjust other HF/HTN meds; Benjamin Goodman's to check his BP daily and if it's <130/80, Benjamin Goodman's to take 1/2 tablet of clonidine BID - reports feeling drowsy with taking clonidine - saw PCP 11/14/20)  09/26/20 - BMP 07/18/20 reviewed and showed sodium 140, potassium 4.0, creatinine 1.2 and GFR 70 - check BMP today before starting spironolactone since Benjamin Goodman's also on potassium tablet  3: Severe obstructive sleep apnea- - has been told that Benjamin Goodman snores and that Benjamin Goodman has woken himself up snorting/ gasping - does work 4pm-4am  - wearing CPAP daily and feels a little better since wearing it; does admit that Benjamin Goodman's still getting adjusted to wearing it   Medication bottles reviewed.   Return in 2 weeks or sooner for any questions/problems before then.

## 2020-10-16 ENCOUNTER — Other Ambulatory Visit: Payer: Self-pay

## 2020-10-16 ENCOUNTER — Ambulatory Visit: Payer: BC Managed Care – PPO | Attending: Family | Admitting: Family

## 2020-10-16 ENCOUNTER — Encounter: Payer: Self-pay | Admitting: Family

## 2020-10-16 VITALS — BP 147/86 | HR 65 | Resp 20 | Ht 68.0 in | Wt 269.5 lb

## 2020-10-16 DIAGNOSIS — Z79899 Other long term (current) drug therapy: Secondary | ICD-10-CM | POA: Diagnosis not present

## 2020-10-16 DIAGNOSIS — G4733 Obstructive sleep apnea (adult) (pediatric): Secondary | ICD-10-CM | POA: Diagnosis not present

## 2020-10-16 DIAGNOSIS — I110 Hypertensive heart disease with heart failure: Secondary | ICD-10-CM | POA: Insufficient documentation

## 2020-10-16 DIAGNOSIS — I5022 Chronic systolic (congestive) heart failure: Secondary | ICD-10-CM | POA: Diagnosis present

## 2020-10-16 DIAGNOSIS — Z7982 Long term (current) use of aspirin: Secondary | ICD-10-CM | POA: Diagnosis not present

## 2020-10-16 DIAGNOSIS — I10 Essential (primary) hypertension: Secondary | ICD-10-CM

## 2020-10-16 LAB — BASIC METABOLIC PANEL
Anion gap: 8 (ref 5–15)
BUN: 11 mg/dL (ref 6–20)
CO2: 25 mmol/L (ref 22–32)
Calcium: 8.7 mg/dL — ABNORMAL LOW (ref 8.9–10.3)
Chloride: 104 mmol/L (ref 98–111)
Creatinine, Ser: 1.02 mg/dL (ref 0.61–1.24)
GFR, Estimated: >60 mL/min (ref >60)
Glucose, Bld: 118 mg/dL — ABNORMAL HIGH (ref 70–99)
Potassium: 3.4 mmol/L — ABNORMAL LOW (ref 3.5–5.1)
Sodium: 137 mmol/L (ref 135–145)

## 2020-10-16 MED ORDER — METOPROLOL SUCCINATE ER 200 MG PO TB24: 200.0000 mg | ORAL_TABLET | Freq: Every day | ORAL | 3 refills | Status: DC

## 2020-10-16 MED ORDER — SPIRONOLACTONE 25 MG PO TABS: 25.0000 mg | ORAL_TABLET | Freq: Every day | ORAL | 3 refills | Status: DC

## 2020-10-16 NOTE — Patient Instructions (Addendum)
Resume weighing daily and call for an overnight weight gain of > 2 pounds or a weekly weight gain of >5 pounds.   Stop taking metoprolol tartrate (lopressor) and being metoprolol succinate 200mg  once a day   Begin spironolactone 25mg  daily   Check blood pressure daily and write the readings down. If your blood pressure is less than 130/ 80, decrease clonidine to 1/2 tablet twice a day

## 2020-10-16 NOTE — Progress Notes (Addendum)
[facility] - PHARMACIST COUNSELING NOTE  ADHERENCE ASSESSMENT  Adherence strategy: pill box and alarms   Do you ever forget to take your medication? [x] Yes (1) [] No (0)  Do you ever skip doses due to side effects? [] Yes (1) [x] No (0)  Do you have trouble affording your medicines? [] Yes (1) [x] No (0)  Are you ever unable to pick up your medication due to transportation difficulties? [] Yes (1) [x] No (0)  Do you ever stop taking your medications because you don't believe they are helping? [] Yes (1) [x] No (0)  Total score _1______    Recommendations given to patient about increasing adherence: Pt complains of missing evening medication doses but has started using a pill box and alarms to help remember doses.   Guideline-Directed Medical Therapy/Evidence Based Medicine  ACE/ARB/ARNI: [facility] Corporal Blocker: metoprolol tartrate Aldosterone Antagonist: N/A Diuretic: furosemide    SUBJECTIVE  HPI:  Past Medical History:  Diagnosis Date  . CHF (congestive heart failure) ([facility])   . Hypertension         OBJECTIVE   Vital signs: HR 65, BP 147/86, weight (pounds) 269 ECHO: Date 09/14/19, EF 35-40%  BMP Latest Ref Rng & Units 04/10/2020 01/03/2020 11/22/2019  Glucose 70 - 99 mg/dL 110(H) 100(H) 104(H)  BUN 6 - 20 mg/dL 12 16 12   Creatinine 0.61 - 1.24 mg/dL 1.09 1.21 1.23  Sodium 135 - 145 mmol/L 139 140 141  Potassium 3.5 - 5.1 mmol/L 3.6 3.8 3.8  Chloride 98 - 111 mmol/L 104 104 107  CO2 22 - 32 mmol/L 26 28 27   Calcium 8.9 - 10.3 mg/dL 8.7(L) 9.0 9.0    ASSESSMENT 35 yo M presenting to heart failure clinic for follow-up visit. PMH includes CHF and HTN. Pt complains of missing evening medication doses but has started using a pill box and alarms to help remember doses. No additional barriers to adherence identified during medication reconciliation.   PLAN CHF/HTN - Continue furosemide 40 mg daily and KCl 20 mEq daily  - Continue  Entresto 97-103 mg twice daily  - Continue hydralazine 50 mg three times daily  - Begin taking spironolactone 25 mg daily  - Recheck K level - may need to decrease KCl supplementation with addition of  spironolactone    - Repeat K level 3.4 - continue current regimen of KCl 20 mEq daily. Pt   returns to clinic in 2 weeks. Consider rechecking K level at visit.  - Stop taking metoprolol tartrate 100 mg twice daily and begin taking metoprolol succinate 200 mg once daily in place - Begin attempting to taper clonidine by taking 1/2 tablet BID if BP < 130/80  Chest pain - Continue isosorbide mononitrate 30 mg daily - Pt prescribed aspirin 325 mg when discharged from hospital on 09/16/19 for hypertensive urgency and elevated troponins - consider discontinuing aspirin daily   Pain - Continue acetaminophen use as needed  Mood - Continue sertraline 100 mg daily   General Health - Continue multivitamin daily    Time spent: 15 minutes  Benn Moulder, PharmD Pharmacy Resident  10/16/2020 9:22 AM   Current Outpatient Medications:  .  acetaminophen (TYLENOL) 325 MG tablet, Take 650 mg by mouth every 6 (six) hours as needed for mild pain or headache., Disp: , Rfl:  .  aspirin 325 MG tablet, Take 325 mg by mouth daily. , Disp: , Rfl:  .  furosemide (LASIX) 40 MG tablet, Take 1 tablet (40 mg total) by mouth daily.,  Disp: 30 tablet, Rfl: 0 .  hydrALAZINE (APRESOLINE) 50 MG tablet, Take 1 tablet (50 mg total) by mouth 3 (three) times daily., Disp: 90 tablet, Rfl: 0 .  isosorbide mononitrate (IMDUR) 30 MG 24 hr tablet, Take 30 mg by mouth daily., Disp: , Rfl:  .  metoprolol tartrate (LOPRESSOR) 100 MG tablet, Take 100 mg by mouth 2 (two) times daily., Disp: , Rfl:  .  Multiple Vitamins-Minerals (MULTIVITAMIN MEN PO), Take 1 tablet by mouth daily., Disp: , Rfl:  .  potassium chloride 20 MEQ TBCR, Take 20 mEq by mouth 2 (two) times daily., Disp: 60 tablet, Rfl: 0 .  sacubitril-valsartan (ENTRESTO)  97-103 MG, Take 1 tablet by mouth 2 (two) times daily., Disp: 180 tablet, Rfl: 3   COUNSELING POINTS/CLINICAL PEARLS  Metoprolol Succinate (Goal: 200 mg once daily) Warn patient to avoid activities requiring mental alertness or coordination until drug effects are realized, as drug may cause dizziness. Tell patient planning major surgery with anesthesia to alert physician that drug is being used, as drug impairs ability of heart to respond to reflex adrenergic stimuli. Drug may cause diarrhea, fatigue, headache, or depression. Advise diabetic patient to carefully monitor blood glucose as drug may mask symptoms of hypoglycemia. Patient should take extended-release tablet with or immediately following meals. Counsel patient against sudden discontinuation of drug, as this may precipitate hypertension, angina, or myocardial infarction. In the event of a missed dose, counsel patient to skip the missed dose and maintain a regular dosing schedule. Entresto (Goal: 97/103 mg twice daily)  Warn male patient to avoid pregnancy during therapy and to report a pregnancy to a physician.  Advise patient to report symptomatic hypotension.  Side effects may include hyperkalemia, cough, dizziness, or renal failure. Furosemide  Drug causes sun-sensitivity. Advise patient to use sunscreen and avoid tanning beds. Patient should avoid activities requiring coordination until drug effects are realized, as drug may cause dizziness, vertigo, or blurred vision. This drug may cause hyperglycemia, hyperuricemia, constipation, diarrhea, loss of appetite, nausea, vomiting, purpuric disorder, cramps, spasticity, asthenia, headache, paresthesia, or scaling eczema. Instruct patient to report unusual bleeding/bruising or signs/symptoms of hypotension, infection, pancreatitis, or ototoxicity (tinnitus, hearing impairment). Advise patient to report signs/symptoms of a severe skin reactions (flu-like symptoms, spreading red rash, or  skin/mucous membrane blistering) or erythema multiforme. Instruct patient to eat high-potassium foods during drug therapy, as directed by healthcare professional.  Patient should not drink alcohol while taking this drug. Spironolactone  Warn patient to report dehydration, hypotension, or symptoms of worsening renal function.  Counsel male patient to report gynecomastia.  Side effects may include diarrhea, nausea, vomiting, abdominal cramping, fever, leg cramps, lethargy, mental confusion, decreased libido, irregular menses, and rash. Suspension: Tell patient to take drug consistently with respect to food, either before or after a meal.  Advise patient to avoid potassium supplements and foods containing high levels of potassium, including salt substitutes.  DRUGS TO AVOID IN HEART FAILURE  Drug or Class Mechanism  Analgesics . NSAIDs . COX-2 inhibitors . Glucocorticoids  Sodium and water retention, increased systemic vascular resistance, decreased response to diuretics   Diabetes Medications . Metformin . Thiazolidinediones o Rosiglitazone (Avandia) o Pioglitazone (Actos) . DPP4 Inhibitors o Saxagliptin (Onglyza) o Sitagliptin (Januvia)   Lactic acidosis Possible calcium channel blockade   Unknown  Antiarrhythmics . Class I  o Flecainide o Disopyramide . Class III o Sotalol . Other o Dronedarone  Negative inotrope, proarrhythmic   Proarrhythmic, beta blockade  Negative inotrope  Antihypertensives . Alpha  Blockers o Doxazosin . Calcium Channel Blockers o Diltiazem o Verapamil o Nifedipine . Central Alpha Adrenergics o Moxonidine . Peripheral Vasodilators o Minoxidil  Increases renin and aldosterone  Negative inotrope    Possible sympathetic withdrawal  Unknown  Anti-infective . Itraconazole . Amphotericin B  Negative inotrope Unknown  Hematologic . Anagrelide . Cilostazol   Possible inhibition of PD IV Inhibition of PD III causing arrhythmias   Neurologic/Psychiatric . Stimulants . Anti-Seizure Drugs o Carbamazepine o Pregabalin . Antidepressants o Tricyclics o Citalopram . Parkinsons o Bromocriptine o Pergolide o Pramipexole . Antipsychotics o Clozapine . Antimigraine o Ergotamine o Methysergide . Appetite suppressants . Bipolar o Lithium  Peripheral alpha and beta agonist activity  Negative inotrope and chronotrope Calcium channel blockade  Negative inotrope, proarrhythmic Dose-dependent QT prolongation  Excessive serotonin activity/valvular damage Excessive serotonin activity/valvular damage Unknown  IgE mediated hypersensitivy, calcium channel blockade  Excessive serotonin activity/valvular damage Excessive serotonin activity/valvular damage Valvular damage  Direct myofibrillar degeneration, adrenergic stimulation  Antimalarials . Chloroquine . Hydroxychloroquine Intracellular inhibition of lysosomal enzymes  Urologic Agents . Alpha Blockers o Doxazosin o Prazosin o Tamsulosin o Terazosin  Increased renin and aldosterone  Adapted from Page RL, et al. "Drugs That May Cause or Exacerbate Heart Failure: A Scientific Statement from the [place]." Circulation 2016; 037:Q96-K38. DOI: 10.1161/CIR.0000000000000426   MEDICATION ADHERENCES TIPS AND STRATEGIES 1. Taking medication as prescribed improves patient outcomes in heart failure (reduces hospitalizations, improves symptoms, increases survival) 2. Side effects of medications can be managed by decreasing doses, switching agents, stopping drugs, or adding additional therapy. Please let someone in the [facility] know if you have having bothersome side effects so we can modify your regimen. Do not alter your medication regimen without talking to [place].  3. Medication reminders can help patients remember to take drugs on time. If you are missing or forgetting doses you can try linking behaviors, using pill boxes, or an  electronic reminder like an alarm on your phone or an app. Some people can also get automated phone calls as medication reminders.

## 2020-11-02 ENCOUNTER — Other Ambulatory Visit: Payer: Self-pay

## 2020-11-02 ENCOUNTER — Ambulatory Visit (HOSPITAL_BASED_OUTPATIENT_CLINIC_OR_DEPARTMENT_OTHER): Payer: BC Managed Care – PPO | Admitting: Family

## 2020-11-02 ENCOUNTER — Encounter: Payer: Self-pay | Admitting: Family

## 2020-11-02 ENCOUNTER — Encounter: Payer: Self-pay | Admitting: Pharmacist

## 2020-11-02 ENCOUNTER — Other Ambulatory Visit
Admission: RE | Admit: 2020-11-02 | Discharge: 2020-11-02 | Disposition: A | Discharge status: Home / Self Care | Payer: BC Managed Care – PPO | Source: Ambulatory Visit | Attending: Family | Admitting: Family

## 2020-11-02 VITALS — BP 130/80 | HR 60 | Resp 20 | Ht 68.0 in | Wt 268.5 lb

## 2020-11-02 DIAGNOSIS — G4733 Obstructive sleep apnea (adult) (pediatric): Secondary | ICD-10-CM

## 2020-11-02 DIAGNOSIS — I110 Hypertensive heart disease with heart failure: Secondary | ICD-10-CM | POA: Insufficient documentation

## 2020-11-02 DIAGNOSIS — Z7982 Long term (current) use of aspirin: Secondary | ICD-10-CM | POA: Diagnosis not present

## 2020-11-02 DIAGNOSIS — Z79899 Other long term (current) drug therapy: Secondary | ICD-10-CM | POA: Insufficient documentation

## 2020-11-02 DIAGNOSIS — R42 Dizziness and giddiness: Secondary | ICD-10-CM | POA: Diagnosis not present

## 2020-11-02 DIAGNOSIS — I10 Essential (primary) hypertension: Secondary | ICD-10-CM

## 2020-11-02 DIAGNOSIS — R0602 Shortness of breath: Secondary | ICD-10-CM | POA: Diagnosis present

## 2020-11-02 DIAGNOSIS — I5022 Chronic systolic (congestive) heart failure: Secondary | ICD-10-CM | POA: Insufficient documentation

## 2020-11-02 DIAGNOSIS — Z7901 Long term (current) use of anticoagulants: Secondary | ICD-10-CM | POA: Diagnosis not present

## 2020-11-02 LAB — BASIC METABOLIC PANEL
Anion gap: 10 (ref 5–15)
BUN: 15 mg/dL (ref 6–20)
CO2: 25 mmol/L (ref 22–32)
Calcium: 8.9 mg/dL (ref 8.9–10.3)
Chloride: 102 mmol/L (ref 98–111)
Creatinine, Ser: 1.23 mg/dL (ref 0.61–1.24)
GFR, Estimated: >60 mL/min (ref >60)
Glucose, Bld: 122 mg/dL — ABNORMAL HIGH (ref 70–99)
Potassium: 3.9 mmol/L (ref 3.5–5.1)
Sodium: 137 mmol/L (ref 135–145)

## 2020-11-02 NOTE — Patient Instructions (Addendum)
Continue weighing daily and call for an overnight weight gain of > 2 pounds or a weekly weight gain of >5 pounds.   Increase hydralazine to 2 tablets in the morning and then 1 tablet the other 2 times.

## 2020-11-02 NOTE — Progress Notes (Signed)
Patient ID: Benjamin Wolford., male    DOB: 1985/11/06, 35 y.o.   MRN: [id number]  HPI  Benjamin Goodman is a 35 y/o male with a history of HTN and chronic heart failure.  Echo report from 09/14/19 reviewed and showed an EF of 35-40% along with moderate LVH and trivial Benjamin.   Has not been admitted or been in the ED in the last 6 months.    He presents today for a follow-up visit with a chief complaint of minimal shortness of breath upon moderate exertion. He describes this as chronic in nature having been present for several years. He has associated fatigue, dizziness and difficulty sleeping (due to working night shift) along with this. He denies any abdominal distention, palpitations, pedal edema, chest pain, cough or weight gain.   Does work night shift from 4pm-4am. Endorses waking up feeling just as tired as when he went to sleep. Says that when he gets home from work, he will sleep for a couple of hours, get up and take his kids to school and then may sleep another couple of hours so only gets ~ 4-5 hours of sleep a day.   Past Medical History:  Diagnosis Date  . CHF (congestive heart failure) (HCC)   . Hypertension    Past Surgical History:  Procedure Laterality Date  . TONSILLECTOMY     No family history on file. Social History   Tobacco Use  . Smoking status: Never Smoker  . Smokeless tobacco: Never Used  Substance Use Topics  . Alcohol use: No   No Known Allergies  Prior to Admission medications   Medication Sig Start Date End Date Taking? Authorizing Provider  acetaminophen (TYLENOL) 325 MG tablet Take 650 mg by mouth every 6 (six) hours as needed for mild pain or headache.   Yes [provider]  aspirin 325 MG tablet Take 325 mg by mouth daily.    Yes [provider]  cloNIDine (CATAPRES) 0.2 MG tablet Take 0.2 mg by mouth 2 (two) times daily.   Yes [provider]  furosemide (LASIX) 40 MG tablet Take 1 tablet (40 mg total) by mouth daily.  09/16/19 03/09/20 Yes Tyrone Nine, MD  hydrALAZINE (APRESOLINE) 50 MG tablet Take 1 tablet (50 mg total) by mouth 3 (three) times daily. 09/16/19  Yes Tyrone Nine, MD  isosorbide mononitrate (IMDUR) 30 MG 24 hr tablet Take 30 mg by mouth daily.   Yes [provider]  metoprolol (TOPROL XL) 200 MG 24 hr tablet Take 1 tablet (200 mg total) by mouth daily. 10/16/20  Yes Jonda Alanis, Jarold Song, FNP  Multiple Vitamins-Minerals (MULTIVITAMIN MEN PO) Take 1 tablet by mouth daily.   Yes [provider]  potassium chloride 20 MEQ TBCR Take 20 mEq by mouth 2 (two) times daily. Patient taking differently: Take 20 mEq by mouth daily. 09/16/19  Yes Tyrone Nine, MD  sacubitril-valsartan (ENTRESTO) 97-103 MG Take 1 tablet by mouth 2 (two) times daily. 04/10/20  Yes Oracio Galen, Inetta Fermo A, FNP  sertraline (ZOLOFT) 100 MG tablet Take 100 mg by mouth daily.   Yes [provider]  spironolactone (ALDACTONE) 25 MG tablet Take 1 tablet (25 mg total) by mouth daily. 10/16/20 01/14/21 Yes Delma Freeze, FNP    Review of Systems  Constitutional: Positive for fatigue. Negative for appetite change.  HENT: Negative for congestion, postnasal drip and sore throat.   Eyes: Negative.   Respiratory: Positive for shortness of breath ("very little").  Negative for cough and chest tightness.   Cardiovascular: Negative for chest pain, palpitations and leg swelling.  Gastrointestinal: Negative for abdominal distention and abdominal pain.  Endocrine: Negative.   Genitourinary: Negative.   Musculoskeletal: Negative for back pain and neck pain.  Skin: Negative.   Allergic/Immunologic: Negative.   Neurological: Positive for dizziness and headaches (at times). Negative for light-headedness.  Hematological: Negative for adenopathy. Does not bruise/bleed easily.  Psychiatric/Behavioral: Positive for sleep disturbance (sleeping on 2 pillows). Negative for dysphoric mood. The patient is not nervous/anxious.    Vitals:    11/02/20 0926 11/02/20 0946  BP: (!) 148/95 130/80  Pulse: 60   Resp: 20   SpO2: 99%   Weight: 268 lb 8 oz (121.8 kg)   Height: 5\' 8"  (1.727 m)    Wt Readings from Last 3 Encounters:  11/02/20 268 lb 8 oz (121.8 kg)  10/16/20 269 lb 8 oz (122.2 kg)  07/18/20 277 lb 2 oz (125.7 kg)   Lab Results  Component Value Date   CREATININE 1.23 11/02/2020   CREATININE 1.02 10/16/2020   CREATININE 1.09 04/10/2020    Physical Exam Vitals and nursing note reviewed.  Constitutional:      Appearance: Normal appearance.  HENT:     Head: Normocephalic and atraumatic.  Cardiovascular:     Rate and Rhythm: Normal rate and regular rhythm.  Pulmonary:     Effort: Pulmonary effort is normal. No respiratory distress.     Breath sounds: No wheezing or rales.  Abdominal:     General: There is no distension.     Palpations: Abdomen is soft.  Musculoskeletal:        General: No tenderness.     Cervical back: Normal range of motion and neck supple.     Right lower leg: No edema.     Left lower leg: No edema.  Skin:    General: Skin is warm and dry.  Neurological:     General: No focal deficit present.     Mental Status: He is alert and oriented to person, place, and time.  Psychiatric:        Mood and Affect: Mood normal.        Behavior: Behavior normal.        Thought Content: Thought content normal.     Assessment & Plan:  1: Chronic heart failure with reduced ejection fraction- - NYHA class II - euvolemic today - weighing daily; reminded to call for an overnight weight gain of >2 pounds or a weekly weight gain of >5 pounds - weight down 1 pound from last visit here 2 weeks ago - walking on treadmill daily for ~ 20 minutes - not adding salt and has been reading food labels for sodium content; reviewed the importance of closely following a 2000mg  sodium diet  - saw cardiology ([name]) 09/14/20 - on GDMT of entresto, metoprolol and spironolactone - check BMP today due to adding  spironolactone at last visit - consider SGLT2 in future; consider changing isosorbide/hydralazine to bidil - quite active at work - BNP 09/15/19 was 316.0 - PharmD reconciled medications with the patient  2: HTN- - BP mildly elevated today - home BP readings range from 177/144 (before he takes meds) to 126/64 (after takes meds) - will increase hydralazine to 100mg  AM, 50mg  lunch, 50mg  PM - reports feeling drowsy with taking clonidine - saw PCP 11/14/20) 09/26/20 - BMP 10/16/20 reviewed and showed sodium 140, potassium 3.9, creatinine 1.21 and GFR 91 - saw  nephrology Cherylann Ratel) 10/16/20; returns in a couple of weeks  3: Severe obstructive sleep apnea- - has been told that he snores and that he has woken himself up snorting/ gasping - does work 4pm-4am  - waiting on updated CPAP equipment.    Medication bottles reviewed.   Return in 6 weeks or sooner for any questions/problems before then.

## 2020-11-02 NOTE — Progress Notes (Signed)
[facility] - PHARMACIST COUNSELING NOTE  Guideline-Directed Medical Therapy/Evidence Based Medicine  ACE/ARB/ARNI:   Sacubitril-valsartan 97-103 mg twice daily Beta Blocker: Metoprolol succinate 200 mg daily Aldosterone Antagonist: Spironolactone 25 mg daily Diuretic: Furosemide 40 mg daily SGLT2i: None  Adherence Assessment  Do you ever forget to take your medication? [] Yes [x] No  Do you ever skip doses due to side effects? [] Yes [x] No  Do you have trouble affording your medicines? [] Yes [x] No  Are you ever unable to pick up your medication due to transportation difficulties? [] Yes [x] No  Do you ever stop taking your medications because you don't believe they are helping? [] Yes [x] No   Adherence strategy: Pillbox; pt denies any barriers to obtaining medications    Vital signs: HR 60, BP 148/95, weight (pounds) 268 ECHO: Date 09/14/2019, EF 35-40%, notes LV global hypokinesis  BMP Latest Ref Rng & Units 10/16/2020 04/10/2020 01/03/2020  Glucose 70 - 99 mg/dL 118(H) 110(H) 100(H)  BUN 6 - 20 mg/dL 11 12 16   Creatinine 0.61 - 1.24 mg/dL 1.02 1.09 1.21  Sodium 135 - 145 mmol/L 137 139 140  Potassium 3.5 - 5.1 mmol/L 3.4(L) 3.6 3.8  Chloride 98 - 111 mmol/L 104 104 104  CO2 22 - 32 mmol/L 25 26 28   Calcium 8.9 - 10.3 mg/dL 8.7(L) 8.7(L) 9.0    Past Medical History:  Diagnosis Date  . CHF (congestive heart failure) ([facility])   . Hypertension     ASSESSMENT 35 year old male who presents to the HF clinic for a follow up visit. Pt reports one episode of hypotension last week where he felt lightheaded. Pt also reports frequent (every other day) headaches/migraines which are usually relieved by acetaminophen. Pt works night shift and does not get much sleep during the day time due to other responsibilities.   Recent ED Visit (past 6 months): Date N/A  PLAN CHF/HTN -continue furosemide 40 mg daily, Entresto 97-103 mg BID, continue  metoprolol succinate 200 mg daily  -change hydralazine to 100 mg QAM and 50 mg Qafternoon and 50 mg QPM (total daily dose of 200 mg) -continue spironolactone 25 mg daily; labs to be re-checked to assess K and renal function -goal is to eventually taper pt off of clonidine -consider addition of SGLT2i at later date; however focus would be to get pt's BP under control  Chest pain -continue isosorbide mononitrate 30 mg daily -change aspirin 325 mg to aspirin 81 mg daily   Pain/headaches -continue acetaminophen use as needed -avoid use of NSAIDs  Mood - Continue sertraline 100 mg daily   Time spent: 10 minutes  Sherilyn Banker, PharmD Pharmacy Resident  11/02/2020 10:12 AM    Current Outpatient Medications:  .  acetaminophen (TYLENOL) 325 MG tablet, Take 650 mg by mouth every 6 (six) hours as needed for mild pain or headache., Disp: , Rfl:  .  aspirin 325 MG tablet, Take 325 mg by mouth daily. , Disp: , Rfl:  .  cloNIDine (CATAPRES) 0.2 MG tablet, Take 0.2 mg by mouth 2 (two) times daily., Disp: , Rfl:  .  furosemide (LASIX) 40 MG tablet, Take 1 tablet (40 mg total) by mouth daily., Disp: 30 tablet, Rfl: 0 .  hydrALAZINE (APRESOLINE) 50 MG tablet, Take 1 tablet (50 mg total) by mouth 3 (three) times daily. (Patient taking differently: Take 50 mg by mouth 3 (three) times daily. 100mg  AM/ 50mg  lunch and bedtime), Disp: 90 tablet, Rfl: 0 .  isosorbide mononitrate (IMDUR)  30 MG 24 hr tablet, Take 30 mg by mouth daily., Disp: , Rfl:  .  metoprolol (TOPROL XL) 200 MG 24 hr tablet, Take 1 tablet (200 mg total) by mouth daily., Disp: 90 tablet, Rfl: 3 .  Multiple Vitamins-Minerals (MULTIVITAMIN MEN PO), Take 1 tablet by mouth daily., Disp: , Rfl:  .  potassium chloride 20 MEQ TBCR, Take 20 mEq by mouth 2 (two) times daily. (Patient taking differently: Take 20 mEq by mouth daily.), Disp: 60 tablet, Rfl: 0 .  sacubitril-valsartan (ENTRESTO) 97-103 MG, Take 1 tablet by mouth 2 (two) times  daily., Disp: 180 tablet, Rfl: 3 .  sertraline (ZOLOFT) 100 MG tablet, Take 100 mg by mouth daily., Disp: , Rfl:  .  spironolactone (ALDACTONE) 25 MG tablet, Take 1 tablet (25 mg total) by mouth daily., Disp: 90 tablet, Rfl: 3   COUNSELING POINTS/CLINICAL PEARLS  Metoprolol succinate (Goal: 200 mg daily)  Patient should avoid activities requiring mental alertness or coordination until drug effects are realized, as drug may cause dizziness.  This drug may cause bradyarrhythmia, diarrhea, nausea, vomiting, arthralgia, back pain, myalgia, headache, vision disorder, erectile dysfunction, reduced libido, or fatigue.  Instruct patient to report signs/symptoms of adverse cardiovascular effects such as hypotension (especially in elderly patients), arrhythmias, syncope, palpitations, angina, or edema.  Drug may mask symptoms of hypoglycemia. Advise diabetic patients to carefully monitor blood sugar levels.  Patient should take drug with food.  Advise patient against sudden discontinuation of drug. Instruct patient to take a missed dose as soon as possible, but if next dose is in less than 8 h, skip the missed dose.  Tell patient planning major surgery with anesthesia to alert physician that drug is being used, as drug impairs ability of heart to respond to reflex adrenergic stimuli.  Patient should take extended-release tablet with or immediately following meals. Sacubitril-valsartan (Goal: 97-103 mg twice daily)  Warn male patient to avoid pregnancy during therapy and to report a pregnancy to a physician.  Advise patient to report symptomatic hypotension.  Side effects may include hyperkalemia, cough, dizziness, or renal failure. Furosemide  Drug causes sun-sensitivity. Advise patient to use sunscreen and avoid tanning beds. Patient should avoid activities requiring coordination until drug effects are realized, as drug may cause dizziness, vertigo, or blurred vision. This drug may cause excessive  urination, hyperglycemia, hyperuricemia, constipation, diarrhea, loss of appetite, nausea, vomiting, purpuric disorder, cramps, spasticity, asthenia, headache, paresthesia, or scaling eczema. Instruct patient to report unusual bleeding/bruising or signs/symptoms of hypotension, infection, pancreatitis, or ototoxicity (tinnitus, hearing impairment). Advise patient to report signs/symptoms of a severe skin reactions (flu-like symptoms, spreading red rash, or skin/mucous membrane blistering) or erythema multiforme. Instruct patient to eat high-potassium foods during drug therapy, as directed by healthcare professional.  Patient should not drink alcohol while taking this drug. Warn patient to avoid use of nonprescription NSAID products without first discussing it with their healthcare provider. Spironolactone (Goal: 25-50 mg daily)  Warn patient to report dehydration, hypotension, or symptoms of worsening renal function.  Counsel male patient to report gynecomastia.  Side effects may include diarrhea, nausea, vomiting, abdominal cramping, fever, leg cramps, lethargy, mental confusion, decreased libido, irregular menses, and rash. Suspension: Tell patient to take drug consistently with respect to food, either before or after a meal.  Advise patient to avoid potassium supplements and foods containing high levels of potassium, including salt substitutes.  DRUGS TO CAUTION IN HEART FAILURE  Drug or Class Mechanism  Analgesics . NSAIDs . COX-2 inhibitors . Glucocorticoids  Sodium and water retention, increased systemic vascular resistance, decreased response to diuretics   Diabetes Medications . Metformin . Thiazolidinediones o Rosiglitazone (Avandia) o Pioglitazone (Actos) . DPP4 Inhibitors o Saxagliptin (Onglyza) o Sitagliptin (Januvia)   Lactic acidosis Possible calcium channel blockade   Unknown  Antiarrhythmics . Class I  o Flecainide o Disopyramide . Class  III o Sotalol . Other o Dronedarone  Negative inotrope, proarrhythmic   Proarrhythmic, beta blockade  Negative inotrope  Antihypertensives . Alpha Blockers o Doxazosin . Calcium Channel Blockers o Diltiazem o Verapamil o Nifedipine . Central Alpha Adrenergics o Moxonidine . Peripheral Vasodilators o Minoxidil  Increases renin and aldosterone  Negative inotrope    Possible sympathetic withdrawal  Unknown  Anti-infective . Itraconazole . Amphotericin B  Negative inotrope Unknown  Hematologic . Anagrelide . Cilostazol   Possible inhibition of PD IV Inhibition of PD III causing arrhythmias  Neurologic/Psychiatric . Stimulants . Anti-Seizure Drugs o Carbamazepine o Pregabalin . Antidepressants o Tricyclics o Citalopram . Parkinsons o Bromocriptine o Pergolide o Pramipexole . Antipsychotics o Clozapine . Antimigraine o Ergotamine o Methysergide . Appetite suppressants . Bipolar o Lithium  Peripheral alpha and beta agonist activity  Negative inotrope and chronotrope Calcium channel blockade  Negative inotrope, proarrhythmic Dose-dependent QT prolongation  Excessive serotonin activity/valvular damage Excessive serotonin activity/valvular damage Unknown  IgE mediated hypersensitivy, calcium channel blockade  Excessive serotonin activity/valvular damage Excessive serotonin activity/valvular damage Valvular damage  Direct myofibrillar degeneration, adrenergic stimulation  Antimalarials . Chloroquine . Hydroxychloroquine Intracellular inhibition of lysosomal enzymes  Urologic Agents . Alpha Blockers o Doxazosin o Prazosin o Tamsulosin o Terazosin  Increased renin and aldosterone  Adapted from Page RL, et al. "Drugs That May Cause or Exacerbate Heart Failure: A Scientific Statement from the [place]." Circulation 2016; 671:I45-Y09. DOI: 10.1161/CIR.0000000000000426   MEDICATION ADHERENCES TIPS AND STRATEGIES 1. Taking  medication as prescribed improves patient outcomes in heart failure (reduces hospitalizations, improves symptoms, increases survival) 2. Side effects of medications can be managed by decreasing doses, switching agents, stopping drugs, or adding additional therapy. Please let someone in the [facility] know if you have having bothersome side effects so we can modify your regimen. Do not alter your medication regimen without talking to [place].  3. Medication reminders can help patients remember to take drugs on time. If you are missing or forgetting doses you can try linking behaviors, using pill boxes, or an electronic reminder like an alarm on your phone or an app. Some people can also get automated phone calls as medication reminders.

## 2020-11-03 ENCOUNTER — Encounter: Payer: Self-pay | Admitting: Family

## 2020-12-14 ENCOUNTER — Ambulatory Visit: Payer: BC Managed Care – PPO | Admitting: Family

## 2020-12-14 ENCOUNTER — Telehealth: Payer: Self-pay | Admitting: Family

## 2020-12-14 NOTE — Telephone Encounter (Signed)
Patient did not show for his [facility] appointment on 12/14/20. Will attempt to reschedule.

## 2020-12-19 ENCOUNTER — Other Ambulatory Visit (INDEPENDENT_AMBULATORY_CARE_PROVIDER_SITE_OTHER): Payer: Self-pay | Admitting: Nurse Practitioner

## 2020-12-19 DIAGNOSIS — I10 Essential (primary) hypertension: Secondary | ICD-10-CM

## 2020-12-20 ENCOUNTER — Ambulatory Visit (INDEPENDENT_AMBULATORY_CARE_PROVIDER_SITE_OTHER): Payer: BC Managed Care – PPO

## 2020-12-20 ENCOUNTER — Encounter (INDEPENDENT_AMBULATORY_CARE_PROVIDER_SITE_OTHER): Payer: Self-pay | Admitting: Nurse Practitioner

## 2020-12-20 ENCOUNTER — Ambulatory Visit (INDEPENDENT_AMBULATORY_CARE_PROVIDER_SITE_OTHER): Payer: BC Managed Care – PPO | Admitting: Nurse Practitioner

## 2020-12-20 ENCOUNTER — Other Ambulatory Visit: Payer: Self-pay

## 2020-12-20 VITALS — BP 187/121 | HR 60 | Resp 16 | Ht 68.0 in | Wt 261.0 lb

## 2020-12-20 DIAGNOSIS — I10 Essential (primary) hypertension: Secondary | ICD-10-CM

## 2020-12-20 DIAGNOSIS — M26629 Arthralgia of temporomandibular joint, unspecified side: Secondary | ICD-10-CM | POA: Insufficient documentation

## 2020-12-24 ENCOUNTER — Encounter (INDEPENDENT_AMBULATORY_CARE_PROVIDER_SITE_OTHER): Payer: Self-pay | Admitting: Nurse Practitioner

## 2020-12-24 NOTE — Progress Notes (Signed)
Subjective:    Patient ID: Benjamin Goodman., male    DOB: [birth year], 35 y.o.   MRN: [id number] Chief Complaint  Patient presents with   New Patient (Initial Visit)    Ref Cherylann Ratel renal consult    Dilon Lank is a 35 year old male that presents today for evaluation for hypertension.  The patient has had hypertension for years since he was a teenager and it has been difficult to control.  Patient's medications are maxed out however despite this he still continues to have issue with blood pressure control.  The patient notes that there was some concern for possible tumor on his kidneys.  He denies any fever or chills.  He denies any chest pain or shortness of breath.  Today noninvasive studies show normal size kidneys bilaterally.  No evidence of renal artery stenosis bilaterally.  No obvious structural abnormalities noted.   Review of Systems  All other systems reviewed and are negative.     Objective:   Physical Exam Vitals reviewed.  Constitutional:      Appearance: He is obese.  HENT:     Head: Normocephalic.  Cardiovascular:     Rate and Rhythm: Normal rate.  Pulmonary:     Effort: Pulmonary effort is normal.  Neurological:     Mental Status: He is alert. He is disoriented.  Psychiatric:        Mood and Affect: Mood normal.        Behavior: Behavior normal.        Thought Content: Thought content normal.        Judgment: Judgment normal.    BP (!) 187/121 (BP Location: Left Arm)   Pulse 60   Resp 16   Ht 5\' 8"  (1.727 m)   Wt 261 lb (118.4 kg)   BMI 39.68 kg/m   Past Medical History:  Diagnosis Date   CHF (congestive heart failure) (HCC)    Hypertension     Social History   Socioeconomic History   Marital status: Single    Spouse name: Not on file   Number of children: Not on file   Years of education: Not on file   Highest education level: Not on file  Occupational History   Not on file  Tobacco Use   Smoking status: Never   Smokeless  tobacco: Never  Substance and Sexual Activity   Alcohol use: No   Drug use: No   Sexual activity: Yes  Other Topics Concern   Not on file  Social History Narrative   Not on file   Social Determinants of Health   Financial Resource Strain: Not on file  Food Insecurity: Not on file  Transportation Needs: Not on file  Physical Activity: Not on file  Stress: Not on file  Social Connections: Not on file  Intimate Partner Violence: Not on file    Past Surgical History:  Procedure Laterality Date   TONSILLECTOMY      Family History  Problem Relation Age of Onset   Asthma Mother    Cancer Mother    Diabetes Mother    Hyperlipidemia Mother    Hypertension Mother    Hypertension Father    Heart attack Maternal Uncle     No Known Allergies  CBC Latest Ref Rng & Units 09/16/2019 09/13/2019 01/25/2017  WBC 4.0 - 10.5 K/uL 9.5 8.6 12.4(H)  Hemoglobin 13.0 - 17.0 g/dL 01/27/2017 31.4 12.9(L)  Hematocrit 39.0 - 52.0 % 47.4 46.1 38.1(L)  Platelets 150 -  400 K/uL 211 191 181      CMP     Component Value Date/Time   NA 137 11/02/2020 1026   NA 141 01/18/2014 1615   K 3.9 11/02/2020 1026   K 3.7 01/18/2014 1615   CL 102 11/02/2020 1026   CL 106 01/18/2014 1615   CO2 25 11/02/2020 1026   CO2 26 01/18/2014 1615   GLUCOSE 122 (H) 11/02/2020 1026   GLUCOSE 86 01/18/2014 1615   BUN 15 11/02/2020 1026   BUN 12 01/18/2014 1615   CREATININE 1.23 11/02/2020 1026   CREATININE 1.19 01/18/2014 1615   CALCIUM 8.9 11/02/2020 1026   CALCIUM 8.7 01/18/2014 1615   PROT 8.2 (H) 03/09/2015 1122   ALBUMIN 4.4 03/09/2015 1122   AST 43 (H) 03/09/2015 1122   ALT 84 (H) 03/09/2015 1122   ALKPHOS 49 03/09/2015 1122   BILITOT 0.7 03/09/2015 1122   GFRNONAA >60 11/02/2020 1026   GFRNONAA >60 01/18/2014 1615   GFRAA >60 01/03/2020 0931   GFRAA >60 01/18/2014 1615     No results found.     Assessment & Plan:   1. Essential hypertension Today the patient's noninvasive studies did not show  any level of renal artery stenosis.  The patient has normal kidney size with no evidence of cystic or other structures noted.  This No invasive studies or intervention is indicated at this time.  The patient will continue the current antihypertensive medications, no changes at this time.  The primary medical service will continue aggressive antihypertensive therapy as per the [facility] guidelines    Patient will follow up on an as-needed basis. Current Outpatient Medications on File Prior to Visit  Medication Sig Dispense Refill   acetaminophen (TYLENOL) 325 MG tablet Take 650 mg by mouth every 6 (six) hours as needed for mild pain or headache.     aspirin 325 MG tablet Take 325 mg by mouth daily.      cloNIDine (CATAPRES) 0.2 MG tablet Take 0.2 mg by mouth 2 (two) times daily.     hydrALAZINE (APRESOLINE) 50 MG tablet Take 1 tablet (50 mg total) by mouth 3 (three) times daily. (Patient taking differently: Take 50 mg by mouth 3 (three) times daily. 100mg  AM/ 50mg  lunch and bedtime) 90 tablet 0   isosorbide mononitrate (IMDUR) 30 MG 24 hr tablet Take 30 mg by mouth daily.     metoprolol (TOPROL XL) 200 MG 24 hr tablet Take 1 tablet (200 mg total) by mouth daily. 90 tablet 3   Multiple Vitamins-Minerals (MULTIVITAMIN MEN PO) Take 1 tablet by mouth daily.     potassium chloride 20 MEQ TBCR Take 20 mEq by mouth 2 (two) times daily. (Patient taking differently: Take 20 mEq by mouth daily.) 60 tablet 0   sacubitril-valsartan (ENTRESTO) 97-103 MG Take 1 tablet by mouth 2 (two) times daily. 180 tablet 3   sertraline (ZOLOFT) 100 MG tablet Take 100 mg by mouth daily.     spironolactone (ALDACTONE) 25 MG tablet Take 1 tablet (25 mg total) by mouth daily. 90 tablet 3   furosemide (LASIX) 40 MG tablet Take 1 tablet (40 mg total) by mouth daily. 30 tablet 0   No current facility-administered medications on file prior to visit.    There are no Patient Instructions on file for this visit. No follow-ups on  file.   , NP

## 2021-01-02 NOTE — Progress Notes (Deleted)
   Patient ID: Benjamin Gilmer., male    DOB: January 14, 1986, 35 y.o.   MRN: [id number]  HPI  Benjamin Goodman is a 35 y/o male with a history of HTN and chronic heart failure.  Echo report from 09/14/19 reviewed and showed an EF of 35-40% along with moderate LVH and trivial Benjamin.   Has not been admitted or been in the ED in the last 6 months.    He presents today for a follow-up visit with a chief complaint of   Does work night shift from 4pm-4am. Endorses waking up feeling just as tired as when he went to sleep. Says that when he gets home from work, he will sleep for a couple of hours, get up and take his kids to school and then may sleep another couple of hours so only gets ~ 4-5 hours of sleep a day.   Past Medical History:  Diagnosis Date   CHF (congestive heart failure) (HCC)    Hypertension    Past Surgical History:  Procedure Laterality Date   TONSILLECTOMY     Family History  Problem Relation Age of Onset   Asthma Mother    Cancer Mother    Diabetes Mother    Hyperlipidemia Mother    Hypertension Mother    Hypertension Father    Heart attack Maternal Uncle    Social History   Tobacco Use   Smoking status: Never   Smokeless tobacco: Never  Substance Use Topics   Alcohol use: No   No Known Allergies    Review of Systems  Neurological:  Positive for dizziness.     Physical Exam  Assessment & Plan:  1: Chronic heart failure with reduced ejection fraction- - NYHA class II - euvolemic today - weighing daily; reminded to call for an overnight weight gain of >2 pounds or a weekly weight gain of >5 pounds - weight 268.8 pound from last visit here 2 months ago - walking on treadmill daily for ~ 20 minutes - not adding salt and has been reading food labels for sodium content; reviewed the importance of closely following a 2000mg  sodium diet  - saw cardiology ([name]) 12/14/20 - on GDMT of entresto, metoprolol and spironolactone  - consider SGLT2 in future; consider  changing isosorbide/hydralazine to bidil - quite active at work - BNP 09/15/19 was 316.0  2: HTN- - BP  -  - will increase hydralazine to 100mg  AM, 50mg  lunch, 50mg  PM - reports feeling drowsy with taking clonidine - saw PCP 11/15/19) 09/26/20 - BMP 11/02/20 reviewed and showed sodium 137, potassium 3.9, creatinine 1.23 and GFR >60 - saw nephrology ) 11/14/20  3: Severe obstructive sleep apnea- - has been told that he snores and that he has woken himself up snorting/ gasping - does work 4pm-4am  - waiting on updated CPAP equipment.    Medication bottles reviewed.

## 2021-01-03 ENCOUNTER — Ambulatory Visit: Payer: BC Managed Care – PPO | Admitting: Family

## 2021-01-22 ENCOUNTER — Ambulatory Visit: Payer: BC Managed Care – PPO | Attending: Family | Admitting: Family

## 2021-01-22 ENCOUNTER — Encounter: Payer: Self-pay | Admitting: Family

## 2021-01-22 ENCOUNTER — Other Ambulatory Visit: Payer: Self-pay

## 2021-01-22 VITALS — BP 120/67 | HR 73 | Resp 20 | Ht 68.0 in | Wt 263.4 lb

## 2021-01-22 DIAGNOSIS — R0683 Snoring: Secondary | ICD-10-CM | POA: Insufficient documentation

## 2021-01-22 DIAGNOSIS — I110 Hypertensive heart disease with heart failure: Secondary | ICD-10-CM | POA: Insufficient documentation

## 2021-01-22 DIAGNOSIS — Z79899 Other long term (current) drug therapy: Secondary | ICD-10-CM | POA: Insufficient documentation

## 2021-01-22 DIAGNOSIS — Z7901 Long term (current) use of anticoagulants: Secondary | ICD-10-CM | POA: Insufficient documentation

## 2021-01-22 DIAGNOSIS — I5022 Chronic systolic (congestive) heart failure: Secondary | ICD-10-CM | POA: Diagnosis not present

## 2021-01-22 DIAGNOSIS — G4733 Obstructive sleep apnea (adult) (pediatric): Secondary | ICD-10-CM

## 2021-01-22 DIAGNOSIS — I10 Essential (primary) hypertension: Secondary | ICD-10-CM

## 2021-01-22 DIAGNOSIS — R0602 Shortness of breath: Secondary | ICD-10-CM | POA: Insufficient documentation

## 2021-01-22 DIAGNOSIS — Z7982 Long term (current) use of aspirin: Secondary | ICD-10-CM | POA: Insufficient documentation

## 2021-01-22 DIAGNOSIS — Z8249 Family history of ischemic heart disease and other diseases of the circulatory system: Secondary | ICD-10-CM | POA: Diagnosis not present

## 2021-01-22 NOTE — Patient Instructions (Addendum)
Continue weighing daily and call for an overnight weight gain of > 2 pounds or a weekly weight gain of >5 pounds.    Decrease clonidine to 1 tablet daily for 2 weeks, then reduce it to 1 tablet every other day for 2 weeks and then stop it.   Check blood pressure every other day and if it is over 150/80, give me a call.

## 2021-01-22 NOTE — Progress Notes (Signed)
Patient ID: Benjamin Goodman., male    DOB: 07/19/1985, 35 y.o.   MRN: [id number]    Mr Murad is a 35 y/o male with a history of HTN and chronic heart failure.  Echo report from 09/14/19 reviewed and showed an EF of 35-40% along with moderate LVH and trivial MR.   Has not been admitted or been in the ED in the last 6 months.    He presents today for a follow-up visit with a chief complaint of minimal shortness of breath upon moderate exertion. He describes this as chronic in nature having been present for several years. He has associated fatigue, dizziness, headaches & difficulty sleeping along with this. He denies any abdominal distention, palpitations, pedal edema, chest pain, cough or weight gain.   Does work night shift from 4pm-4am. Endorses waking up feeling just as tired as when he went to sleep. Says that when he gets home from work, he will sleep for a couple of hours at a time but that his mind has been racing lately making it difficult to get much sleep.   Past Medical History:  Diagnosis Date   CHF (congestive heart failure) (HCC)    Hypertension    Past Surgical History:  Procedure Laterality Date   TONSILLECTOMY     Family History  Problem Relation Age of Onset   Asthma Mother    Cancer Mother    Diabetes Mother    Hyperlipidemia Mother    Hypertension Mother    Hypertension Father    Heart attack Maternal Uncle    Social History   Tobacco Use   Smoking status: Never   Smokeless tobacco: Never  Substance Use Topics   Alcohol use: No   No Known Allergies  Prior to Admission medications   Medication Sig Start Date End Date Taking? Authorizing Provider  acetaminophen (TYLENOL) 325 MG tablet Take 650 mg by mouth every 6 (six) hours as needed for mild pain or headache.   Yes [provider]  aspirin 325 MG tablet Take 325 mg by mouth daily.    Yes [provider]  cloNIDine (CATAPRES) 0.2 MG tablet Take 0.2 mg by mouth 2 (two) times daily.    Yes [provider]  furosemide (LASIX) 40 MG tablet Take 1 tablet (40 mg total) by mouth daily. 09/16/19  Yes Tyrone Nine, MD  hydrALAZINE (APRESOLINE) 50 MG tablet Take 1 tablet (50 mg total) by mouth 3 (three) times daily. Patient taking differently: Take 50 mg by mouth 3 (three) times daily. 100mg  AM/ 50mg  lunch and bedtime 09/16/19  Yes , MD  isosorbide mononitrate (IMDUR) 30 MG 24 hr tablet Take 30 mg by mouth daily.   Yes [provider]  metoprolol (TOPROL XL) 200 MG 24 hr tablet Take 1 tablet (200 mg total) by mouth daily. 10/16/20  Yes Adyline Huberty, Tyrone Nine, FNP  Multiple Vitamins-Minerals (MULTIVITAMIN MEN PO) Take 1 tablet by mouth daily.   Yes [provider]  potassium chloride 20 MEQ TBCR Take 20 mEq by mouth 2 (two) times daily. Patient taking differently: Take 20 mEq by mouth daily. 09/16/19  Yes Jarold Song, MD  sacubitril-valsartan (ENTRESTO) 97-103 MG Take 1 tablet by mouth 2 (two) times daily. 04/10/20  Yes Joua Bake, Tyrone Nine A, FNP  sertraline (ZOLOFT) 100 MG tablet Take 100 mg by mouth daily.   Yes [provider]  spironolactone (ALDACTONE) 25 MG tablet Take 1 tablet (25 mg total) by mouth daily.  10/16/20  Yes Delma Freeze, FNP   Review of Systems  Constitutional:  Positive for fatigue.  Respiratory:  Positive for shortness of breath. Negative for cough.   Cardiovascular:  Negative for chest pain, palpitations and leg swelling.  Gastrointestinal:  Negative for abdominal distention and abdominal pain.  Endocrine: Negative.   Genitourinary: Negative.   Musculoskeletal:  Negative for back pain and neck pain.  Skin: Negative.   Allergic/Immunologic: Negative.   Neurological:  Positive for dizziness and headaches. Negative for light-headedness.  Hematological:  Negative for adenopathy. Does not bruise/bleed easily.  Psychiatric/Behavioral:  Positive for sleep disturbance (not sleeping well; mind racing).    Vitals:   01/22/21 0834   BP: 120/67  Pulse: 73  Resp: 20  SpO2: 99%  Weight: 263 lb 6 oz (119.5 kg)  Height: 5\' 8"  (1.727 m)   Wt Readings from Last 3 Encounters:  01/22/21 263 lb 6 oz (119.5 kg)  12/20/20 261 lb (118.4 kg)  11/02/20 268 lb 8 oz (121.8 kg)   Lab Results  Component Value Date   CREATININE 1.23 11/02/2020   CREATININE 1.02 10/16/2020   CREATININE 1.09 04/10/2020    Physical Exam Vitals and nursing note reviewed.  Constitutional:      Appearance: Normal appearance.  HENT:     Head: Normocephalic and atraumatic.  Cardiovascular:     Rate and Rhythm: Normal rate and regular rhythm.  Pulmonary:     Effort: Pulmonary effort is normal. No respiratory distress.     Breath sounds: No wheezing or rales.  Abdominal:     General: There is no distension.     Palpations: Abdomen is soft.     Tenderness: There is no abdominal tenderness.  Musculoskeletal:        General: No tenderness.     Cervical back: Normal range of motion and neck supple.     Right lower leg: No edema.     Left lower leg: No edema.  Skin:    General: Skin is warm and dry.  Neurological:     General: No focal deficit present.     Mental Status: He is alert and oriented to person, place, and time.  Psychiatric:        Mood and Affect: Mood normal.        Behavior: Behavior normal.        Thought Content: Thought content normal.    Assessment & Plan:  1: Chronic heart failure with reduced ejection fraction- - NYHA class II - euvolemic today - weighing daily; reminded to call for an overnight weight gain of >2 pounds or a weekly weight gain of >5 pounds - weight down 5 pounds from last visit here 2 months ago - walking on treadmill daily for ~ 20 minutes - not adding salt and has been reading food labels for sodium content; reviewed the importance of closely following a 2000mg  sodium diet  - saw cardiology ([name]) 12/14/20 - on GDMT of entresto, metoprolol and spironolactone - discussed adding SGLT2 in  future; consider changing isosorbide/hydralazine to bidil - quite active at work - BNP 09/15/19 was 316.0  2: HTN- - BP looks good today - will start to see if we can wean down clonidine; he will decrease dose to 1 tablet daily for 2 weeks and then 1 tablet every other day for 2 weeks and then stop it - he's to check his BP every other day and if it goes >150/80, to contact 02/14/21 - saw PCP (  [facility]) 09/26/20 - BMP 11/02/20 reviewed and showed sodium 137, potassium 3.9, creatinine 1.23 and GFR >60 - saw nephrology Cherylann Ratel) 11/14/20  3: Severe obstructive sleep apnea- - has been told that he snores and that he has woken himself up snorting/ gasping - does work 4pm-4am  - waiting on updated CPAP equipment; this could be contributing to his headaches   Medication bottles reviewed.   Return in 6 weeks or sooner for any questions/problems before then.

## 2021-03-05 ENCOUNTER — Ambulatory Visit: Payer: BC Managed Care – PPO | Attending: Family | Admitting: Family

## 2021-03-05 ENCOUNTER — Encounter: Payer: Self-pay | Admitting: Family

## 2021-03-05 ENCOUNTER — Other Ambulatory Visit: Payer: Self-pay

## 2021-03-05 VITALS — BP 140/99 | HR 56 | Resp 18 | Ht 68.0 in | Wt 252.0 lb

## 2021-03-05 DIAGNOSIS — R45851 Suicidal ideations: Secondary | ICD-10-CM | POA: Diagnosis not present

## 2021-03-05 DIAGNOSIS — I5022 Chronic systolic (congestive) heart failure: Secondary | ICD-10-CM | POA: Diagnosis not present

## 2021-03-05 DIAGNOSIS — I10 Essential (primary) hypertension: Secondary | ICD-10-CM

## 2021-03-05 DIAGNOSIS — G4733 Obstructive sleep apnea (adult) (pediatric): Secondary | ICD-10-CM | POA: Diagnosis not present

## 2021-03-05 DIAGNOSIS — I110 Hypertensive heart disease with heart failure: Secondary | ICD-10-CM | POA: Diagnosis not present

## 2021-03-05 DIAGNOSIS — Z79899 Other long term (current) drug therapy: Secondary | ICD-10-CM | POA: Diagnosis not present

## 2021-03-05 DIAGNOSIS — F32A Depression, unspecified: Secondary | ICD-10-CM | POA: Insufficient documentation

## 2021-03-05 DIAGNOSIS — F329 Major depressive disorder, single episode, unspecified: Secondary | ICD-10-CM | POA: Diagnosis not present

## 2021-03-05 DIAGNOSIS — Z8249 Family history of ischemic heart disease and other diseases of the circulatory system: Secondary | ICD-10-CM | POA: Diagnosis not present

## 2021-03-05 MED ORDER — HYDRALAZINE HCL 50 MG PO TABS: 50.0000 mg | ORAL_TABLET | Freq: Three times a day (TID) | ORAL | 3 refills | Status: DC

## 2021-03-05 NOTE — Patient Instructions (Addendum)
Continue weighing daily and call for an overnight weight gain of > 2 pounds or a weekly weight gain of >5 pounds.    [facility] [address] [place] [address]  Stop taking metoprolol tartrate (medication with X on it)   Go by [facility] today after you wake up!  [facility]  [address]  [place] [address] [facility]: [phone]  Evaluations WALK IN ONLY  Monday, Wednesday, Friday    8:30 am -3 pm    You are doing a great job going to the gym, keep this up!

## 2021-03-05 NOTE — Progress Notes (Signed)
Patient ID: Benjamin Goodman., male    DOB: 03/20/1986, 35 y.o.   MRN: [id number]   Benjamin Goodman is a 35 y/o male with a history of HTN, sleep apnea, depression and chronic heart failure.  Echo report from 09/14/19 reviewed and showed an EF of 35-40% along with moderate LVH and trivial Benjamin.   Has not been admitted or been in the ED in the last 6 months.    He presents today for a follow-up visit with a chief complaint of minimal fatigue upon moderate exertion. He describes this as chronic in nature having been present for several years. He has associated shortness of breath, headaches, weight loss and depression along with this. He denies any dizziness, abdominal distention, palpitations, pedal edema, chest pain, cough or weight gain.   Does work night shift from 4pm-4am. Endorses waking up feeling just as tired as when he went to sleep. Says that as he was weaning down from his clonidine, his BP was rising into the 200's so he resumed taking it. He said he was confused with his medications so he continued to take metoprolol tartrate 100mg  BID in addition to the metoprolol succinate 200mg  daily. Has been out of hydralazine as well.   Reports worsening depression as he found his SO in their bed with another man. He has been with his SO for 14 years and they have a son together. SO has moved out and son has stayed with him. He has been going to the gym 3-4 hours/ day 4-5 days / week to work off steam. Does admit to having suicidal thoughts occasionally but denies any actual plan. He says that he wouldn't kill himself because he has his son to live for. Has been taking double the sertraline because he says that is what his previous PCP told him to do. This PCP has now released him from the practice.   Past Medical History:  Diagnosis Date   CHF (congestive heart failure) (HCC)    Hypertension    Past Surgical History:  Procedure Laterality Date   TONSILLECTOMY     Family History  Problem Relation  Age of Onset   Asthma Mother    Cancer Mother    Diabetes Mother    Hyperlipidemia Mother    Hypertension Mother    Hypertension Father    Heart attack Maternal Uncle    Social History   Tobacco Use   Smoking status: Never   Smokeless tobacco: Never  Substance Use Topics   Alcohol use: No   No Known Allergies  Prior to Admission medications   Medication Sig Start Date End Date Taking? Authorizing Provider  acetaminophen (TYLENOL) 325 MG tablet Take 650 mg by mouth every 6 (six) hours as needed for mild pain or headache.   Yes [provider]  aspirin 81 MG chewable tablet Chew 81 mg by mouth daily.   Yes [provider]  cloNIDine (CATAPRES) 0.1 MG tablet Take 0.1 mg by mouth 2 (two) times daily.   Yes [provider]  cloNIDine (CATAPRES) 0.2 MG tablet Take 0.2 mg by mouth daily.   Yes [provider]  isosorbide mononitrate (IMDUR) 30 MG 24 hr tablet Take 30 mg by mouth daily.   Yes [provider]  metoprolol (TOPROL XL) 200 MG 24 hr tablet Take 1 tablet (200 mg total) by mouth daily. 10/16/20  Yes A, FNP  metoprolol tartrate (LOPRESSOR) 100 MG tablet Take 100 mg by mouth 2 (  two) times daily.   Yes [provider]  Multiple Vitamins-Minerals (MULTIVITAMIN MEN PO) Take 1 tablet by mouth daily.   Yes [provider]  potassium chloride 20 MEQ TBCR Take 20 mEq by mouth 2 (two) times daily. Patient taking differently: Take 20 mEq by mouth daily. 09/16/19  Yes Tyrone Nine, MD  sacubitril-valsartan (ENTRESTO) 97-103 MG Take 1 tablet by mouth 2 (two) times daily. 04/10/20  Yes Terry Bolotin, Inetta Fermo A, FNP  sertraline (ZOLOFT) 100 MG tablet Take 100 mg by mouth daily.   Yes [provider]  spironolactone (ALDACTONE) 25 MG tablet Take 1 tablet (25 mg total) by mouth daily. 10/16/20  Yes Clarisa Kindred A, FNP  aspirin 325 MG tablet Take 325 mg by mouth daily.  Patient not taking: Reported on 03/05/2021    [provider]  furosemide (LASIX) 40 MG tablet Take 1 tablet (40 mg total) by mouth daily. Patient not taking: Reported on 03/05/2021 09/16/19   Tyrone Nine, MD  hydrALAZINE (APRESOLINE) 50 MG tablet Take 1 tablet (50 mg total) by mouth 3 (three) times daily. 03/05/21   Delma Freeze, FNP   Review of Systems  Constitutional:  Positive for appetite change (decreased) and fatigue (minimal).  HENT:  Negative for congestion, postnasal drip and sore throat.   Eyes: Negative.   Respiratory:  Positive for shortness of breath. Negative for cough.   Cardiovascular:  Negative for chest pain, palpitations and leg swelling.  Gastrointestinal:  Negative for abdominal distention and abdominal pain.  Endocrine: Negative.   Genitourinary: Negative.   Musculoskeletal:  Negative for back pain and neck pain.  Skin: Negative.   Allergic/Immunologic: Negative.   Neurological:  Positive for headaches. Negative for dizziness and light-headedness.  Hematological:  Negative for adenopathy. Does not bruise/bleed easily.  Psychiatric/Behavioral:  Positive for dysphoric mood, sleep disturbance (not sleeping well; mind racing) and suicidal ideas ("no plan"). Negative for agitation and self-injury.    Vitals:   03/05/21 0830  BP: (!) 140/99  Pulse: (!) 56  Resp: 18  SpO2: 98%  Weight: 252 lb (114.3 kg)  Height: 5\' 8"  (1.727 m)   Wt Readings from Last 3 Encounters:  03/05/21 252 lb (114.3 kg)  01/22/21 263 lb 6 oz (119.5 kg)  12/20/20 261 lb (118.4 kg)   Lab Results  Component Value Date   CREATININE 1.23 11/02/2020   CREATININE 1.02 10/16/2020   CREATININE 1.09 04/10/2020    Physical Exam Vitals and nursing note reviewed.  Constitutional:      Appearance: Normal appearance.  HENT:     Head: Normocephalic and atraumatic.  Cardiovascular:     Rate and Rhythm: Regular rhythm. Bradycardia present.  Pulmonary:     Effort: Pulmonary effort is normal. No respiratory distress.     Breath sounds: No  wheezing or rales.  Abdominal:     General: There is no distension.     Palpations: Abdomen is soft.     Tenderness: There is no abdominal tenderness.  Musculoskeletal:        General: No tenderness.     Cervical back: Normal range of motion and neck supple.     Right lower leg: No edema.     Left lower leg: No edema.  Skin:    General: Skin is warm and dry.  Neurological:     General: No focal deficit present.     Mental Status: He is alert and oriented to person, place, and time.  Psychiatric:  Mood and Affect: Mood is depressed.        Behavior: Behavior normal.        Thought Content: Thought content normal.    Assessment & Plan:  1: Chronic heart failure with reduced ejection fraction- - NYHA class II - euvolemic today - weighing daily; reminded to call for an overnight weight gain of >2 pounds or a weekly weight gain of >5 pounds - weight down 11 pounds from last visit here 6 weeks ago - going to the gym numerous days / week for 3-4 hours at a time - not adding salt and has been reading food labels for sodium content; reviewed the importance of closely following a 2000mg  sodium diet  - saw cardiology ([name]) 12/14/20 - on GDMT of entresto, metoprolol and spironolactone - was taking both metoprolol tartrate and succinate; big X placed on tartrate and told him to not take that formulation anymore; patient bradycardic with worsening depression today - consider adding SGLT2 in future; consider changing isosorbide/hydralazine to bidil but will defer today due to his multiple medication confusions already - refilled hydralazine today - quite active at work - BNP 09/15/19 was 316.0  2: HTN- - BP mildly elevated - tried weaning off clonidine at home but BP got > 200 so he resumed it - saw PCP 11/15/19) 09/26/20; says that he needs a new PCP as he's been released from [facility] - BMP 11/02/20 reviewed and showed sodium 137, potassium 3.9, creatinine 1.23 and  GFR >60 - saw nephrology 11/04/20) 11/14/20  3: Severe obstructive sleep apnea- - has been told that he snores and that he has woken himself up snorting/ gasping - does work 4pm-4am  - wearing CPAP 3-4 days/ week; says that sometimes he goes home and falls asleep before putting it on  4: Depression- - admits to worsening depression as he found his SO in their bed with another man; SO has since moved out but they have been together 14 years and share a son - admits to suicidal thoughts but denies any actual plan and says that his son is what is keeping him here - now taking 200mg  sertraline because he says that is what his PCP recommended - has been working out at the gym "a lot" to work off steam - discussed [facility] and that they accept walk-ins and he contracts with me that he will go today after he goes home and gets some sleep (works night shift) - congratulated him on speaking up about this and emphasized that help is available; emphasized that should suicidal thoughts return that he can always go the ED for evaluation - will call patient tomorrow to check on him   Medication bottles reviewed.   Return in 1 week or sooner for any questions/problems before then.

## 2021-03-12 NOTE — Progress Notes (Signed)
Patient ID: Benjamin Popelka., male    DOB: July 06, 1985, 35 y.o.   MRN: [id number]   Benjamin Goodman is a 35 y/o male with a history of HTN, sleep apnea, depression and chronic heart failure.  Echo report from 09/14/19 reviewed and showed an EF of 35-40% along with moderate LVH and trivial Benjamin.   Has not been admitted or been in the ED in the last 6 months.    Benjamin Goodman presents today for a follow-up visit with a chief complaint of minimal fatigue upon moderate exertion. Benjamin Goodman describes this as chronic in nature having been present for several years. Benjamin Goodman has associated decreased appetite, depression, difficulty sleeping and intermittent chest pain along with this. Benjamin Goodman denies any suicidal ideation, dizziness, abdominal distention, palpitations, pedal edema, shortness of breath, cough or weight gain.   Has been out of his furosemide (since 03/01/21) and sertraline (since 02/22/21). Does have refills on both bottles. Benjamin Goodman said that yesterday at work, Benjamin Goodman had a coupe of episodes where Benjamin Goodman experienced chest pain and shortness of breath and then they quickly resolved. Benjamin Goodman said that one time Benjamin Goodman was bending over to pick up a box and the other time Benjamin Goodman was just walking.   Does work night shift from 4pm-4am although Benjamin Goodman admits that Benjamin Goodman hasn't been sleeping well. Continues to go to the gym.    Benjamin Goodman says that Benjamin Goodman went to [facility] last week but then got very nervous so ended up and walked out. Benjamin Goodman says that Benjamin Goodman and his SO are talking some as she seems to want to resume their relationship. Benjamin Goodman is now living in a barn without any electricity and she is living in their place with their son. Benjamin Goodman's hoping to have his own place soon. 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 Benjamin Goodman is unsure if this is temporary or a permanent job change but this possible job move is stressful for him as well.   Past Medical History:  Diagnosis Date   CHF (congestive heart failure) (HCC)    Hypertension    Sleep apnea     Past Surgical History:  Procedure Laterality Date   TONSILLECTOMY     Family History  Problem Relation Age of Onset   Asthma Mother    Cancer Mother    Diabetes Mother    Hyperlipidemia Mother    Hypertension Mother    Hypertension Father    Heart attack Maternal Uncle    Social History   Tobacco Use   Smoking status: Never   Smokeless tobacco: Never  Substance Use Topics   Alcohol use: No   No Known Allergies  Prior to Admission medications   Medication Sig Start Date End Date Taking? Authorizing Provider  acetaminophen (TYLENOL) 325 MG tablet Take 650 mg by mouth every 6 (six) hours as needed for mild pain or headache.   Yes [provider]  AMLODIPINE BESYLATE PO Take 10 mg by mouth daily.   Yes [provider]  aspirin 81 MG chewable tablet Chew 81 mg by mouth daily.   Yes [provider]  cloNIDine (CATAPRES) 0.1 MG tablet Take 0.1 mg by mouth 2 (two) times daily.   Yes [provider]  hydrALAZINE (APRESOLINE) 50 MG tablet Take 1 tablet (50 mg total) by mouth 3 (three) times daily. 03/05/21  Yes Clarisa Kindred A, FNP  isosorbide mononitrate (IMDUR) 30 MG 24 hr tablet Take  30 mg by mouth daily.   Yes [provider]  metoprolol (TOPROL XL) 200 MG 24 hr tablet Take 1 tablet (200 mg total) by mouth daily. 10/16/20  Yes Jewelz Kobus, Jarold Song, FNP  Multiple Vitamins-Minerals (MULTIVITAMIN MEN PO) Take 1 tablet by mouth daily.   Yes [provider]  potassium chloride 20 MEQ TBCR Take 20 mEq by mouth 2 (two) times daily. Patient taking differently: Take 20 mEq by mouth daily. 09/16/19  Yes Tyrone Nine, MD  sacubitril-valsartan (ENTRESTO) 97-103 MG Take 1 tablet by mouth 2 (two) times daily. 04/10/20  Yes Juhi Lagrange, Inetta Fermo A, FNP  spironolactone (ALDACTONE) 25 MG tablet Take 1 tablet (25 mg total) by mouth daily. 10/16/20  Yes Clarisa Kindred A, FNP  aspirin 325 MG tablet Take 325 mg by mouth daily.  Patient not taking: No sig reported     [provider]  furosemide (LASIX) 40 MG tablet Take 1 tablet (40 mg total) by mouth daily. Patient not taking: No sig reported 09/16/19   Tyrone Nine, MD  sertraline (ZOLOFT) 100 MG tablet Take 100 mg by mouth daily. Patient not taking: Reported on 03/13/2021    [provider]   Review of Systems  Constitutional:  Positive for appetite change (decreased) and fatigue (minimal).  HENT:  Negative for congestion, postnasal drip and sore throat.   Eyes: Negative.   Respiratory:  Negative for cough and shortness of breath.   Cardiovascular:  Positive for chest pain (yesterday while at work). Negative for palpitations and leg swelling.  Gastrointestinal:  Negative for abdominal distention and abdominal pain.  Endocrine: Negative.   Genitourinary: Negative.   Musculoskeletal:  Negative for back pain and neck pain.  Skin: Negative.   Allergic/Immunologic: Negative.   Neurological:  Negative for dizziness, light-headedness and headaches.  Hematological:  Negative for adenopathy. Does not bruise/bleed easily.  Psychiatric/Behavioral:  Positive for dysphoric mood and sleep disturbance (not sleeping well; mind racing). Negative for agitation, self-injury and suicidal ideas.    Vitals:   03/13/21 0833  BP: 127/63  Pulse: 85  Resp: 14  SpO2: 97%  Weight: 254 lb 6 oz (115.4 kg)  Height: 5\' 8"  (1.727 m)   Wt Readings from Last 3 Encounters:  03/13/21 254 lb 6 oz (115.4 kg)  03/05/21 252 lb (114.3 kg)  01/22/21 263 lb 6 oz (119.5 kg)   Lab Results  Component Value Date   CREATININE 1.23 11/02/2020   CREATININE 1.02 10/16/2020   CREATININE 1.09 04/10/2020    Physical Exam Vitals and nursing note reviewed.  Constitutional:      Appearance: Normal appearance.  HENT:     Head: Normocephalic and atraumatic.  Cardiovascular:     Rate and Rhythm: Normal rate and regular rhythm.  Pulmonary:     Effort: Pulmonary effort is normal. No respiratory distress.     Breath  sounds: No wheezing or rales.  Abdominal:     General: There is no distension.     Palpations: Abdomen is soft.     Tenderness: There is no abdominal tenderness.  Musculoskeletal:        General: No tenderness.     Cervical back: Normal range of motion and neck supple.     Right lower leg: No edema.     Left lower leg: No edema.  Skin:    General: Skin is warm and dry.  Neurological:     General: No focal deficit present.     Mental Status: Benjamin Goodman is  alert and oriented to person, place, and time.  Psychiatric:        Mood and Affect: Mood is depressed.        Behavior: Behavior normal.        Thought Content: Thought content normal.    Assessment & Plan:  1: Chronic heart failure with reduced ejection fraction- - NYHA class II - euvolemic today - not weighing daily; encouraged to resume daily weight and reminded to call for an overnight weight gain of >2 pounds or a weekly weight gain of >5 pounds - weight up 2 pounds from last visit here 1 week ago - going to the gym numerous days / week for 3-4 hours at a time - not adding salt and has been reading food labels for sodium content; reviewed the importance of closely following a 2000mg  sodium diet  - saw cardiology ([name]) 12/14/20 - on GDMT of entresto, metoprolol and spironolactone - no longer bradycardic since Benjamin Goodman's only taking metoprolol succinate now (was previously taking succinate and tartrate formulations) - consider adding SGLT2 in future - quite active at work; did have episode of intermittent chest pain/ shortness of breath while at work yesterday which quickly resolved; admits that Benjamin Goodman hasn't been sleeping well at all and has been out of furosemide so advised him to get that refilled today; should it happen again, Benjamin Goodman needs to call his cardiologist - BNP 09/15/19 was 316.0  2: HTN- - BP looks good (127/63) - saw PCP 11/15/19) 09/26/20; has new PCP appointment scheduled on 04/26/21 at [facility] - BMP 11/02/20  reviewed and showed sodium 137, potassium 3.9, creatinine 1.23 and GFR >60 - saw nephrology 11/04/20) 11/14/20  3: Severe obstructive sleep apnea- - has been told that Benjamin Goodman snores and that Benjamin Goodman has woken himself up snorting/ gasping - does work 4pm-4am  - currently not wearing his CPAP as Benjamin Goodman's living in a barn without any electricity  4: Depression- - went to [facility] last week but then got nervous so Benjamin Goodman left - has been out of sertraline but does have refills so encouraged him to get that refilled - depression may have been made worse as Benjamin Goodman previously was taking 2 different versions of metoprolol - currently living in a barn without electricity but Benjamin Goodman's hoping to have his own place soon; SO is living in their place with their son - Benjamin Goodman's nervous about his work as Benjamin Goodman may have to go to [place] and then possibly to [place] and Benjamin Goodman doesn't know when or if this could be a permanent change - denies any suicidal thoughts   Medication bottles reviewed.   Return in 2 weeks or sooner for any questions/problems before then.

## 2021-03-13 ENCOUNTER — Encounter: Payer: Self-pay | Admitting: Family

## 2021-03-13 ENCOUNTER — Other Ambulatory Visit: Payer: Self-pay

## 2021-03-13 ENCOUNTER — Ambulatory Visit: Payer: BC Managed Care – PPO | Attending: Family | Admitting: Family

## 2021-03-13 VITALS — BP 127/63 | HR 85 | Resp 14 | Ht 68.0 in | Wt 254.4 lb

## 2021-03-13 DIAGNOSIS — Z91120 Patient's intentional underdosing of medication regimen due to financial hardship: Secondary | ICD-10-CM | POA: Diagnosis not present

## 2021-03-13 DIAGNOSIS — G4733 Obstructive sleep apnea (adult) (pediatric): Secondary | ICD-10-CM | POA: Diagnosis not present

## 2021-03-13 DIAGNOSIS — Z8249 Family history of ischemic heart disease and other diseases of the circulatory system: Secondary | ICD-10-CM | POA: Insufficient documentation

## 2021-03-13 DIAGNOSIS — I110 Hypertensive heart disease with heart failure: Secondary | ICD-10-CM | POA: Insufficient documentation

## 2021-03-13 DIAGNOSIS — Z79899 Other long term (current) drug therapy: Secondary | ICD-10-CM | POA: Diagnosis not present

## 2021-03-13 DIAGNOSIS — T501X6A Underdosing of loop [high-ceiling] diuretics, initial encounter: Secondary | ICD-10-CM | POA: Insufficient documentation

## 2021-03-13 DIAGNOSIS — F329 Major depressive disorder, single episode, unspecified: Secondary | ICD-10-CM

## 2021-03-13 DIAGNOSIS — Z591 Inadequate housing: Secondary | ICD-10-CM | POA: Diagnosis not present

## 2021-03-13 DIAGNOSIS — T43226A Underdosing of selective serotonin reuptake inhibitors, initial encounter: Secondary | ICD-10-CM | POA: Diagnosis not present

## 2021-03-13 DIAGNOSIS — I5022 Chronic systolic (congestive) heart failure: Secondary | ICD-10-CM | POA: Insufficient documentation

## 2021-03-13 DIAGNOSIS — I10 Essential (primary) hypertension: Secondary | ICD-10-CM | POA: Diagnosis not present

## 2021-03-13 DIAGNOSIS — Z7982 Long term (current) use of aspirin: Secondary | ICD-10-CM | POA: Insufficient documentation

## 2021-03-13 DIAGNOSIS — F32A Depression, unspecified: Secondary | ICD-10-CM | POA: Diagnosis not present

## 2021-03-13 NOTE — Patient Instructions (Addendum)
Continue weighing daily and call for an overnight weight gain of > 2 pounds or a weekly weight gain of >5 pounds.    Call cardiology office to find out when your next appointment is [phone]

## 2021-03-28 ENCOUNTER — Encounter: Payer: Self-pay | Admitting: Family

## 2021-03-28 ENCOUNTER — Other Ambulatory Visit: Payer: Self-pay

## 2021-03-28 ENCOUNTER — Ambulatory Visit: Payer: BC Managed Care – PPO | Attending: Family | Admitting: Family

## 2021-03-28 VITALS — BP 150/92 | HR 80 | Resp 20 | Ht 68.0 in | Wt 253.1 lb

## 2021-03-28 DIAGNOSIS — G4730 Sleep apnea, unspecified: Secondary | ICD-10-CM | POA: Insufficient documentation

## 2021-03-28 DIAGNOSIS — G4733 Obstructive sleep apnea (adult) (pediatric): Secondary | ICD-10-CM | POA: Diagnosis not present

## 2021-03-28 DIAGNOSIS — I110 Hypertensive heart disease with heart failure: Secondary | ICD-10-CM | POA: Insufficient documentation

## 2021-03-28 DIAGNOSIS — I5022 Chronic systolic (congestive) heart failure: Secondary | ICD-10-CM

## 2021-03-28 DIAGNOSIS — T43226A Underdosing of selective serotonin reuptake inhibitors, initial encounter: Secondary | ICD-10-CM | POA: Insufficient documentation

## 2021-03-28 DIAGNOSIS — F329 Major depressive disorder, single episode, unspecified: Secondary | ICD-10-CM

## 2021-03-28 DIAGNOSIS — F32A Depression, unspecified: Secondary | ICD-10-CM | POA: Insufficient documentation

## 2021-03-28 DIAGNOSIS — Z91138 Patient's unintentional underdosing of medication regimen for other reason: Secondary | ICD-10-CM | POA: Diagnosis not present

## 2021-03-28 DIAGNOSIS — I10 Essential (primary) hypertension: Secondary | ICD-10-CM | POA: Diagnosis not present

## 2021-03-28 DIAGNOSIS — Z79899 Other long term (current) drug therapy: Secondary | ICD-10-CM | POA: Insufficient documentation

## 2021-03-28 DIAGNOSIS — T463X6A Underdosing of coronary vasodilators, initial encounter: Secondary | ICD-10-CM | POA: Diagnosis not present

## 2021-03-28 DIAGNOSIS — Z8249 Family history of ischemic heart disease and other diseases of the circulatory system: Secondary | ICD-10-CM | POA: Diagnosis not present

## 2021-03-28 DIAGNOSIS — Z7982 Long term (current) use of aspirin: Secondary | ICD-10-CM | POA: Insufficient documentation

## 2021-03-28 DIAGNOSIS — T501X6A Underdosing of loop [high-ceiling] diuretics, initial encounter: Secondary | ICD-10-CM | POA: Diagnosis not present

## 2021-03-28 MED ORDER — FUROSEMIDE 40 MG PO TABS: 40.0000 mg | ORAL_TABLET | Freq: Every day | ORAL | 3 refills | Status: DC

## 2021-03-28 MED ORDER — ISOSORBIDE MONONITRATE ER 30 MG PO TB24: 30.0000 mg | ORAL_TABLET | Freq: Every day | ORAL | 3 refills | Status: DC

## 2021-03-28 MED ORDER — SERTRALINE HCL 100 MG PO TABS: 100.0000 mg | ORAL_TABLET | Freq: Every day | ORAL | 0 refills | Status: DC

## 2021-03-28 NOTE — Patient Instructions (Signed)
Continue to try to eat low sodium diet  Please pick up your sertraline, lasix and imdur today and resume as directed
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
## 2021-04-11 ENCOUNTER — Emergency Department: Payer: BC Managed Care – PPO

## 2021-04-11 ENCOUNTER — Other Ambulatory Visit: Payer: Self-pay

## 2021-04-11 ENCOUNTER — Emergency Department
Admission: EM | Admit: 2021-04-11 | Discharge: 2021-04-11 | Disposition: A | Discharge status: Home / Self Care | Payer: BC Managed Care – PPO | Attending: Emergency Medicine | Admitting: Emergency Medicine

## 2021-04-11 ENCOUNTER — Encounter: Payer: Self-pay | Admitting: Emergency Medicine

## 2021-04-11 DIAGNOSIS — J069 Acute upper respiratory infection, unspecified: Secondary | ICD-10-CM | POA: Diagnosis not present

## 2021-04-11 DIAGNOSIS — R0789 Other chest pain: Secondary | ICD-10-CM | POA: Diagnosis not present

## 2021-04-11 DIAGNOSIS — Z7982 Long term (current) use of aspirin: Secondary | ICD-10-CM | POA: Diagnosis not present

## 2021-04-11 DIAGNOSIS — Z79899 Other long term (current) drug therapy: Secondary | ICD-10-CM | POA: Insufficient documentation

## 2021-04-11 DIAGNOSIS — I110 Hypertensive heart disease with heart failure: Secondary | ICD-10-CM | POA: Insufficient documentation

## 2021-04-11 DIAGNOSIS — R0602 Shortness of breath: Secondary | ICD-10-CM | POA: Insufficient documentation

## 2021-04-11 DIAGNOSIS — R059 Cough, unspecified: Secondary | ICD-10-CM | POA: Diagnosis present

## 2021-04-11 DIAGNOSIS — I509 Heart failure, unspecified: Secondary | ICD-10-CM | POA: Diagnosis not present

## 2021-04-11 DIAGNOSIS — Z20822 Contact with and (suspected) exposure to covid-19: Secondary | ICD-10-CM | POA: Insufficient documentation

## 2021-04-11 LAB — COMPREHENSIVE METABOLIC PANEL WITH GFR
ALT: 18 U/L (ref 0–44)
AST: 16 U/L (ref 15–41)
Albumin: 4.1 g/dL (ref 3.5–5.0)
Alkaline Phosphatase: 43 U/L (ref 38–126)
Anion gap: 6 (ref 5–15)
BUN: 13 mg/dL (ref 6–20)
CO2: 29 mmol/L (ref 22–32)
Calcium: 8.7 mg/dL — ABNORMAL LOW (ref 8.9–10.3)
Chloride: 103 mmol/L (ref 98–111)
Creatinine, Ser: 1.17 mg/dL (ref 0.61–1.24)
GFR, Estimated: >60 mL/min
Glucose, Bld: 107 mg/dL — ABNORMAL HIGH (ref 70–99)
Potassium: 3.6 mmol/L (ref 3.5–5.1)
Sodium: 138 mmol/L (ref 135–145)
Total Bilirubin: 0.9 mg/dL (ref 0.3–1.2)
Total Protein: 8 g/dL (ref 6.5–8.1)

## 2021-04-11 LAB — CBC WITH DIFFERENTIAL/PLATELET
Abs Immature Granulocytes: 0.02 10*3/uL (ref 0.00–0.07)
Basophils Absolute: 0 10*3/uL (ref 0.0–0.1)
Basophils Relative: 0 %
Eosinophils Absolute: 0.4 10*3/uL (ref 0.0–0.5)
Eosinophils Relative: 5 %
HCT: 45.6 % (ref 39.0–52.0)
Hemoglobin: 15.1 g/dL (ref 13.0–17.0)
Immature Granulocytes: 0 %
Lymphocytes Relative: 28 %
Lymphs Abs: 2.2 10*3/uL (ref 0.7–4.0)
MCH: 28.9 pg (ref 26.0–34.0)
MCHC: 33.1 g/dL (ref 30.0–36.0)
MCV: 87.2 fL (ref 80.0–100.0)
Monocytes Absolute: 0.9 10*3/uL (ref 0.1–1.0)
Monocytes Relative: 12 %
Neutro Abs: 4.4 10*3/uL (ref 1.7–7.7)
Neutrophils Relative %: 55 %
Platelets: 214 10*3/uL (ref 150–400)
RBC: 5.23 MIL/uL (ref 4.22–5.81)
RDW: 14.6 % (ref 11.5–15.5)
WBC: 7.9 10*3/uL (ref 4.0–10.5)
nRBC: 0 % (ref 0.0–0.2)

## 2021-04-11 LAB — BRAIN NATRIURETIC PEPTIDE: B Natriuretic Peptide: 18.9 pg/mL (ref 0.0–100.0)

## 2021-04-11 LAB — TROPONIN I (HIGH SENSITIVITY)
Troponin I (High Sensitivity): 9 ng/L (ref <18)
Troponin I (High Sensitivity): 11 ng/L (ref <18)

## 2021-04-11 LAB — RESP PANEL BY RT-PCR (FLU A&B, COVID) ARPGX2
Influenza A by PCR: NEGATIVE
Influenza B by PCR: NEGATIVE
SARS Coronavirus 2 by RT PCR: NEGATIVE

## 2021-04-11 MED ORDER — AMOXICILLIN 875 MG PO TABS: 875.0000 mg | ORAL_TABLET | Freq: Two times a day (BID) | ORAL | 0 refills | Status: DC

## 2021-04-11 MED ORDER — BENZONATATE 100 MG PO CAPS: 100.0000 mg | ORAL_CAPSULE | Freq: Three times a day (TID) | ORAL | 0 refills | Status: DC | PRN

## 2021-04-11 NOTE — ED Provider Notes (Signed)
Emergency Medicine Provider Triage Evaluation Note  Eduardo Honor. , a 35 y.o. male  was evaluated in triage.  Pt complains of chest pain, sob, cough; h/o CHF.  Review of Systems  Positive: CP, sob, cough Negative: Nausea/vomiting  Physical Exam  BP (!) 154/99 (BP Location: Right Arm)   Pulse 76   Temp 98.5 F (36.9 C) (Oral)   Resp 18   Ht 5\' 8"  (1.727 m)   Wt 111.1 kg   SpO2 97%   BMI 37.25 kg/m  Gen:   Awake, no distress   Resp:  Normal effort  MSK:   Moves extremities without difficulty  Other:    Medical Decision Making  Medically screening exam initiated at 4:35 AM.  Appropriate orders placed.  . was informed that the remainder of the evaluation will be completed by another provider, this initial triage assessment does not replace that evaluation, and the importance of remaining in the ED until their evaluation is complete.  35 year old male with CHF presenting with cough, sob, cp. Will obtain cardiac panel, cxr, ekg. Patient will be evaluated by provider once he receives treatment room.   20, MD 04/11/21 [phone]

## 2021-04-11 NOTE — Discharge Instructions (Signed)
Follow-up with your regular doctor if not improving in 2 to 3 days.  Return emergency department for worsening.  Take your medications as prescribed.

## 2021-04-11 NOTE — ED Triage Notes (Addendum)
Patient ambulatory to triage with steady gait, without difficulty or distress noted; pt reports nonprod cough (initially yellow in color), nasal congestion since Sunday; st CP and SHOB with coughing

## 2021-04-11 NOTE — ED Provider Notes (Signed)
[facility] Emergency Department Provider Note  ____________________________________________   Event Date/Time   First MD Initiated Contact with Patient 04/11/21 0920     (approximate)  I have reviewed the triage vital signs and the nursing notes.   HISTORY  Chief Complaint Cough    HPI Benjamin Monte. is a 35 y.o. male presents emergency department complaining of chest pain, shortness of breath and cough.  History of CHF.  Some fever/chills.  States he was feeling bad on Sunday and could not go to work.  No swelling in the lower extremities.  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 Authorizing Provider  benzonatate (TESSALON PERLES) 100 MG capsule Take 1 capsule (100 mg total) by mouth 3 (three) times daily as needed for cough. 04/11/21 04/11/22 Yes Marny Smethers, Roselyn Bering, PA-C  acetaminophen (TYLENOL) 325 MG tablet Take 650 mg by mouth every 6 (six) hours as needed for mild pain or headache.    [provider]  AMLODIPINE BESYLATE PO Take 10 mg by mouth daily.    [provider]  amoxicillin (AMOXIL) 875 MG tablet Take 1 tablet (875 mg total) by mouth 2 (two) times daily. 04/11/21   Sherrie Mustache Roselyn Bering, PA-C  aspirin 325 MG tablet Take 325 mg by mouth daily.  Patient not taking: No sig reported     [provider]  aspirin 81 MG chewable tablet Chew 81 mg by mouth daily.    [provider]  cloNIDine (CATAPRES) 0.1 MG tablet Take 0.1 mg by mouth 2 (two) times daily.    [provider]  furosemide (LASIX) 40 MG tablet Take 1 tablet (40 mg total) by mouth daily. 03/28/21   Delma Freeze, FNP  hydrALAZINE (APRESOLINE) 50 MG tablet Take 1 tablet (50 mg total) by mouth 3 (three) times daily. 03/05/21   Delma Freeze, FNP  isosorbide mononitrate (IMDUR) 30 MG 24 hr tablet Take 1 tablet (30 mg total) by mouth daily. 03/28/21   Delma Freeze, FNP  metoprolol (TOPROL XL) 200 MG 24 hr tablet Take 1 tablet (200 mg total) by mouth daily. 10/16/20   Delma Freeze, FNP  Multiple Vitamins-Minerals (MULTIVITAMIN MEN PO) Take 1 tablet by mouth daily.    [provider]  potassium chloride 20 MEQ TBCR Take 20 mEq by mouth 2 (two) times daily. Patient taking differently: Take 20 mEq by mouth daily. 09/16/19   Tyrone Nine, MD  sacubitril-valsartan (ENTRESTO) 97-103 MG Take 1 tablet by mouth 2 (two) times daily. 04/10/20   Delma Freeze, FNP  sertraline (ZOLOFT) 100 MG tablet Take 1 tablet (100 mg total) by mouth daily. Future refills from PCP 03/28/21   Delma Freeze, FNP  spironolactone (ALDACTONE) 25 MG tablet Take 1 tablet (25 mg total) by mouth daily.  10/16/20   Delma Freeze, FNP    Allergies Patient has no known allergies.  Family History  Problem Relation Age of Onset   Asthma Mother    Cancer Mother    Diabetes Mother    Hyperlipidemia Mother    Hypertension Mother    Hypertension Father    Heart attack Maternal Uncle     Social History Social History   Tobacco Use   Smoking status: Never   Smokeless tobacco: Never  Vaping Use   Vaping Use: Never used  Substance Use Topics   Alcohol use: No   Drug use: No    Review of Systems  Constitutional: Const fever/chills Eyes: No visual changes. ENT: No sore throat. Respiratory: Positive  cough Cardiovascular: Positive chest pain Gastrointestinal: Denies abdominal pain Genitourinary: Negative for dysuria. Musculoskeletal: Negative for back pain. Skin: Negative for rash. Psychiatric: no mood changes,     ____________________________________________   PHYSICAL EXAM:  VITAL SIGNS: ED Triage Vitals  Enc Vitals Group     BP 04/11/21 0428 (!) 154/99     Pulse Rate 04/11/21 0428 76     Resp 04/11/21 0428 18     Temp 04/11/21 0428 98.5 F (36.9 C)     Temp Source 04/11/21 0428 Oral     SpO2 04/11/21 0428 97 %     Weight 04/11/21 0427 245 lb (111.1 kg)     Height 04/11/21 0427 5\' 8"  (1.727 m)     Head Circumference --      Peak Flow --      Pain Score 04/11/21 0431 7     Pain Loc --      Pain Edu? --      Excl. in GC? --     Constitutional: Alert and oriented. Well appearing and in no acute distress. Eyes: Conjunctivae are normal.  Head: Atraumatic. Nose: No congestion/rhinnorhea. Mouth/Throat: Mucous membranes are moist.   Neck:  supple no lymphadenopathy noted Cardiovascular: Normal rate, regular rhythm. Heart sounds are normal Respiratory: Normal respiratory effort.  No retractions, lungs c t a  GU: deferred Musculoskeletal: FROM all extremities, warm and well perfused Neurologic:  Normal speech and language.  Skin:  Skin is warm, dry and intact. No rash noted. Psychiatric: Mood and affect are normal. Speech and behavior are normal.  ____________________________________________   LABS (all labs ordered are listed, but only abnormal results are displayed)  Labs Reviewed  COMPREHENSIVE METABOLIC PANEL - Abnormal; Notable for the following components:      Result Value   Glucose, Bld 107 (*)    Calcium 8.7 (*)    All other components within normal limits  RESP PANEL BY RT-PCR (FLU A&B, COVID) ARPGX2  CBC WITH DIFFERENTIAL/PLATELET  BRAIN NATRIURETIC PEPTIDE  TROPONIN I (HIGH SENSITIVITY)  TROPONIN I (HIGH SENSITIVITY)    ____________________________________________   ____________________________________________  RADIOLOGY  Chest x-ray  ____________________________________________   PROCEDURES  Procedure(s) performed: No  Procedures    ____________________________________________   INITIAL IMPRESSION / ASSESSMENT AND PLAN / ED COURSE  Pertinent labs & imaging results that were available during my care of the patient were reviewed by me and considered in my medical decision making (see chart for details).   The patient is a 35 year old male presents with URI and chest pain.  See HPI.  Physical exam shows patient.  Stable.  DDx: COVID, influenza, PNA, CHF, MI  Labs are assuring, CBC, metabolic panel, BNP, troponin are all normal, will await second troponin and respiratory panel prior  to discharge.  Chest x-ray reviewed by me confirmed by radiology to be negative for CHF or any other acute abnormality.  EKG shows normal sinus rhythm, see physician read  Second troponin is normal.  Respiratory panel is negative  I did explain findings to the patient.  He was diagnosed with a URI.  Given a work note.  Given a prescription for amoxicillin and Tessalon Perles.  He is to follow-up with his regular doctor if not improved in 3 days.  Return emergency department if worsening.  Discharged in stable condition.      Benjamin Goodman. was evaluated in Emergency Department on 04/11/2021 for the symptoms described in the history of present illness. He was evaluated in the context of the global COVID-19 pandemic, which necessitated consideration that the patient might be at risk for infection with the SARS-CoV-2 virus that causes COVID-19. Institutional protocols and algorithms that pertain to the evaluation of patients at risk for COVID-19 are in a state of rapid change based on information released by regulatory bodies including the CDC and federal and state organizations. These policies and  algorithms were followed during the patient's care in the ED.    As part of my medical decision making, I reviewed the following data within the electronic MEDICAL RECORD NUMBER Nursing notes reviewed and incorporated, Labs reviewed , EKG interpreted NSR, Old chart reviewed, Radiograph reviewed , Notes from prior ED visits, and [place] Controlled Substance Database  ____________________________________________   FINAL CLINICAL IMPRESSION(S) / ED DIAGNOSES  Final diagnoses:  Chest wall pain  Acute upper respiratory infection      NEW MEDICATIONS STARTED DURING THIS VISIT:  Discharge Medication List as of 04/11/2021 10:48 AM     START taking these medications   Details  benzonatate (TESSALON PERLES) 100 MG capsule Take 1 capsule (100 mg total) by mouth 3 (three) times daily as needed for cough., Starting Wed 04/11/2021, Until Thu 04/11/2022 at 2359, Normal         Note:  This document was prepared using Dragon voice recognition software and may include unintentional dictation errors.    Faythe Ghee, PA-C 04/11/21 1507    Merwyn Katos, MD 04/11/21 [phone]

## 2021-04-25 NOTE — Progress Notes (Deleted)
There were no vitals taken for this visit.   Subjective:    Patient ID: Benjamin Fronczak., male    DOB: [birth year], 35 y.o.   MRN: [id number]  HPI: Benjamin Vanderheiden. is a 35 y.o. male  No chief complaint on file.  Patient presents to clinic to establish care with new PCP.  Patient reports a history of ***. Patient denies a history of: Hypertension, Elevated Cholesterol, Diabetes, Thyroid problems, Depression, Anxiety, Neurological problems, and Abdominal problems.   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   Resolved Ambulatory Problems    Diagnosis Date Noted   Hypertensive urgency 09/13/2019   Past Medical History:  Diagnosis Date   CHF (congestive heart failure) (HCC)    Hypertension    Sleep apnea    Past Surgical History:  Procedure Laterality Date   TONSILLECTOMY     Family History  Problem Relation Age of Onset   Asthma Mother    Cancer Mother    Diabetes Mother    Hyperlipidemia Mother    Hypertension Mother    Hypertension Father    Heart attack Maternal Uncle      Review of Systems  Per HPI unless specifically indicated above     Objective:    There were no vitals taken for this visit.  Wt Readings from Last 3 Encounters:  04/11/21 245 lb (111.1 kg)  03/28/21 253 lb 2 oz (114.8 kg)  03/13/21 254 lb 6 oz (115.4 kg)    Physical Exam  Results for orders placed or performed during the hospital encounter of 04/11/21  Resp Panel by RT-PCR (Flu A&B, Covid) Nasopharyngeal Swab   Specimen: Nasopharyngeal Swab; Nasopharyngeal(NP) swabs in vial transport medium  Result  Value Ref Range   SARS Coronavirus 2 by RT PCR NEGATIVE NEGATIVE   Influenza A by PCR NEGATIVE NEGATIVE   Influenza B by PCR NEGATIVE NEGATIVE  CBC with Differential  Result Value Ref Range   WBC 7.9 4.0 - 10.5 K/uL   RBC 5.23 4.22 - 5.81 MIL/uL   Hemoglobin 15.1 13.0 - 17.0 g/dL   HCT 40.9 81.1 - 91.4 %   MCV 87.2 80.0 - 100.0 fL   MCH 28.9 26.0 - 34.0 pg   MCHC 33.1 30.0 - 36.0 g/dL   RDW 78.2 95.6 - 21.3 %   Platelets 214 150 - 400 K/uL   nRBC 0.0 0.0 - 0.2 %   Neutrophils Relative % 55 %   Neutro Abs 4.4 1.7 - 7.7 K/uL   Lymphocytes Relative 28 %   Lymphs Abs 2.2 0.7 - 4.0 K/uL   Monocytes Relative 12 %   Monocytes Absolute 0.9 0.1 - 1.0 K/uL   Eosinophils Relative 5 %   Eosinophils Absolute 0.4 0.0 - 0.5 K/uL   Basophils Relative 0 %   Basophils Absolute 0.0 0.0 - 0.1 K/uL   Immature Granulocytes 0 %   Abs Immature Granulocytes 0.02 0.00 - 0.07 K/uL  Comprehensive metabolic panel  Result Value Ref Range   Sodium 138 135 - 145 mmol/L   Potassium 3.6 3.5 - 5.1 mmol/L   Chloride 103 98 - 111  mmol/L   CO2 29 22 - 32 mmol/L   Glucose, Bld 107 (H) 70 - 99 mg/dL   BUN 13 6 - 20 mg/dL   Creatinine, Ser 2.44 0.61 - 1.24 mg/dL   Calcium 8.7 (L) 8.9 - 10.3 mg/dL   Total Protein 8.0 6.5 - 8.1 g/dL   Albumin 4.1 3.5 - 5.0 g/dL   AST 16 15 - 41 U/L   ALT 18 0 - 44 U/L   Alkaline Phosphatase 43 38 - 126 U/L   Total Bilirubin 0.9 0.3 - 1.2 mg/dL   GFR, Estimated >62 >86 mL/min   Anion gap 6 5 - 15  Brain natriuretic peptide  Result Value Ref Range   B Natriuretic Peptide 18.9 0.0 - 100.0 pg/mL  Troponin I (High Sensitivity)  Result Value Ref Range   Troponin I (High Sensitivity) 9 <18 ng/L  Troponin I (High Sensitivity)  Result Value Ref Range   Troponin I (High Sensitivity) 11 <18 ng/L      Assessment & Plan:   Problem List Items Addressed This Visit       Cardiovascular and Mediastinum   Essential hypertension   Cardiomyopathy of undetermined type (HCC) -  Primary     Other   Obesity, Class III, BMI 40-49.9 (morbid obesity) (HCC)     Follow up plan: No follow-ups on file.

## 2021-04-26 ENCOUNTER — Ambulatory Visit: Payer: Self-pay | Admitting: Nurse Practitioner

## 2021-04-26 ENCOUNTER — Other Ambulatory Visit: Payer: Self-pay

## 2021-04-26 DIAGNOSIS — I10 Essential (primary) hypertension: Secondary | ICD-10-CM

## 2021-04-26 DIAGNOSIS — I429 Cardiomyopathy, unspecified: Secondary | ICD-10-CM

## 2021-04-30 ENCOUNTER — Encounter: Payer: Self-pay | Admitting: Family

## 2021-04-30 ENCOUNTER — Ambulatory Visit: Payer: BC Managed Care – PPO | Attending: Family | Admitting: Family

## 2021-04-30 ENCOUNTER — Telehealth: Payer: Self-pay | Admitting: Licensed Clinical Social Worker

## 2021-04-30 ENCOUNTER — Other Ambulatory Visit: Payer: Self-pay

## 2021-04-30 VITALS — BP 178/98 | HR 74 | Resp 20 | Ht 68.0 in | Wt 245.5 lb

## 2021-04-30 DIAGNOSIS — I110 Hypertensive heart disease with heart failure: Secondary | ICD-10-CM | POA: Diagnosis not present

## 2021-04-30 DIAGNOSIS — I10 Essential (primary) hypertension: Secondary | ICD-10-CM

## 2021-04-30 DIAGNOSIS — G4733 Obstructive sleep apnea (adult) (pediatric): Secondary | ICD-10-CM | POA: Insufficient documentation

## 2021-04-30 DIAGNOSIS — I5022 Chronic systolic (congestive) heart failure: Secondary | ICD-10-CM | POA: Insufficient documentation

## 2021-04-30 DIAGNOSIS — F329 Major depressive disorder, single episode, unspecified: Secondary | ICD-10-CM

## 2021-04-30 DIAGNOSIS — F32A Depression, unspecified: Secondary | ICD-10-CM | POA: Diagnosis not present

## 2021-04-30 NOTE — Patient Instructions (Signed)
Please clear out your voicemail, in anticipation of the social worker calling you  Be vigilant about taking your medications as directed to help get your BP in a better range  Please give the BP logs to the RN at work, ask her to check and record for you once/shift...bring this log back to [place] at next visit  Call Inetta Fermo if you are getting low on any meds!!  We will see you back in 1 month

## 2021-04-30 NOTE — Telephone Encounter (Signed)
CSW consulted to reach out to pt regarding concerns with current living situation- went straight to VM and unable to leave a message.  Sent text requesting return call when available.  Burna Sis, LCSW Clinical Social Worker [facility] Desk#: [phone] Cell#: [phone]

## 2021-04-30 NOTE — Progress Notes (Signed)
Patient ID: Benjamin Goodman., male   DOB: 13-May-1986, 35 y.o.   MRN: [id number] Communicated with Rosetta Posner, LCSW, with [facility], regarding the patient's living situation and current lack of heat and power and inability to use CPAP machine. Rosetta Posner stated they will reach out to patient and try to identify areas where available resources could help this patient.  Suanne Marker, RN [facility]

## 2021-04-30 NOTE — Progress Notes (Signed)
Patient ID: Benjamin Goodman., male    DOB: 1985-07-22, 35 y.o.   MRN: [id number]   [name] is a 35 y/o male with a history of HTN, sleep apnea, depression and chronic heart failure.  Echo report from 09/14/19 reviewed and showed an EF of 35-40% along with moderate LVH and trivial MR.   04/11/21 ED visit for chest wall pain. Diagnosed with URI and cough, given amoxicillin and tessalon pearls and d/c'd home.   He presents today for a follow-up visit with a chief complaint of minimal fatigue upon moderate exertion. He describes this as chronic in nature having been present for several years. He has associated decreased appetite, depression, difficulty sleeping, one day of dizziness and palpitations associated with forgetting his medications, along with this. He denies any CP, suicidal ideation, abdominal distention, pedal edema, shortness of breath, cough or weight gain.    His SDOH continue to be his main issue. He continues to be homeless and is still living in a barn. He has reached out to his parents, they offered him their couch to sleep on, but advise him they have several visitors during the day and it would be hard for him to sleep on their couch while working 3rd shift. He had an appt with a new PCP on 11/17 but when he arrived was advised he had to pay $100 up front prior to visit, which he could not afford. He scheduled a f/u appointment 07/2021. He has not followed up with [facility] (behavioral health services), although he drives by daily and has considered stopping by. Encouraged him to go, but he states varying shifts has made it difficult to find time. He does have EAP at work, however he states it requires him taking time off of work and he is not able to take time off right now. His PHQ-9 score is 15, GAD 7 16. He has been adherent with his sertraline and it has improved his PHQ-9 score from 21 to 15 today.  Collaborated with HF SW in [place], with permission from patient, to see what  resources may be available to him.  He is not currently weighing himself or wearing his CPAP due to living in a barn with no electricity.   Past Medical History:  Diagnosis Date   CHF (congestive heart failure) (HCC)    Hypertension    Sleep apnea    Past Surgical History:  Procedure Laterality Date   TONSILLECTOMY     Family History  Problem Relation Age of Onset   Asthma Mother    Cancer Mother    Diabetes Mother    Hyperlipidemia Mother    Hypertension Mother    Hypertension Father    Heart attack Maternal Uncle    Social History   Tobacco Use   Smoking status: Never   Smokeless tobacco: Never  Substance Use Topics   Alcohol use: No   No Known Allergies  Prior to Admission medications   Medication Sig Start Date End Date Taking? Authorizing Provider  acetaminophen (TYLENOL) 325 MG tablet Take 650 mg by mouth every 6 (six) hours as needed for mild pain or headache.   Yes [provider]  AMLODIPINE BESYLATE PO Take 10 mg by mouth daily.   Yes [provider]  aspirin 81 MG chewable tablet Chew 81 mg by mouth daily.   Yes [provider]  cloNIDine (CATAPRES) 0.1 MG tablet Take 0.1 mg by mouth 2 (two) times daily.   Yes [provider]  furosemide (LASIX) 40 MG tablet Take 1 tablet (40 mg total) by mouth daily. 03/28/21  Yes Clarisa Kindred A, FNP  hydrALAZINE (APRESOLINE) 50 MG tablet Take 1 tablet (50 mg total) by mouth 3 (three) times daily. 03/05/21  Yes Clarisa Kindred A, FNP  isosorbide mononitrate (IMDUR) 30 MG 24 hr tablet Take 1 tablet (30 mg total) by mouth daily. 03/28/21  Yes Clarisa Kindred A, FNP  metoprolol (TOPROL XL) 200 MG 24 hr tablet Take 1 tablet (200 mg total) by mouth daily. 10/16/20  Yes Hackney, Jarold Song, FNP  Multiple Vitamins-Minerals (MULTIVITAMIN MEN PO) Take 1 tablet by mouth daily.   Yes [provider]  potassium chloride 20 MEQ TBCR Take 20 mEq by mouth 2 (two) times daily. Patient taking differently:  Take 20 mEq by mouth daily. 09/16/19  Yes Tyrone Nine, MD  sacubitril-valsartan (ENTRESTO) 97-103 MG Take 1 tablet by mouth 2 (two) times daily. 04/10/20  Yes Clarisa Kindred A, FNP  sertraline (ZOLOFT) 100 MG tablet Take 1 tablet (100 mg total) by mouth daily. Future refills from PCP 03/28/21  Yes Clarisa Kindred A, FNP  spironolactone (ALDACTONE) 25 MG tablet Take 1 tablet (25 mg total) by mouth daily. 10/16/20  Yes Clarisa Kindred A, FNP  aspirin 325 MG tablet Take 325 mg by mouth daily.  Patient not taking: Reported on 03/05/2021    [provider]    Review of Systems  Constitutional:  Positive for appetite change (decreased) and fatigue (minimal).  HENT:  Negative for congestion, postnasal drip and sore throat.   Eyes: Negative.  Negative for visual disturbance.  Respiratory:  Positive for cough (recent URI, still productive but improving). Negative for shortness of breath and wheezing.   Cardiovascular:  Negative for chest pain, palpitations and leg swelling.  Gastrointestinal:  Negative for abdominal distention and abdominal pain.  Endocrine: Negative.   Genitourinary: Negative.   Musculoskeletal:  Negative for back pain and neck pain.  Skin: Negative.   Allergic/Immunologic: Negative.   Neurological:  Negative for dizziness, syncope, light-headedness (x 1 day) and headaches.  Hematological:  Negative for adenopathy. Does not bruise/bleed easily.  Psychiatric/Behavioral:  Positive for dysphoric mood and sleep disturbance (no power to use CPAP, sleeping 1-2 hours/night). Negative for agitation, self-injury and suicidal ideas. The patient is not nervous/anxious.    Vitals:   04/30/21 0834 04/30/21 0839  BP: (!) 173/108 (!) 178/98  Pulse: 74   Resp: 20   SpO2: 100%   Weight: 245 lb 8 oz (111.4 kg)   Height: 5\' 8"  (1.727 m)    Wt Readings from Last 3 Encounters:  04/30/21 245 lb 8 oz (111.4 kg)  04/11/21 245 lb (111.1 kg)  03/28/21 253 lb 2 oz (114.8 kg)    Lab Results   Component Value Date   CREATININE 1.17 04/11/2021   CREATININE 1.23 11/02/2020   CREATININE 1.02 10/16/2020    Physical Exam Vitals and nursing note reviewed.  Constitutional:      Appearance: Normal appearance.  HENT:     Head: Normocephalic and atraumatic.  Cardiovascular:     Rate and Rhythm: Normal rate and regular rhythm.     Pulses: Normal pulses.     Heart sounds: Normal heart sounds. No murmur heard.   No gallop.  Pulmonary:     Effort: Pulmonary effort is normal. No respiratory distress.     Breath sounds: No wheezing or rales.  Abdominal:     General: There is no distension.  Palpations: Abdomen is soft.     Tenderness: There is no abdominal tenderness.  Musculoskeletal:        General: No tenderness.     Cervical back: Normal range of motion and neck supple.     Right lower leg: No edema.     Left lower leg: No edema.  Skin:    General: Skin is warm and dry.  Neurological:     General: No focal deficit present.     Mental Status: He is alert and oriented to person, place, and time.  Psychiatric:        Mood and Affect: Mood is depressed.        Behavior: Behavior normal.        Thought Content: Thought content normal.    Assessment & Plan:  1: Chronic heart failure with reduced ejection fraction- - NYHA class II - euvolemic today - not weighing daily; encouraged to resume daily weight and reminded to call for an overnight weight gain of >2 pounds or a weekly weight gain of >5 pounds - weight down 8 lbs since last visit, due to decreased appetite - due to his current living arrangement, he is eating mostly fast food or prepackaged meals. Encouraged to pick the healthiest, low sodium options feasible.  - saw cardiology ([name]) 12/14/20 - on GDMT of entresto, metoprolol and spironolactone - would like to start SGLT-2, however his SDOH are not permitting starting a new medication at this visit, will consider at next appt - discussed with Rosetta Posner SW  at [facility] HF clinic to see if there are any resources that may help him  - BNP 04/11/21 was 18.9  2: HTN- - BP today 178/103, took medications one hour prior to arrival. Re-checked 178/98 manual - missed Saturday and Sunday dose of lasix - missed today's am dose of clonidine - reports one day of dizziness and palpitations, went to RN at work and his BP was 130/70 and his heart rate was "low" but could not remember what it was  - had new PCP appointment scheduled on 04/26/21 at [facility], however when he arrived he did not have the monies for co-pay and he rescheduled for 07/2021 - provided him with a blank BP log for him to give his occupational health RN at work and for them to check his BP once/day and record to see if his elevated BP today is due to missing doses of medications or a true need for change in BP therapy - strongly advised him to take all of his medications as prescribed and not to miss any doses - BMP 04/11/21 reviewed and showed sodium 138, potassium 3.6, creatinine 1.17 and GFR >60 - saw nephrology Cherylann Ratel) 11/14/20  3: Severe obstructive sleep apnea- - has been told that he snores and that he has woken himself up snorting/ gasping - does work 4pm-4am  - currently not wearing his CPAP due to no electricity   4: Depression- - has EAP at work, but requires he take off and he is unable to take off at this time - encouraged to share feelings with family for support - has been taking his sertraline - PHQ-9 21 last visit, today 15 - advised him that until he is established with new PCP that we can refill his medications  - denies any suicidal thoughts  Medication bottles reviewed.   Return in one month.  He is aware to call/return in the interim for increased or worsening HF s/s.

## 2021-05-01 ENCOUNTER — Telehealth (HOSPITAL_COMMUNITY): Payer: Self-pay | Admitting: Licensed Clinical Social Worker

## 2021-05-01 NOTE — Telephone Encounter (Signed)
CSW attempted to call pt to discuss housing concerns- unable to reach or leave VM  Will continue to attempt contact  Burna Sis, LCSW Clinical Social Worker [facility] Desk#: [phone] Cell#: [phone]

## 2021-05-10 ENCOUNTER — Telehealth: Payer: Self-pay | Admitting: Licensed Clinical Social Worker

## 2021-05-10 NOTE — Telephone Encounter (Signed)
CSW attempted to call pt to discuss housing concerns- unable to reach- left VM requesting return call  Burna Sis, LCSW Clinical Social Worker [facility] Desk#: [phone] Cell#: [phone]

## 2021-06-04 NOTE — Progress Notes (Deleted)
Patient ID: Benjamin Goodman., male    DOB: [birth year], 35 y.o.   MRN: [id number]   Mr Poupard is a 35 y/o male with a history of HTN, sleep apnea, depression and chronic heart failure.  Echo report from 09/14/19 reviewed and showed an EF of 35-40% along with moderate LVH and trivial MR.   04/11/21 ED visit for chest wall pain. Diagnosed with URI and cough, given amoxicillin and tessalon pearls and d/c'd home.   He presents today for a follow-up visit with a chief complaint of   His SDOH continue to be his main issue. He continues to be homeless and is still living in a barn. He has reached out to his parents, they offered him their couch to sleep on, but advise him they have several visitors during the day and it would be hard for him to sleep on their couch while working 3rd shift. He had an appt with a new PCP on 11/17 but when he arrived was advised he had to pay $100 up front prior to visit, which he could not afford. He scheduled a f/u appointment 07/2021. He has not followed up with [facility] (behavioral health services), although he drives by daily and has considered stopping by. Encouraged him to go, but he states varying shifts has made it difficult to find time. He does have EAP at work, however he states it requires him taking time off of work and he is not able to take time off right now.   He is not currently weighing himself or wearing his CPAP due to living in a barn with no electricity.   Past Medical History:  Diagnosis Date   CHF (congestive heart failure) (HCC)    Hypertension    Sleep apnea    Past Surgical History:  Procedure Laterality Date   TONSILLECTOMY     Family History  Problem Relation Age of Onset   Asthma Mother    Cancer Mother    Diabetes Mother    Hyperlipidemia Mother    Hypertension Mother    Hypertension Father    Heart attack Maternal Uncle    Social History   Tobacco Use   Smoking status: Never   Smokeless tobacco: Never  Substance Use Topics    Alcohol use: No   No Known Allergies    Review of Systems  Constitutional:  Positive for appetite change (decreased) and fatigue (minimal).  HENT:  Negative for congestion, postnasal drip and sore throat.   Eyes: Negative.  Negative for visual disturbance.  Respiratory:  Positive for cough (recent URI, still productive but improving). Negative for shortness of breath and wheezing.   Cardiovascular:  Negative for chest pain, palpitations and leg swelling.  Gastrointestinal:  Negative for abdominal distention and abdominal pain.  Endocrine: Negative.   Genitourinary: Negative.   Musculoskeletal:  Negative for back pain and neck pain.  Skin: Negative.   Allergic/Immunologic: Negative.   Neurological:  Negative for dizziness, syncope, light-headedness (x 1 day) and headaches.  Hematological:  Negative for adenopathy. Does not bruise/bleed easily.  Psychiatric/Behavioral:  Positive for dysphoric mood and sleep disturbance (no power to use CPAP, sleeping 1-2 hours/night). Negative for agitation, self-injury and suicidal ideas. The patient is not nervous/anxious.       Physical Exam Vitals and nursing note reviewed.  Constitutional:      Appearance: Normal appearance.  HENT:     Head: Normocephalic and atraumatic.  Cardiovascular:     Rate and Rhythm: Normal rate  and regular rhythm.     Pulses: Normal pulses.     Heart sounds: Normal heart sounds. No murmur heard.   No gallop.  Pulmonary:     Effort: Pulmonary effort is normal. No respiratory distress.     Breath sounds: No wheezing or rales.  Abdominal:     General: There is no distension.     Palpations: Abdomen is soft.     Tenderness: There is no abdominal tenderness.  Musculoskeletal:        General: No tenderness.     Cervical back: Normal range of motion and neck supple.     Right lower leg: No edema.     Left lower leg: No edema.  Skin:    General: Skin is warm and dry.  Neurological:     General: No focal  deficit present.     Mental Status: He is alert and oriented to person, place, and time.  Psychiatric:        Mood and Affect: Mood is depressed.        Behavior: Behavior normal.        Thought Content: Thought content normal.    Assessment & Plan:  1: Chronic heart failure with reduced ejection fraction- - NYHA class II - euvolemic today - not weighing daily; encouraged to resume daily weight and reminded to call for an overnight weight gain of >2 pounds or a weekly weight gain of >5 pounds - weight 245.8 lbs from last visit here 1 month ago - due to his current living arrangement, he is eating mostly fast food or prepackaged meals. Encouraged to pick the healthiest, low sodium options feasible.  - saw cardiology ([name]) 12/14/20 - on GDMT of entresto, metoprolol and spironolactone - would like to start SGLT-2, however his SDOH are not permitting starting a new medication at this visit, will consider at next appt - BNP 04/11/21 was 18.9  2: HTN- - BP  - has new PCP appointment scheduled on 07/19/21 at [facility] - BMP 04/11/21 reviewed and showed sodium 138, potassium 3.6, creatinine 1.17 and GFR >60 - saw nephrology Cherylann Ratel) 11/14/20  3: Severe obstructive sleep apnea- - has been told that he snores and that he has woken himself up snorting/ gasping - does work 4pm-4am  - currently not wearing his CPAP due to no electricity   4: Depression- - has EAP at work, but requires he take off and he is unable to take off at this time - encouraged to share feelings with family for support - has been taking his sertraline - PHQ-9 15 last visit, today  - denies any suicidal thoughts   Medication bottles reviewed.

## 2021-06-06 ENCOUNTER — Ambulatory Visit: Payer: BC Managed Care – PPO | Admitting: Family

## 2021-06-06 ENCOUNTER — Telehealth: Payer: Self-pay | Admitting: Family

## 2021-06-06 NOTE — Telephone Encounter (Signed)
Patient did not show for his Heart Failure Clinic appointment on 06/06/21. Will attempt to reschedule.

## 2021-07-19 ENCOUNTER — Ambulatory Visit: Payer: Self-pay | Admitting: Nurse Practitioner

## 2021-07-19 NOTE — Progress Notes (Deleted)
There were no vitals taken for this visit.   Subjective:    Patient ID: Benjamin Tagg., male    DOB: April 20, 1986, 35 y.o.   MRN: [id number]  HPI: Benjamin Borowiak. is a 36 y.o. male  No chief complaint on file.   Patient presents to clinic to establish care with new PCP.  Introduced to Publishing rights manager role and practice setting.  All questions answered.  Discussed provider/patient relationship and expectations.  Patient reports a history of ***. Patient denies a history of: Hypertension, Elevated Cholesterol, Diabetes, Thyroid problems, Depression, Anxiety, Neurological problems, and Abdominal problems.   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  Review of Systems  Per HPI unless specifically indicated above     Objective:    There were no vitals taken for this visit.  Wt Readings from Last 3 Encounters:  04/30/21 245 lb 8 oz (111.4 kg)  04/11/21 245 lb (111.1 kg)  03/28/21 253 lb 2 oz (114.8 kg)    Physical Exam  Results for orders placed or performed  during the hospital encounter of 04/11/21  Resp Panel by RT-PCR (Flu A&B, Covid) Nasopharyngeal Swab   Specimen: Nasopharyngeal Swab; Nasopharyngeal(NP) swabs in vial transport medium  Result Value Ref Range   SARS Coronavirus 2 by RT PCR NEGATIVE NEGATIVE   Influenza A by PCR NEGATIVE NEGATIVE   Influenza B by PCR NEGATIVE NEGATIVE  CBC with Differential  Result Value Ref Range   WBC 7.9 4.0 - 10.5 K/uL   RBC 5.23 4.22 - 5.81 MIL/uL   Hemoglobin 15.1 13.0 - 17.0 g/dL   HCT 35.4 65.6 - 81.2 %   MCV 87.2 80.0 - 100.0 fL   MCH 28.9 26.0 - 34.0 pg   MCHC 33.1 30.0 - 36.0 g/dL   RDW 75.1 70.0 - 17.4 %   Platelets 214 150 - 400 K/uL   nRBC 0.0 0.0 - 0.2 %   Neutrophils Relative % 55 %   Neutro Abs 4.4 1.7 - 7.7 K/uL   Lymphocytes Relative 28 %   Lymphs Abs 2.2 0.7 - 4.0 K/uL   Monocytes Relative 12 %   Monocytes Absolute 0.9 0.1 - 1.0 K/uL   Eosinophils Relative 5 %   Eosinophils Absolute 0.4 0.0 - 0.5 K/uL   Basophils Relative 0 %   Basophils Absolute 0.0 0.0 - 0.1 K/uL   Immature Granulocytes 0 %   Abs Immature Granulocytes 0.02 0.00 - 0.07 K/uL  Comprehensive metabolic panel  Result  Value Ref Range   Sodium 138 135 - 145 mmol/L   Potassium 3.6 3.5 - 5.1 mmol/L   Chloride 103 98 - 111 mmol/L   CO2 29 22 - 32 mmol/L   Glucose, Bld 107 (H) 70 - 99 mg/dL   BUN 13 6 - 20 mg/dL   Creatinine, Ser 4.65 0.61 - 1.24 mg/dL   Calcium 8.7 (L) 8.9 - 10.3 mg/dL   Total Protein 8.0 6.5 - 8.1 g/dL   Albumin 4.1 3.5 - 5.0 g/dL   AST 16 15 - 41 U/L   ALT 18 0 - 44 U/L   Alkaline Phosphatase 43 38 - 126 U/L   Total Bilirubin 0.9 0.3 - 1.2 mg/dL   GFR, Estimated >68 >12 mL/min   Anion gap 6 5 - 15  Brain natriuretic peptide  Result Value Ref Range   B Natriuretic Peptide 18.9 0.0 - 100.0 pg/mL  Troponin I (High Sensitivity)  Result Value Ref Range   Troponin I (High Sensitivity) 9 <18 ng/L  Troponin I (High Sensitivity)  Result Value Ref Range   Troponin I (High Sensitivity) 11 <18  ng/L      Assessment & Plan:   Problem List Items Addressed This Visit       Cardiovascular and Mediastinum   Essential hypertension - Primary   Cardiomyopathy of undetermined type (HCC)     Other   Obesity, Class III, BMI 40-49.9 (morbid obesity) (HCC)     Follow up plan: No follow-ups on file.

## 2021-12-03 IMAGING — CR DG CHEST 2V
1 series · 2 of 2 positions shown · non-contrast
Comparison: January 25, 2017

CLINICAL DATA: Chest pain

EXAM:
CHEST - 2 VIEW

[Series 1: dg chest 2 view · 0.14mm/px · 2 of 2 slices shown]
[im 1/2]
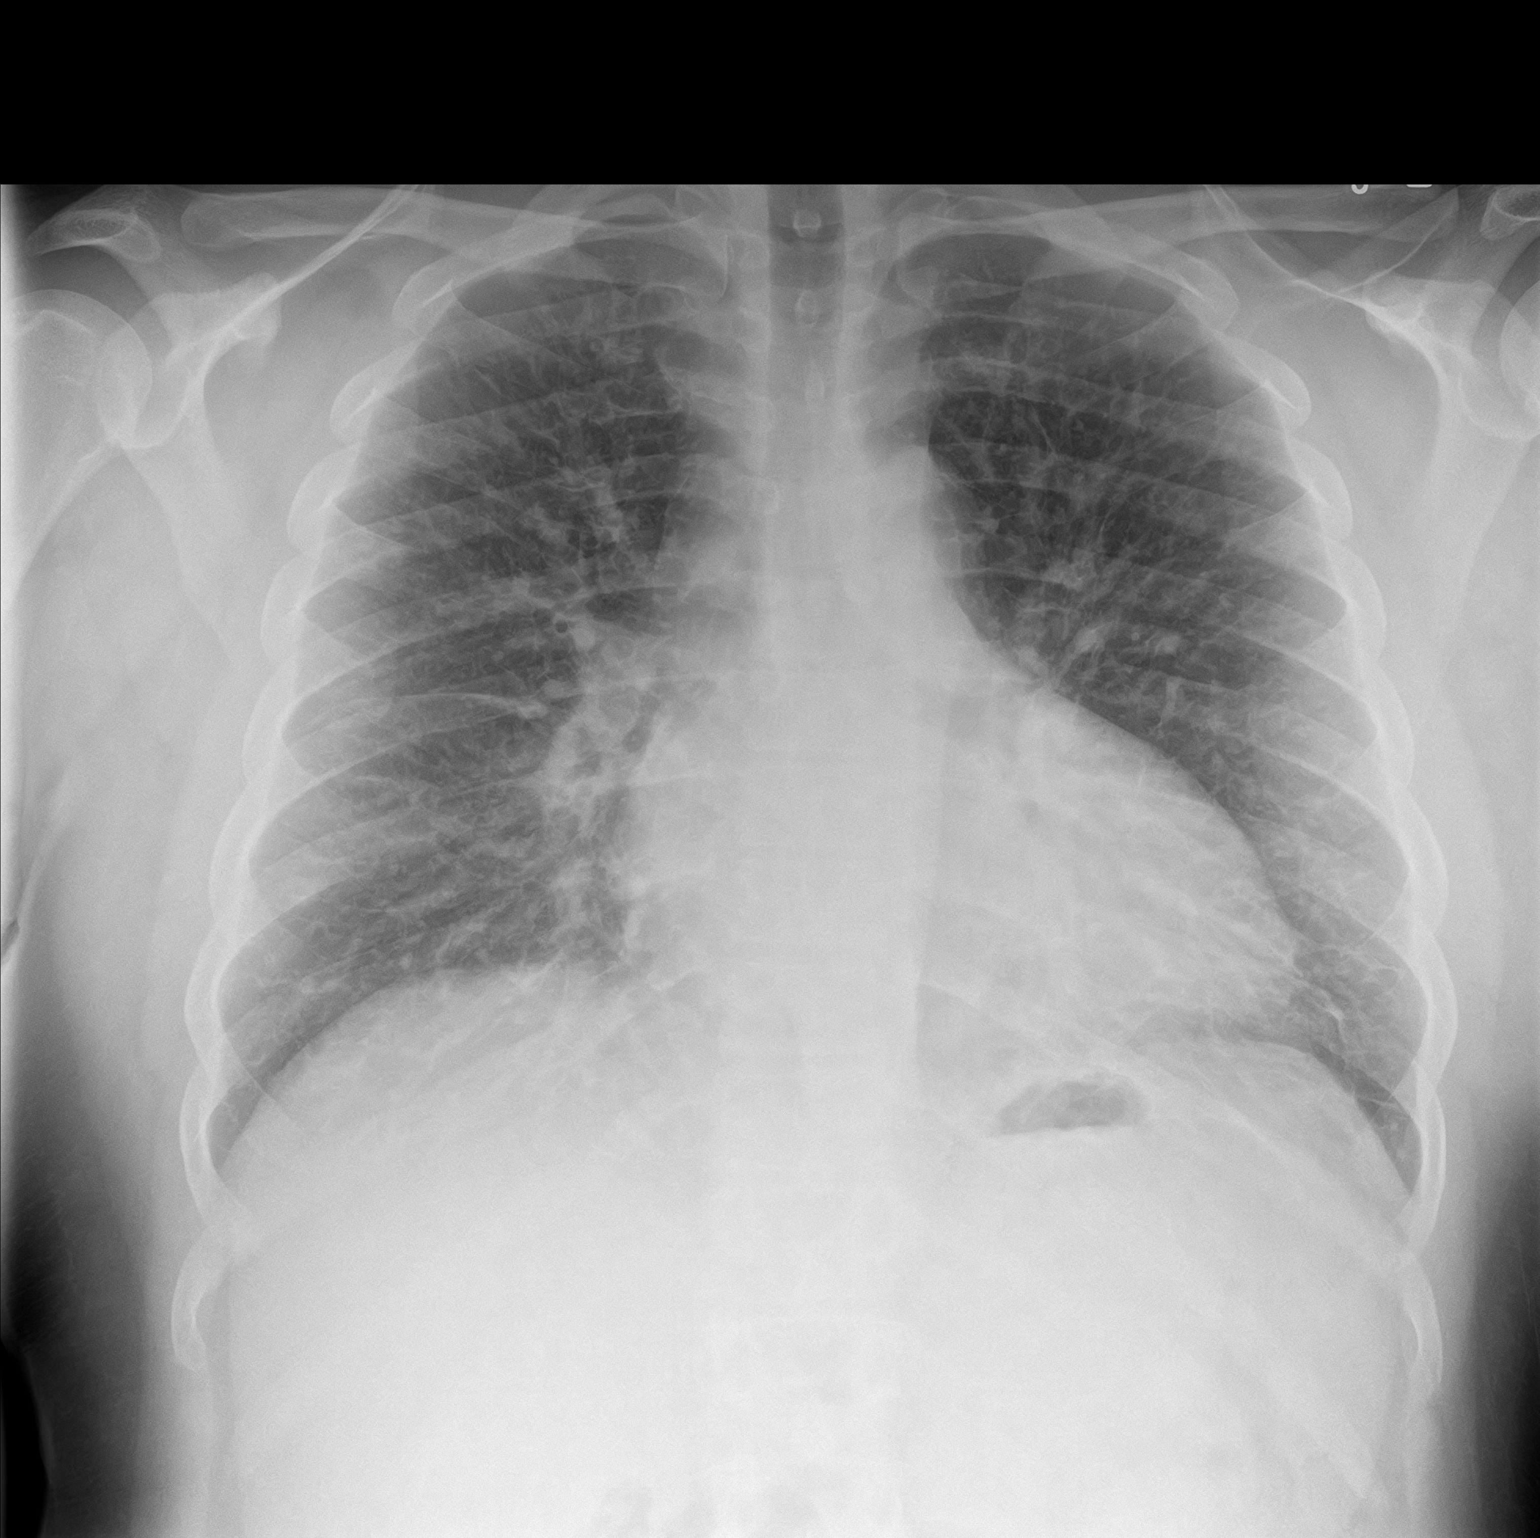
[im 2/2]
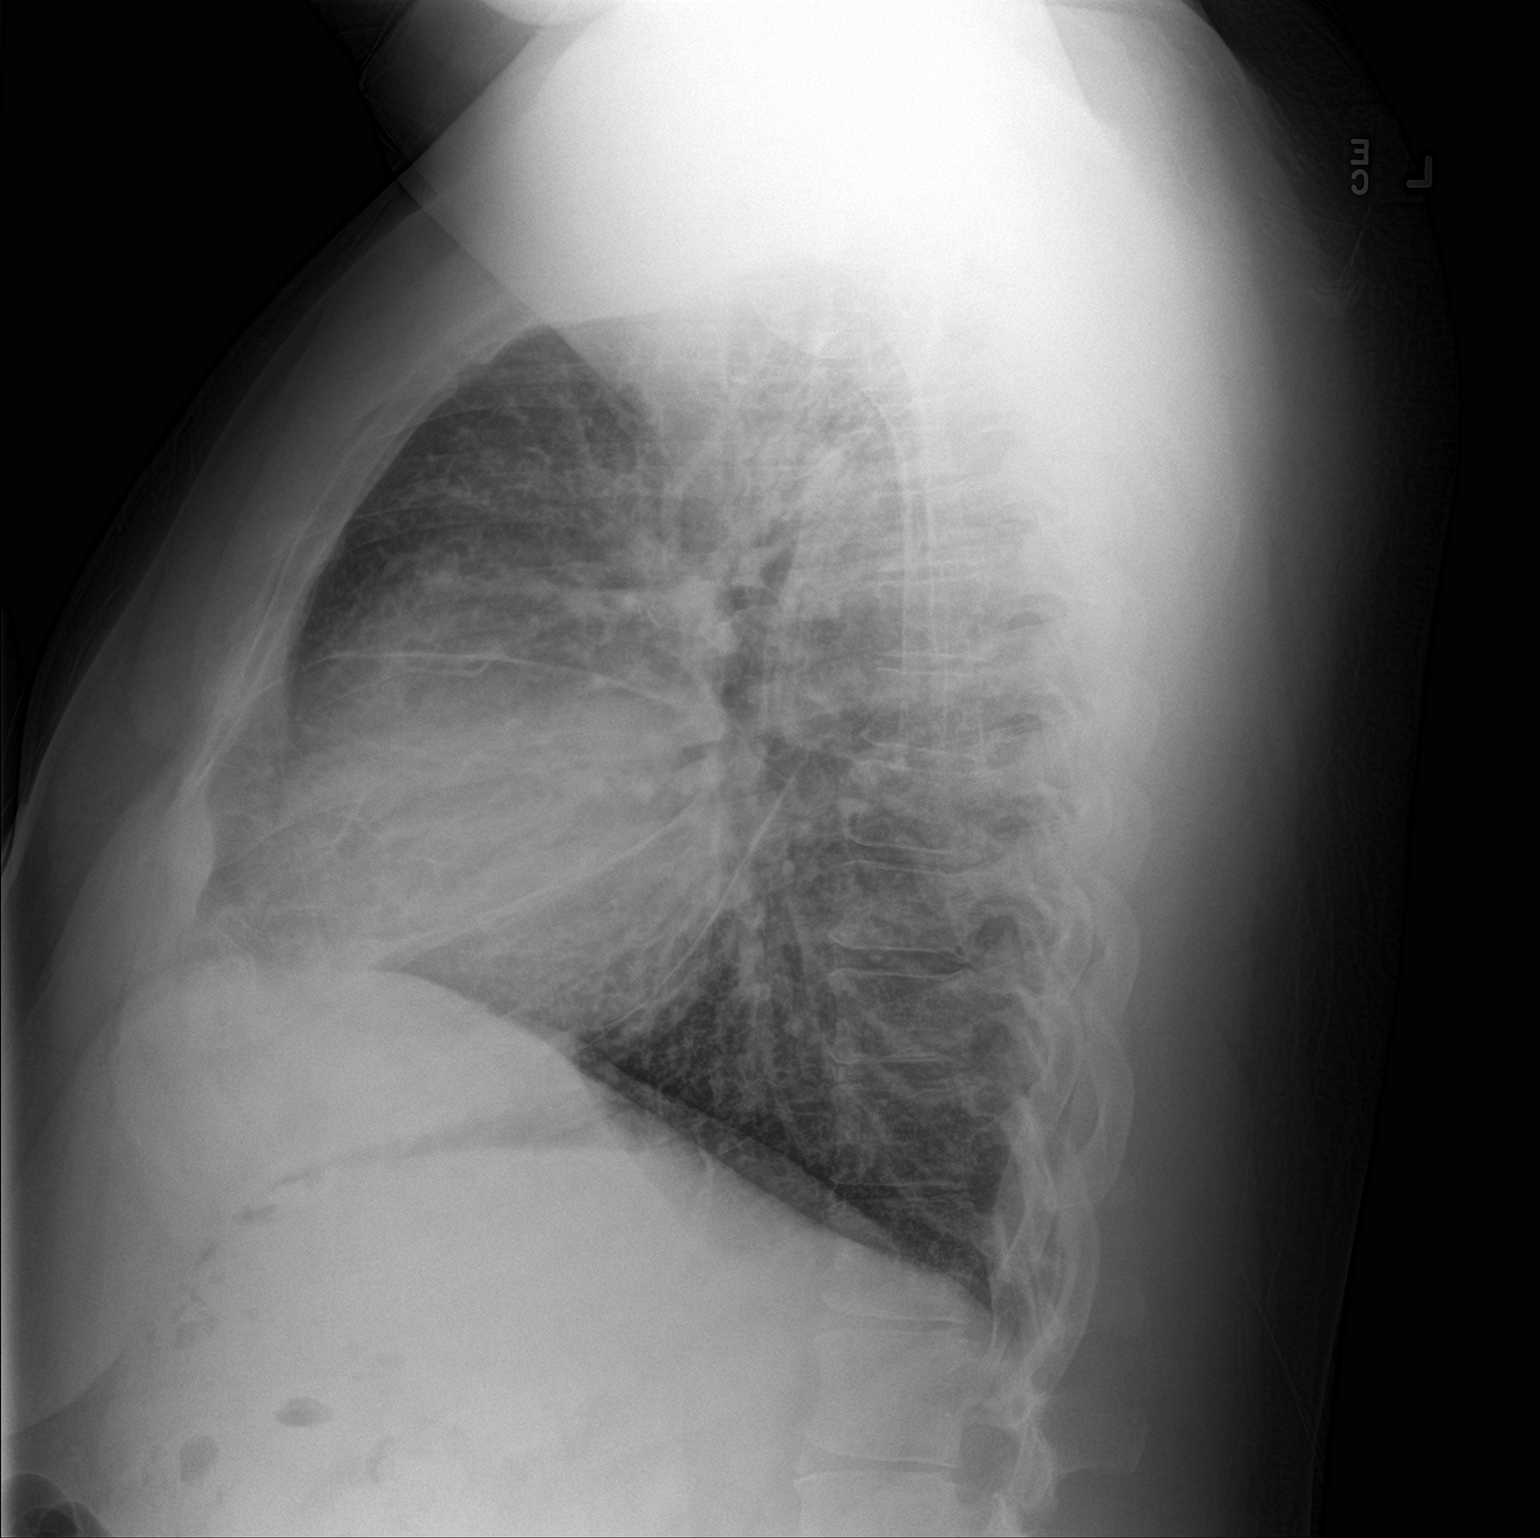

[2 of 2 positions shown; findings below may reference images not displayed]

FINDINGS: The cardiac silhouette is enlarged. There are prominent interstitial
lung markings bilaterally. There is no pneumothorax. No convincing
pleural effusion. There is some fluid within the fissures. There are
few small subtle airspace opacities bilaterally.
IMPRESSION: Cardiomegaly with findings suggestive of developing pulmonary edema.
An underlying atypical infectious process is not entirely excluded.

## 2021-12-06 ENCOUNTER — Other Ambulatory Visit
Admission: RE | Admit: 2021-12-06 | Discharge: 2021-12-06 | Disposition: A | Discharge status: Home / Self Care | Payer: BC Managed Care – PPO | Source: Ambulatory Visit | Attending: Family | Admitting: Family

## 2021-12-06 ENCOUNTER — Telehealth: Payer: Self-pay

## 2021-12-06 ENCOUNTER — Encounter: Payer: Self-pay | Admitting: Family

## 2021-12-06 ENCOUNTER — Ambulatory Visit (HOSPITAL_BASED_OUTPATIENT_CLINIC_OR_DEPARTMENT_OTHER): Payer: BC Managed Care – PPO | Admitting: Family

## 2021-12-06 ENCOUNTER — Encounter: Payer: Self-pay | Admitting: Licensed Clinical Social Worker

## 2021-12-06 VITALS — BP 135/87 | HR 79 | Resp 14 | Ht 68.0 in | Wt 259.1 lb

## 2021-12-06 DIAGNOSIS — I5022 Chronic systolic (congestive) heart failure: Secondary | ICD-10-CM | POA: Insufficient documentation

## 2021-12-06 DIAGNOSIS — I110 Hypertensive heart disease with heart failure: Secondary | ICD-10-CM | POA: Insufficient documentation

## 2021-12-06 DIAGNOSIS — Z79899 Other long term (current) drug therapy: Secondary | ICD-10-CM | POA: Insufficient documentation

## 2021-12-06 DIAGNOSIS — G4733 Obstructive sleep apnea (adult) (pediatric): Secondary | ICD-10-CM | POA: Insufficient documentation

## 2021-12-06 DIAGNOSIS — I10 Essential (primary) hypertension: Secondary | ICD-10-CM | POA: Diagnosis not present

## 2021-12-06 DIAGNOSIS — F329 Major depressive disorder, single episode, unspecified: Secondary | ICD-10-CM | POA: Diagnosis not present

## 2021-12-06 DIAGNOSIS — F32A Depression, unspecified: Secondary | ICD-10-CM | POA: Insufficient documentation

## 2021-12-06 LAB — BASIC METABOLIC PANEL
Anion gap: 9 (ref 5–15)
BUN: 14 mg/dL (ref 6–20)
CO2: 26 mmol/L (ref 22–32)
Calcium: 8.8 mg/dL — ABNORMAL LOW (ref 8.9–10.3)
Chloride: 103 mmol/L (ref 98–111)
Creatinine, Ser: 1.14 mg/dL (ref 0.61–1.24)
GFR, Estimated: >60 mL/min (ref >60)
Glucose, Bld: 117 mg/dL — ABNORMAL HIGH (ref 70–99)
Potassium: 3.2 mmol/L — ABNORMAL LOW (ref 3.5–5.1)
Sodium: 138 mmol/L (ref 135–145)

## 2021-12-06 MED ORDER — SPIRONOLACTONE 25 MG PO TABS: 25.0000 mg | ORAL_TABLET | Freq: Every day | ORAL | 3 refills | Status: DC

## 2021-12-06 NOTE — Progress Notes (Signed)
Patient ID: Benjamin Goodman., male    DOB: [birth year], 36 y.o.   MRN: [id number]   [name] is a 36 y/o male with a history of HTN, sleep apnea, depression and chronic heart failure.  Echo report from 09/14/19 reviewed and showed an EF of 35-40% along with moderate LVH and trivial MR.   04/11/21 ED visit for chest wall pain. Diagnosed with URI and cough, given amoxicillin and tessalon pearls and d/c'd home.   He presents today for a follow-up visit with a chief complaint of moderate fatigue with minimal exertion. Describes this as chronic in nature. He has associated decreased appetite, left eye irritation, cough, difficulty sleeping, depression and headaches along with this. He denies any dizziness, suicidal thoughts, abdominal distention, palpitations, pedal edema, chest pain, wheezing or shortness of breath.   Needs a scale. Now living in an apartment with his son and says that things are "slowly improving". Now that he has an apt, he's wearing his CPAP but can only tolerate it for 2-3 hours a night.   Needs new RX for spironolactone/potassium if he's going to stay on those. He's been out of potassium for several weeks.   Past Medical History:  Diagnosis Date   CHF (congestive heart failure) (HCC)    Hypertension    Sleep apnea    Past Surgical History:  Procedure Laterality Date   TONSILLECTOMY     Family History  Problem Relation Age of Onset   Asthma Mother    Cancer Mother    Diabetes Mother    Hyperlipidemia Mother    Hypertension Mother    Hypertension Father    Heart attack Maternal Uncle    Social History   Tobacco Use   Smoking status: Never   Smokeless tobacco: Never  Substance Use Topics   Alcohol use: No   No Known Allergies  Prior to Admission medications   Medication Sig Start Date End Date Taking? Authorizing Provider  acetaminophen (TYLENOL) 325 MG tablet Take 650 mg by mouth every 6 (six) hours as needed for mild pain or headache.   Yes [provider]  AMLODIPINE BESYLATE PO Take 10 mg by mouth daily.   Yes [provider]  aspirin 81 MG chewable tablet Chew 81 mg by mouth daily.   Yes [provider]  cloNIDine (CATAPRES) 0.1 MG tablet Take 0.1 mg by mouth 2 (two) times daily.   Yes [provider]  furosemide (LASIX) 40 MG tablet Take 1 tablet (40 mg total) by mouth daily. 03/28/21  Yes Darylene Price A, FNP  hydrALAZINE (APRESOLINE) 50 MG tablet Take 1 tablet (50 mg total) by mouth 3 (three) times daily. 03/05/21  Yes Darylene Price A, FNP  isosorbide mononitrate (IMDUR) 30 MG 24 hr tablet Take 1 tablet (30 mg total) by mouth daily. 03/28/21  Yes Darylene Price A, FNP  metoprolol (TOPROL XL) 200 MG 24 hr tablet Take 1 tablet (200 mg total) by mouth daily. 10/16/20  Yes Gailyn Crook, Aura Fey, FNP  Multiple Vitamins-Minerals (MULTIVITAMIN MEN PO) Take 1 tablet by mouth daily.   Yes [provider]  sacubitril-valsartan (ENTRESTO) 97-103 MG Take 1 tablet by mouth 2 (two) times daily. 04/10/20  Yes Darylene Price A, FNP  sertraline (ZOLOFT) 100 MG tablet Take 1 tablet (100 mg total) by mouth daily. Future refills from PCP 03/28/21  Yes Darylene Price A, FNP  spironolactone (ALDACTONE) 25 MG tablet Take 1 tablet (25 mg total) by mouth daily. 10/16/20  Yes Darylene Price A, FNP  aspirin 325 MG tablet Take 325 mg by mouth daily.  Patient not taking: Reported on 03/05/2021    [provider]    Review of Systems  Constitutional:  Positive for appetite change (decreased) and fatigue (easily).  HENT:  Negative for congestion, postnasal drip and sore throat.   Eyes:  Positive for visual disturbance (left eye irritated due to sleeping in contact).  Respiratory:  Positive for cough. Negative for shortness of breath and wheezing.   Cardiovascular:  Negative for chest pain, palpitations and leg swelling.  Gastrointestinal:  Negative for abdominal distention and abdominal pain.  Endocrine: Negative.    Genitourinary: Negative.   Musculoskeletal:  Negative for back pain and neck pain.  Skin: Negative.   Allergic/Immunologic: Negative.   Neurological:  Positive for headaches. Negative for dizziness, syncope and light-headedness.  Hematological:  Negative for adenopathy. Does not bruise/bleed easily.  Psychiatric/Behavioral:  Positive for dysphoric mood and sleep disturbance (wearing CPAP 2-3 hours/night). Negative for agitation, self-injury and suicidal ideas. The patient is not nervous/anxious.    Vitals:   12/06/21 1114  BP: 135/87  Pulse: 79  Resp: 14  SpO2: 99%  Weight: 259 lb 2 oz (117.5 kg)  Height: _0  (1.727 m)   Wt Readings from Last 3 Encounters:  12/06/21 259 lb 2 oz (117.5 kg)  04/30/21 245 lb 8 oz (111.4 kg)  04/11/21 245 lb (111.1 kg)    Lab Results  Component Value Date   CREATININE 1.17 04/11/2021   CREATININE 1.23 11/02/2020   CREATININE 1.02 10/16/2020    Physical Exam Vitals and nursing note reviewed.  Constitutional:      Appearance: Normal appearance.  HENT:     Head: Normocephalic and atraumatic.  Cardiovascular:     Rate and Rhythm: Normal rate and regular rhythm.     Pulses: Normal pulses.     Heart sounds: Normal heart sounds. No murmur heard.    No gallop.  Pulmonary:     Effort: Pulmonary effort is normal. No respiratory distress.     Breath sounds: No wheezing or rales.  Abdominal:     General: There is no distension.     Palpations: Abdomen is soft.     Tenderness: There is no abdominal tenderness.  Musculoskeletal:        General: No tenderness.     Cervical back: Normal range of motion and neck supple.     Right lower leg: No edema.     Left lower leg: No edema.  Skin:    General: Skin is warm and dry.  Neurological:     General: No focal deficit present.     Mental Status: He is alert and oriented to person, place, and time.  Psychiatric:        Mood and Affect: Mood is depressed.        Behavior: Behavior normal.         Thought Content: Thought content normal.     Assessment & Plan:  1: Chronic heart failure with reduced ejection fraction- - NYHA class III - euvolemic today - not weighing daily as he needs scales; set of scales provided and he was  reminded to call for an overnight weight gain of >2 pounds or a weekly weight gain of >5 pounds - weight up 14 lbs since last visit here 8 months ago - not adding salt but says that he and his son do eat out often although he tries to  make good choices such as grilled chicken instead of fried - saw cardiology ([name]) 12/14/20 - on GDMT of entresto, metoprolol and spironolactone - consider adding SGLT2 at next visit - will check BMP today to assess whether potassium supplement needs to be renewed - BNP 04/11/21 was 18.9  2: HTN- - BP looks good today (135/87) - had new PCP appointment scheduled at [facility] but didn't have the $100 and then when he missed the appointment and r/s it, he was told he would have to pay $200 - living in [place] so encouraged him to call [facility] and see if he can get something scheduled - BMP 04/11/21 reviewed and showed sodium 138, potassium 3.6, creatinine 1.17 and GFR >60 - saw nephrology Holley Raring) 11/14/20  3: Severe obstructive sleep apnea- - wearing his CPAP 2-3 hours at a time; trying to get used to wearing it again as he's been without it for many months - does work 4pm-4am   4: Depression- - improved from last visit - now living in an apartment and has his 63 y/o son living with him - admits that finances are quite tight - [place] [facility] social worker met with patient today   Medication bottles reviewed.   Return in one month.  He is aware to call/return in the interim for increased or worsening HF s/s.

## 2021-12-06 NOTE — Patient Instructions (Addendum)
Continue weighing daily and call for an overnight weight gain of 3 pounds or more or a weekly weight gain of more than 5 pounds.   If you have voicemail, please make sure your mailbox is cleaned out so that we may leave a message and please make sure to listen to any voicemails.    Call [facility] to get established with them.

## 2021-12-06 NOTE — Telephone Encounter (Addendum)
Informed patient of message below and lab results. Patient had no questions at this time, and states he will pick up prescription for spironolactone Suanne Marker, RN ----- Message from Delma Freeze, FNP sent at 12/06/2021  1:04 PM EDT ----- Please call patient and tell him I sent prescription into [facility] for spironolactone 25mg  daily. No need to restart potassium supplements at this time. Kidney function looks great!

## 2021-12-06 NOTE — Progress Notes (Signed)
Heart and Vascular Care Navigation  12/06/2021  Benjamin Goodman [birth year] [id number]  Reason for Referral: Patient seen in HF Clinic for financial resources.   Engaged with patient face to face for initial visit for Heart and Vascular Care Coordination.                                                                                                   Assessment:  Patient is a 36 yo male who was homeless living in a [place] until last month. Patient reports he and his finance had a falling out and he had no where to go. Patient works full time with Immunologist and has been able to save to get an apartment. He 42 yo son was staying with his mother while he was homeless but he was able to have him return with him when he got the apartment.   Patient shared that he is struggling financially due to upfront monies needed to obtain the apartment and risking his car payment.                               HRT/VAS Care Coordination     Patients Home Cardiology Office [facility]   Outpatient Care Team Social Worker   Social Worker Name: Lasandra Beech, [place] [phone]   Living arrangements for the past 2 months Apartment   Lives with: Minor Children  61 yo son   Patient Current Optometrist   Patient Has Concern With Paying Medical Bills No   Does Patient Have Prescription Coverage? Yes   Home Assistive Devices/Equipment None       Social History:                                                                             SDOH Screenings   Alcohol Screen: Not on file  Depression (PHQ2-9): Medium Risk (04/30/2021)   Depression (PHQ2-9)    PHQ-2 Score: 15  Financial Resource Strain: High Risk (12/06/2021)   Overall Financial Resource Strain (CARDIA)    Difficulty of Paying Living Expenses: Hard  Food Insecurity: Food Insecurity Present (12/06/2021)   Hunger Vital Sign    Worried About Running Out of Food in the Last Year: Sometimes true    Ran Out of Food in  the Last Year: Sometimes true  Housing: Medium Risk (12/06/2021)   Housing    Last Housing Risk Score: 1  Physical Activity: Not on file  Social Connections: Not on file  Stress: Not on file  Tobacco Use: Low Risk  (04/30/2021)   Patient History    Smoking Tobacco Use: Never    Smokeless Tobacco Use: Never    Passive Exposure: Not on file  Transportation Needs: No Transportation Needs (  12/06/2021)   PRAPARE - Administrator, Civil Service (Medical): No    Lack of Transportation (Non-Medical): No    SDOH Interventions: Financial Resources:  Financial Strain Interventions: Other (Comment) (Patient Care [employer]) Assist with rent  Food Insecurity:  Food Insecurity Interventions: Intervention Not Indicated  Housing Insecurity:  Housing Interventions: Intervention Not Indicated  Transportation:   Transportation Interventions: Intervention Not Indicated   Follow-up plan:  Patient states he has the past due monies for his car payment although short this month for rent due to upfront monies needed to move in. CSW discussed Patient Care Fund and needed documents to assist with fund. Patient grateful and will send when obtained. Lasandra Beech, LCSW, CCSW-MCS [phone]

## 2022-01-03 ENCOUNTER — Telehealth: Payer: Self-pay | Admitting: Family

## 2022-01-03 ENCOUNTER — Ambulatory Visit: Payer: BC Managed Care – PPO | Admitting: Family

## 2022-01-03 NOTE — Telephone Encounter (Signed)
Patient did not show for his Heart Failure Clinic appointment on 01/03/22. Will attempt to reschedule.   

## 2022-01-03 NOTE — Progress Notes (Deleted)
Patient ID: Benjamin Goodman., male    DOB: 1985/07/25, 36 y.o.   MRN: [id number]   Mr Vanderzee is a 36 y/o male with a history of HTN, sleep apnea, depression and chronic heart failure.  Echo report from 09/14/19 reviewed and showed an EF of 35-40% along with moderate LVH and trivial MR.   Has not been admitted or been in the ED in the last 6 months.   He presents today for a follow-up visit with a chief complaint of   Past Medical History:  Diagnosis Date   CHF (congestive heart failure) (HCC)    Hypertension    Sleep apnea    Past Surgical History:  Procedure Laterality Date   TONSILLECTOMY     Family History  Problem Relation Age of Onset   Asthma Mother    Cancer Mother    Diabetes Mother    Hyperlipidemia Mother    Hypertension Mother    Hypertension Father    Heart attack Maternal Uncle    Social History   Tobacco Use   Smoking status: Never   Smokeless tobacco: Never  Substance Use Topics   Alcohol use: No   No Known Allergies    Review of Systems  Constitutional:  Positive for appetite change (decreased) and fatigue (easily).  HENT:  Negative for congestion, postnasal drip and sore throat.   Eyes:  Positive for visual disturbance (left eye irritated due to sleeping in contact).  Respiratory:  Positive for cough. Negative for shortness of breath and wheezing.   Cardiovascular:  Negative for chest pain, palpitations and leg swelling.  Gastrointestinal:  Negative for abdominal distention and abdominal pain.  Endocrine: Negative.   Genitourinary: Negative.   Musculoskeletal:  Negative for back pain and neck pain.  Skin: Negative.   Allergic/Immunologic: Negative.   Neurological:  Positive for headaches. Negative for dizziness, syncope and light-headedness.  Hematological:  Negative for adenopathy. Does not bruise/bleed easily.  Psychiatric/Behavioral:  Positive for dysphoric mood and sleep disturbance (wearing CPAP 2-3 hours/night). Negative for  agitation, self-injury and suicidal ideas. The patient is not nervous/anxious.       Physical Exam Vitals and nursing note reviewed.  Constitutional:      Appearance: Normal appearance.  HENT:     Head: Normocephalic and atraumatic.  Cardiovascular:     Rate and Rhythm: Normal rate and regular rhythm.     Pulses: Normal pulses.     Heart sounds: Normal heart sounds. No murmur heard.    No gallop.  Pulmonary:     Effort: Pulmonary effort is normal. No respiratory distress.     Breath sounds: No wheezing or rales.  Abdominal:     General: There is no distension.     Palpations: Abdomen is soft.     Tenderness: There is no abdominal tenderness.  Musculoskeletal:        General: No tenderness.     Cervical back: Normal range of motion and neck supple.     Right lower leg: No edema.     Left lower leg: No edema.  Skin:    General: Skin is warm and dry.  Neurological:     General: No focal deficit present.     Mental Status: He is alert and oriented to person, place, and time.  Psychiatric:        Mood and Affect: Mood is depressed.        Behavior: Behavior normal.        Thought Content:  Thought content normal.     Assessment & Plan:  1: Chronic heart failure with reduced ejection fraction- - NYHA class III - euvolemic today - not weighing daily as he needs scales; set of scales provided and he was  reminded to call for an overnight weight gain of >2 pounds or a weekly weight gain of >5 pounds - weight 259.2 lbs since last visit here 1 month ago - not adding salt but says that he and his son do eat out often although he tries to make good choices such as grilled chicken instead of fried - saw cardiology ([name]) 12/14/20 - on GDMT of entresto, metoprolol and spironolactone - consider adding SGLT2 at next visit  - BNP 04/11/21 was 18.9  2: HTN- - BP  - needs PCP - BMP 12/06/21 reviewed and showed sodium 138, potassium 3.2, creatinine 1.14 and GFR >60 - recheck labs  today - saw nephrology Cherylann Ratel) 11/14/20  3: Severe obstructive sleep apnea- - wearing his CPAP 2-3 hours at a time; trying to get used to wearing it again as he's been without it for many months - does work 4pm-4am   4: Depression- - improved from last visit - now living in an apartment and has his 52 y/o son living with him - admits that finances are quite tight   Medication bottles reviewed.

## 2022-02-06 ENCOUNTER — Encounter: Payer: Self-pay | Admitting: Emergency Medicine

## 2022-02-06 ENCOUNTER — Other Ambulatory Visit: Payer: Self-pay

## 2022-02-06 ENCOUNTER — Observation Stay
Admit: 2022-02-06 | Discharge: 2022-02-06 | Disposition: A | Discharge status: Home / Self Care | Payer: BC Managed Care – PPO | Attending: Internal Medicine | Admitting: Internal Medicine

## 2022-02-06 ENCOUNTER — Emergency Department: Payer: BC Managed Care – PPO

## 2022-02-06 ENCOUNTER — Observation Stay
Admission: EM | Admit: 2022-02-06 | Discharge: 2022-02-06 | Disposition: A | Discharge status: Home / Self Care | Payer: BC Managed Care – PPO | Attending: Internal Medicine | Admitting: Internal Medicine

## 2022-02-06 DIAGNOSIS — I5042 Chronic combined systolic (congestive) and diastolic (congestive) heart failure: Secondary | ICD-10-CM | POA: Diagnosis not present

## 2022-02-06 DIAGNOSIS — N179 Acute kidney failure, unspecified: Secondary | ICD-10-CM | POA: Diagnosis not present

## 2022-02-06 DIAGNOSIS — R944 Abnormal results of kidney function studies: Secondary | ICD-10-CM | POA: Diagnosis not present

## 2022-02-06 DIAGNOSIS — R7989 Other specified abnormal findings of blood chemistry: Secondary | ICD-10-CM | POA: Diagnosis present

## 2022-02-06 DIAGNOSIS — K529 Noninfective gastroenteritis and colitis, unspecified: Secondary | ICD-10-CM | POA: Diagnosis not present

## 2022-02-06 DIAGNOSIS — G4730 Sleep apnea, unspecified: Secondary | ICD-10-CM | POA: Diagnosis present

## 2022-02-06 DIAGNOSIS — I160 Hypertensive urgency: Secondary | ICD-10-CM | POA: Diagnosis not present

## 2022-02-06 DIAGNOSIS — Z20822 Contact with and (suspected) exposure to covid-19: Secondary | ICD-10-CM | POA: Diagnosis not present

## 2022-02-06 DIAGNOSIS — R519 Headache, unspecified: Secondary | ICD-10-CM | POA: Diagnosis not present

## 2022-02-06 DIAGNOSIS — E66813 Obesity, class 3: Secondary | ICD-10-CM | POA: Diagnosis present

## 2022-02-06 DIAGNOSIS — R079 Chest pain, unspecified: Secondary | ICD-10-CM | POA: Diagnosis present

## 2022-02-06 DIAGNOSIS — R778 Other specified abnormalities of plasma proteins: Secondary | ICD-10-CM | POA: Diagnosis present

## 2022-02-06 DIAGNOSIS — I5022 Chronic systolic (congestive) heart failure: Secondary | ICD-10-CM | POA: Diagnosis not present

## 2022-02-06 DIAGNOSIS — E876 Hypokalemia: Secondary | ICD-10-CM | POA: Diagnosis present

## 2022-02-06 LAB — COMPREHENSIVE METABOLIC PANEL
ALT: 31 U/L (ref 0–44)
AST: 30 U/L (ref 15–41)
Albumin: 3.8 g/dL (ref 3.5–5.0)
Alkaline Phosphatase: 37 U/L — ABNORMAL LOW (ref 38–126)
Anion gap: 7 (ref 5–15)
BUN: 14 mg/dL (ref 6–20)
CO2: 27 mmol/L (ref 22–32)
Calcium: 8.1 mg/dL — ABNORMAL LOW (ref 8.9–10.3)
Chloride: 105 mmol/L (ref 98–111)
Creatinine, Ser: 1.32 mg/dL — ABNORMAL HIGH (ref 0.61–1.24)
GFR, Estimated: >60 mL/min (ref >60)
Glucose, Bld: 119 mg/dL — ABNORMAL HIGH (ref 70–99)
Potassium: 2.7 mmol/L — CL (ref 3.5–5.1)
Sodium: 139 mmol/L (ref 135–145)
Total Bilirubin: 0.8 mg/dL (ref 0.3–1.2)
Total Protein: 7.1 g/dL (ref 6.5–8.1)

## 2022-02-06 LAB — ECHOCARDIOGRAM COMPLETE
AR max vel: 2.19 cm2
AV Area VTI: 2.23 cm2
AV Area mean vel: 2.29 cm2
AV Mean grad: 3.5 mmHg
AV Peak grad: 7.6 mmHg
Ao pk vel: 1.38 m/s
Area-P 1/2: 4.49 cm2
Calc EF: 53.1 %
Height: 68 in
MV VTI: 2.4 cm2
S' Lateral: 3.98 cm
Single Plane A2C EF: 51.4 %
Single Plane A4C EF: 52.9 %
Weight: 4160 oz

## 2022-02-06 LAB — LIPASE, BLOOD: Lipase: 26 U/L (ref 11–51)

## 2022-02-06 LAB — RESP PANEL BY RT-PCR (FLU A&B, COVID) ARPGX2
Influenza A by PCR: NEGATIVE
Influenza B by PCR: NEGATIVE
SARS Coronavirus 2 by RT PCR: NEGATIVE

## 2022-02-06 LAB — MAGNESIUM: Magnesium: 1.6 mg/dL — ABNORMAL LOW (ref 1.7–2.4)

## 2022-02-06 LAB — CBC
HCT: 43.4 % (ref 39.0–52.0)
Hemoglobin: 14.4 g/dL (ref 13.0–17.0)
MCH: 27.6 pg (ref 26.0–34.0)
MCHC: 33.2 g/dL (ref 30.0–36.0)
MCV: 83.1 fL (ref 80.0–100.0)
Platelets: 155 10*3/uL (ref 150–400)
RBC: 5.22 MIL/uL (ref 4.22–5.81)
RDW: 15.3 % (ref 11.5–15.5)
WBC: 5.2 10*3/uL (ref 4.0–10.5)
nRBC: 0 % (ref 0.0–0.2)

## 2022-02-06 LAB — TROPONIN I (HIGH SENSITIVITY)
Troponin I (High Sensitivity): 64 ng/L — ABNORMAL HIGH (ref <18)
Troponin I (High Sensitivity): 59 ng/L — ABNORMAL HIGH (ref <18)

## 2022-02-06 MED ORDER — ACETAMINOPHEN 325 MG PO TABS: 650.0000 mg | ORAL_TABLET | Freq: Four times a day (QID) | ORAL | Status: DC | PRN

## 2022-02-06 MED ORDER — ACETAMINOPHEN 650 MG RE SUPP: 650.0000 mg | Freq: Four times a day (QID) | RECTAL | Status: DC | PRN

## 2022-02-06 MED ORDER — ASPIRIN 81 MG PO CHEW
81.0000 mg | CHEWABLE_TABLET | Freq: Every day | ORAL | Status: DC
  Administered 2022-02-06: 81 mg via ORAL
  Filled 2022-02-06: qty 1

## 2022-02-06 MED ORDER — MAGNESIUM SULFATE 2 GM/50ML IV SOLN
2.0000 g | Freq: Once | INTRAVENOUS | Status: AC
  Administered 2022-02-06: 2 g via INTRAVENOUS
  Filled 2022-02-06: qty 50

## 2022-02-06 MED ORDER — SACUBITRIL-VALSARTAN 97-103 MG PO TABS
1.0000 | ORAL_TABLET | Freq: Two times a day (BID) | ORAL | Status: DC
  Administered 2022-02-06: 1 via ORAL
  Filled 2022-02-06 (×2): qty 1

## 2022-02-06 MED ORDER — CLONIDINE HCL 0.1 MG PO TABS
0.2000 mg | ORAL_TABLET | Freq: Once | ORAL | Status: AC
  Administered 2022-02-06: 0.2 mg via ORAL
  Filled 2022-02-06: qty 2

## 2022-02-06 MED ORDER — SPIRONOLACTONE 25 MG PO TABS: 25.0000 mg | ORAL_TABLET | Freq: Every day | ORAL | Status: DC

## 2022-02-06 MED ORDER — ONDANSETRON HCL 4 MG/2ML IJ SOLN
4.0000 mg | Freq: Once | INTRAMUSCULAR | Status: AC
  Administered 2022-02-06: 4 mg via INTRAVENOUS
  Filled 2022-02-06: qty 2

## 2022-02-06 MED ORDER — ENOXAPARIN SODIUM 60 MG/0.6ML IJ SOSY: 0.5000 mg/kg | PREFILLED_SYRINGE | INTRAMUSCULAR | Status: DC

## 2022-02-06 MED ORDER — POTASSIUM CHLORIDE 10 MEQ/100ML IV SOLN
10.0000 meq | INTRAVENOUS | Status: AC
  Administered 2022-02-06 (×2): 10 meq via INTRAVENOUS
  Filled 2022-02-06 (×2): qty 100

## 2022-02-06 MED ORDER — CLONIDINE HCL 0.1 MG PO TABS: 0.2000 mg | ORAL_TABLET | Freq: Two times a day (BID) | ORAL | Status: DC

## 2022-02-06 MED ORDER — ISOSORBIDE MONONITRATE ER 60 MG PO TB24
30.0000 mg | ORAL_TABLET | Freq: Every day | ORAL | Status: DC
Start: 2022-02-06 — End: 2022-02-06

## 2022-02-06 MED ORDER — LABETALOL HCL 5 MG/ML IV SOLN
20.0000 mg | Freq: Once | INTRAVENOUS | Status: AC
  Administered 2022-02-06: 20 mg via INTRAVENOUS
  Filled 2022-02-06: qty 4

## 2022-02-06 MED ORDER — HYDRALAZINE HCL 50 MG PO TABS: 50.0000 mg | ORAL_TABLET | Freq: Three times a day (TID) | ORAL | Status: DC

## 2022-02-06 MED ORDER — FUROSEMIDE 40 MG PO TABS
40.0000 mg | ORAL_TABLET | Freq: Every day | ORAL | Status: DC
  Administered 2022-02-06: 40 mg via ORAL
  Filled 2022-02-06: qty 1

## 2022-02-06 MED ORDER — SERTRALINE HCL 50 MG PO TABS
100.0000 mg | ORAL_TABLET | Freq: Every day | ORAL | Status: DC
  Administered 2022-02-06: 100 mg via ORAL
  Filled 2022-02-06: qty 2

## 2022-02-06 MED ORDER — HYDRALAZINE HCL 50 MG PO TABS
50.0000 mg | ORAL_TABLET | Freq: Three times a day (TID) | ORAL | Status: DC
  Administered 2022-02-06: 50 mg via ORAL
  Filled 2022-02-06: qty 1

## 2022-02-06 MED ORDER — POTASSIUM CHLORIDE CRYS ER 20 MEQ PO TBCR
40.0000 meq | EXTENDED_RELEASE_TABLET | Freq: Once | ORAL | Status: AC
Start: 2022-02-06 — End: 2022-02-06
  Administered 2022-02-06: 40 meq via ORAL
  Filled 2022-02-06: qty 2

## 2022-02-06 MED ORDER — ONDANSETRON HCL 4 MG PO TABS: 4.0000 mg | ORAL_TABLET | Freq: Four times a day (QID) | ORAL | Status: DC | PRN

## 2022-02-06 MED ORDER — AMLODIPINE BESYLATE 5 MG PO TABS
10.0000 mg | ORAL_TABLET | Freq: Every day | ORAL | Status: DC
  Administered 2022-02-06: 10 mg via ORAL
  Filled 2022-02-06: qty 2

## 2022-02-06 MED ORDER — METOPROLOL SUCCINATE ER 50 MG PO TB24: 200.0000 mg | ORAL_TABLET | Freq: Every day | ORAL | Status: DC

## 2022-02-06 MED ORDER — IOHEXOL 350 MG/ML SOLN
100.0000 mL | Freq: Once | INTRAVENOUS | Status: AC | PRN
Start: 2022-02-06 — End: 2022-02-06
  Administered 2022-02-06: 100 mL via INTRAVENOUS

## 2022-02-06 MED ORDER — MORPHINE SULFATE (PF) 4 MG/ML IV SOLN
4.0000 mg | Freq: Once | INTRAVENOUS | Status: AC
  Administered 2022-02-06: 4 mg via INTRAVENOUS
  Filled 2022-02-06: qty 1

## 2022-02-06 MED ORDER — LABETALOL HCL 5 MG/ML IV SOLN
10.0000 mg | Freq: Once | INTRAVENOUS | Status: AC
  Administered 2022-02-06: 10 mg via INTRAVENOUS

## 2022-02-06 MED ORDER — ASPIRIN 325 MG PO TABS: 325.0000 mg | ORAL_TABLET | Freq: Every day | ORAL | Status: DC

## 2022-02-06 MED ORDER — POTASSIUM CHLORIDE CRYS ER 20 MEQ PO TBCR
40.0000 meq | EXTENDED_RELEASE_TABLET | Freq: Two times a day (BID) | ORAL | Status: DC
Start: 2022-02-06 — End: 2022-02-06
  Administered 2022-02-06: 40 meq via ORAL
  Filled 2022-02-06: qty 2

## 2022-02-06 MED ORDER — CLONIDINE HCL 0.1 MG PO TABS
0.2000 mg | ORAL_TABLET | Freq: Two times a day (BID) | ORAL | Status: DC
Start: 2022-02-06 — End: 2022-02-06

## 2022-02-06 MED ORDER — ONDANSETRON HCL 4 MG/2ML IJ SOLN: 4.0000 mg | Freq: Four times a day (QID) | INTRAMUSCULAR | Status: DC | PRN

## 2022-02-06 NOTE — ED Notes (Signed)
Patient transported to CT 

## 2022-02-06 NOTE — Assessment & Plan Note (Addendum)
Stable and not acutely exacerbated Last known LVEF of 35 to 40% from a 2D echocardiogram which was done in 2021 Continue Toprol, Entresto and furosemide Optimize blood pressure control

## 2022-02-06 NOTE — Assessment & Plan Note (Signed)
Probably viral Supportive care with antiemetics and hydration as tolerated Check and supplement electrolytes

## 2022-02-06 NOTE — Assessment & Plan Note (Signed)
Secondary to GI losses from nausea, vomiting and diarrhea Supplement potassium Check magnesium levels 

## 2022-02-06 NOTE — Assessment & Plan Note (Signed)
BMI 39.53 Complicates overall prognosis and plan Lifestyle modification and exercise has been discussed with patient in detail

## 2022-02-06 NOTE — Assessment & Plan Note (Signed)
Secondary to demand ischemia from hypertensive urgency Troponin is flat Obtain 2D echocardiogram to assess LVEF and rule out regional wall motion abnormality Continue aspirin Cardiology consult

## 2022-02-06 NOTE — ED Triage Notes (Signed)
Pt to ED from home c/o lower mid abd pain, chest pain and side pain since yesterday.  States had a stomach bug and has gotten worse, nausea and vomiting x6 and diarrhea >10x.  Denies urinary changes.

## 2022-02-06 NOTE — Assessment & Plan Note (Signed)
Secondary to GI losses from nausea, vomiting and diarrhea ?Supplement magnesium ?

## 2022-02-06 NOTE — ED Provider Notes (Addendum)
[facility] Provider Note    Event Date/Time   First MD Initiated Contact with Patient 02/06/22 [phone]     (approximate)   History   Abdominal Pain and Chest Pain   HPI  Benjamin Goodman. is a 36 y.o. male with hypertension, chronic systolic heart failure who comes in with concerns for abdominal and chest pain.  Patient reports feeling ill for the past few days.  He reports having 5 episodes of nonbloody nonbilious vomiting and 10 episodes of diarrhea.  He reports that the vomiting has since resolved the diarrhea is still continuing but now he is developed pain in the left side of his abdomen radiating up into his chest.  He reports has not been able to take his blood pressure medicine due to the vomiting.  Patient is on multiple antihypertensive medications.  He does report a mild headache.  He denies any new shortness of breath.  He denies anyone else being sick, denies any drinking of lake water, denies any travel out of the [place]   Physical Exam   Triage Vital Signs: ED Triage Vitals  Enc Vitals Group     BP 02/06/22 0157 (!) 199/132     Pulse Rate 02/06/22 0157 (!) 104     Resp 02/06/22 0157 16     Temp 02/06/22 0157 98.4 F (36.9 C)     Temp Source 02/06/22 0157 Oral     SpO2 02/06/22 0157 96 %     Weight 02/06/22 0156 260 lb (117.9 kg)     Height 02/06/22 0156 5\' 8"  (1.727 m)     Head Circumference --      Peak Flow --      Pain Score 02/06/22 0156 5     Pain Loc --      Pain Edu? --      Excl. in GC? --     Most recent vital signs: Vitals:   02/06/22 0157  BP: (!) 199/132  Pulse: (!) 104  Resp: 16  Temp: 98.4 F (36.9 C)  SpO2: 96%     General: Awake, no distress.  CV:  Good peripheral perfusion.  Resp:  Normal effort.  Abd:  No distention.  Tender on the left side of his abdomen. Other:     ED Results / Procedures / Treatments   Labs (all labs ordered are listed, but only abnormal results are displayed) Labs Reviewed   RESP PANEL BY RT-PCR (FLU A&B, COVID) ARPGX2  GASTROINTESTINAL PANEL BY PCR, STOOL (REPLACES STOOL CULTURE)  C DIFFICILE QUICK SCREEN W PCR REFLEX    CBC  COMPREHENSIVE METABOLIC PANEL  LIPASE, BLOOD  TROPONIN I (HIGH SENSITIVITY)     EKG  My interpretation of EKG:  Sinus rhythm 95 without any ST elevation, T wave inversion in lead V6 with left anterior fascicular block.  Similar T wave inversion as in November 2022  RADIOLOGY I have reviewed the xray personally and interpreted no evidence of widened mediastinum  PROCEDURES:  Critical Care performed: Yes, see critical care procedure note(s)  .Critical Care  Performed by: December 2022, MD Authorized by: Concha Se, MD   Critical care provider statement:    Critical care time (minutes):  30   Critical care was necessary to treat or prevent imminent or life-threatening deterioration of the following conditions:  Cardiac failure   Critical care was time spent personally by me on the following activities:  Development of treatment plan with patient or  surrogate, discussions with consultants, evaluation of patient's response to treatment, examination of patient, ordering and review of laboratory studies, ordering and review of radiographic studies, ordering and performing treatments and interventions, pulse oximetry, re-evaluation of patient's condition and review of old charts .1-3 Lead EKG Interpretation  Performed by: Concha Se, MD Authorized by: Concha Se, MD     Interpretation: normal     ECG rate:  90   ECG rate assessment: normal     Rhythm: sinus rhythm     Ectopy: none     Conduction: normal      MEDICATIONS ORDERED IN ED: Medications  ondansetron (ZOFRAN) injection 4 mg (has no administration in time range)  morphine (PF) 4 MG/ML injection 4 mg (has no administration in time range)  labetalol (NORMODYNE) injection 10 mg (has no administration in time range)     IMPRESSION / MDM / ASSESSMENT AND  PLAN / ED COURSE  I reviewed the triage vital signs and the nursing notes.   Patient's presentation is most consistent with acute presentation with potential threat to life or bodily function.   Patient comes in significantly hypertensive with abdominal and chest pain.  Patient is well-appearing but given the hypertension in conjunction with the symptoms I think would be best to do CT imaging to rule out any evidence of dissection.  We will give a dose of IV labetalol, IV morphine and IV Zofran to help with symptoms.  Will get labs to evaluate for any ACS.  Given the headache will also get CT head to evaluate for intracranial hemorrhage.  Given the continued diarrhea we will send off stool testing, C. difficile although seems overall low risk, COVID testing.  CBC is normal.  Troponin is elevated.  CMP shows low potassium at 2.7 and elevated creatinine at 1.3.  Patient did not have much improvement in blood pressure with the 10 of IV labetalol.  We will give a dose of 20 of IV labetalol while we wait for CT imaging  CT head is negative  CT a is negative of the chest and abdomen  Reevaluated patient his pain is mostly resolved.  His repeat troponin is pending and if uptrending patient to be started on heparin he denies any bleeding issues.  Discussed with patient given electrolyte abnormalities in the setting of significant hypertension with a little AKI and troponin elevation recommended admission.  Patient expressed understanding felt comfortable with this plan  Given troponin is downtrending we will hold off on heparin  The patient is on the cardiac monitor to evaluate for evidence of arrhythmia and/or significant heart rate changes.      FINAL CLINICAL IMPRESSION(S) / ED DIAGNOSES   Final diagnoses:  Hypertensive emergency  AKI (acute kidney injury) (HCC)  Hypomagnesemia  Hypokalemia  Gastroenteritis     Rx / DC Orders   ED Discharge Orders     None        Note:   This document was prepared using Dragon voice recognition software and may include unintentional dictation errors.   Concha Se, MD 02/06/22 [id number]    Concha Se, MD 02/06/22 [phone]

## 2022-02-06 NOTE — H&P (Addendum)
History and Physical    Patient: Benjamin Goodman. ATF:[id number] DOB: May 02, 1986 DOA: 02/06/2022 DOS: the patient was seen and examined on 02/06/2022 PCP: Larae Grooms, NP  Patient coming from: Home  Chief Complaint:  Chief Complaint  Patient presents with   Abdominal Pain   Chest Pain   HPI: Benjamin Goodman. is a 36 y.o. male with medical history significant for morbid obesity, hypertension, sleep apnea who presents to the ER for evaluation of abdominal pain mostly in the left lower quadrant associated with diarrhea, nausea and vomiting. Patient states that his symptoms started 2 days prior to admission.  He had eaten at a restaurant with some other family members but is the only one who got sick.  He has had intractable nausea and vomiting and has been unable to tolerate any oral intake and so was not able to take his antihypertensive medications.  He complained of abdominal pain in the left lower quadrant with radiation to the left lateral chest wall. He presented to the ER early in the morning on the day of his admission with complaints of chest pain over the left anterior chest wall.  He rates his pain a 5 x 10 in intensity at its worst.  Pain is nonradiating and he denies having any associated palpitations, shortness of breath or diaphoresis. He denies having any fever, no chills, no headache, no dizziness, no lightheadedness, no urinary symptoms, no leg swelling, no headache, no blurred vision no focal deficit. Blood pressure was significantly elevated upon arrival to the ER at 199/132. Initial troponin was elevated at 64.  Also noted to have hypokalemia and hypomagnesemia. He received a dose of morphine 4 mg, 30 mg mg of labetalol IV and clonidine 0.2 mg He will be referred to observation status for further evaluation   Review of Systems: As mentioned in the history of present illness. All other systems reviewed and are negative. Past Medical History:  Diagnosis Date    CHF (congestive heart failure) (HCC)    Hypertension    Sleep apnea    Past Surgical History:  Procedure Laterality Date   TONSILLECTOMY     Social History:  reports that he has never smoked. He has never used smokeless tobacco. He reports current drug use. Drug: Marijuana. He reports that he does not drink alcohol.  No Known Allergies  Family History  Problem Relation Age of Onset   Asthma Mother    Cancer Mother    Diabetes Mother    Hyperlipidemia Mother    Hypertension Mother    Hypertension Father    Heart attack Maternal Uncle     Prior to Admission medications   Medication Sig Start Date End Date Taking? Authorizing Provider  acetaminophen (TYLENOL) 325 MG tablet Take 650 mg by mouth every 6 (six) hours as needed for mild pain or headache.    [provider]  amLODipine (NORVASC) 10 MG tablet Take 10 mg by mouth daily. 11/09/21   [provider]  AMLODIPINE BESYLATE PO Take 10 mg by mouth daily.    [provider]  aspirin 325 MG tablet Take 325 mg by mouth daily.  Patient not taking: Reported on 03/05/2021    [provider]  aspirin 81 MG chewable tablet Chew 81 mg by mouth daily.    [provider]  cloNIDine (CATAPRES) 0.1 MG tablet Take 0.2 mg by mouth 2 (two) times daily.    [provider]  cloNIDine (CATAPRES) 0.2 MG tablet Take  0.2 mg by mouth 2 (two) times daily. 11/09/21   [provider]  furosemide (LASIX) 40 MG tablet Take 1 tablet (40 mg total) by mouth daily. 03/28/21   Delma Freeze, FNP  hydrALAZINE (APRESOLINE) 50 MG tablet Take 1 tablet (50 mg total) by mouth 3 (three) times daily. 03/05/21   Delma Freeze, FNP  isosorbide mononitrate (IMDUR) 30 MG 24 hr tablet Take 1 tablet (30 mg total) by mouth daily. 03/28/21   Delma Freeze, FNP  metoprolol (TOPROL XL) 200 MG 24 hr tablet Take 1 tablet (200 mg total) by mouth daily. 10/16/20   Delma Freeze, FNP  Multiple Vitamins-Minerals (MULTIVITAMIN  MEN PO) Take 1 tablet by mouth daily. Patient not taking: Reported on 12/06/2021    [provider]  sacubitril-valsartan (ENTRESTO) 97-103 MG Take 1 tablet by mouth 2 (two) times daily. 04/10/20   Delma Freeze, FNP  sertraline (ZOLOFT) 100 MG tablet Take 1 tablet (100 mg total) by mouth daily. Future refills from PCP 03/28/21   Delma Freeze, FNP  spironolactone (ALDACTONE) 25 MG tablet Take 1 tablet (25 mg total) by mouth daily. 12/06/21   Delma Freeze, FNP    Physical Exam: Vitals:   02/06/22 0615 02/06/22 0630 02/06/22 0645 02/06/22 0700  BP:  (!) 170/113  (!) 170/112  Pulse: 91 83  86  Resp: 15 (!) 23  (!) 28  Temp:   98.3 F (36.8 C)   TempSrc:   Oral   SpO2: 98% 97%  99%  Weight:      Height:       Physical Exam Vitals and nursing note reviewed.  Constitutional:      Appearance: He is obese.  HENT:     Head: Normocephalic and atraumatic.     Mouth/Throat:     Mouth: Mucous membranes are moist.  Cardiovascular:     Rate and Rhythm: Normal rate and regular rhythm.  Pulmonary:     Effort: Pulmonary effort is normal.     Breath sounds: Normal breath sounds.  Abdominal:     General: Bowel sounds are normal.     Palpations: Abdomen is soft.     Comments: Central adiposity  Genitourinary:    Rectum: Normal.  Skin:    General: Skin is warm and dry.  Neurological:     General: No focal deficit present.     Mental Status: He is alert.  Psychiatric:        Mood and Affect: Mood normal.        Behavior: Behavior normal.     Data Reviewed: Data Reviewed: Relevant notes from primary care and specialist visits, past discharge summaries as available in EHR, including Care Everywhere. Prior diagnostic testing as pertinent to current admission diagnoses Updated medications and problem lists for reconciliation ED course, including vitals, labs, imaging, treatment and response to treatment Triage notes, nursing and pharmacy notes and ED provider's  notes Notable results as noted in HPI Labs reviewed.  Troponin 64 >> 59, magnesium 1.6, sodium 139, potassium 2.7, chloride 105, bicarb 27, glucose 119, BUN 14, creatinine 1.32, calcium 8.1, total protein 7.1, albumin 3.8, AST 30, ALT 31, alkaline phosphatase 37, total bilirubin 0.8, lipase 26, white count 5.2, hemoglobin 14.4, hematocrit 43.4, platelet count 155 Respiratory viral panel is negative CT scan of the head without contrast shows no acute findings CT angio of chest/abdomen/pelvis Negative CTA of the aorta.  No acute aortic syndrome. No acute finding in the chest  or abdomen. Chest x-ray reviewed by me shows no evidence of acute pulmonary disease There are no new results to review at this time.  Assessment and Plan: Hypertensive urgency Secondary to medication noncompliance Patient received IV labetalol and clonidine with improvement in his blood pressure Resume oral antihypertensive medication  Gastroenteritis Probably viral Supportive care with antiemetics and hydration as tolerated Check and supplement electrolytes  Chronic combined systolic and diastolic CHF (congestive heart failure) (HCC) Stable and not acutely exacerbated Last known LVEF of 35 to 40% from a 2D echocardiogram which was done in 2021 Continue Toprol, Entresto and furosemide Optimize blood pressure control  Hypomagnesemia Secondary to GI losses from nausea, vomiting and diarrhea Supplement magnesium  Hypokalemia Secondary to GI losses from nausea, vomiting and diarrhea Supplement potassium Check magnesium levels  Sleep apnea Secondary to morbid obesity Continue CPAP at bedtime  Elevated troponin Secondary to demand ischemia from hypertensive urgency Troponin is flat Obtain 2D echocardiogram to assess LVEF and rule out regional wall motion abnormality Continue aspirin Cardiology consult  Obesity, Class III, BMI 40-49.9 (morbid obesity) (HCC) BMI 39.53 Complicates overall prognosis and  plan Lifestyle modification and exercise has been discussed with patient in detail      Advance Care Planning:   Code Status: Full Code   Consults: Cardiology  Family Communication: Greater than 50% of time was spent discussing patient's condition and plan of care with him at the bedside.  All questions and concerns have been addressed.  He verbalizes understanding and agrees with the plan.  Severity of Illness: The appropriate patient status for this patient is OBSERVATION. Observation status is judged to be reasonable and necessary in order to provide the required intensity of service to ensure the patient's safety. The patient's presenting symptoms, physical exam findings, and initial radiographic and laboratory data in the context of their medical condition is felt to place them at decreased risk for further clinical deterioration. Furthermore, it is anticipated that the patient will be medically stable for discharge from the hospital within 2 midnights of admission.   Author: Lucile Shutters, MD 02/06/2022 11:25 AM  For on call review [email or web address].

## 2022-02-06 NOTE — ED Notes (Signed)
EDP informed pt BP did not respond to IV labetalol. Pending new orders

## 2022-02-06 NOTE — Assessment & Plan Note (Signed)
Secondary to medication noncompliance Patient received IV labetalol and clonidine with improvement in his blood pressure Resume oral antihypertensive medication

## 2022-02-06 NOTE — ED Notes (Signed)
Pt brought back from main lobby to assigned room 14. Continues to c/o mid abdominal pain, chest pain, and headache that has been ongoing since yesterday along with NVD since Sunday. No Diarrhea since arrival to ED. Per note elevated BP IV. Pt states complain with BP medications when "I remember." Son at bedside.

## 2022-02-06 NOTE — Consult Note (Signed)
[facility] CARDIOLOGY CONSULT NOTE       Patient ID: Benjamin Goodman. MRN: [id number] DOB/AGE: Apr 07, 1986 36 y.o.  Admit date: 02/06/2022 Referring Physician Dr. Joylene Igo Primary Physician Dr. Glenna Fellows Primary Cardiologist Dr. Darrold Junker Reason for Consultation elevated troponin  HPI: Benjamin, Goodman. is a 36 year old male with a past medical history of NICM (LVEF 35-40%, G3 DD 09/2019), hypertension, CKD, morbid obesity, OSA on CPAP who presented to [facility] ED 02/06/2022 with nausea, vomiting, abdominal pain with radiation to his left chest wall.  Cardiology is consulted due to his elevated troponin.  The patient presents with his fiance who contributes to the history.  He says starting on Monday he started having multiple episodes of nausea, vomiting and diarrhea with associated left-sided abdominal pain that radiated up into his left chest, following that evening his vomiting somewhat improved but continued to have significant diarrhea.  He describes this chest pain as a pressure in the center of his chest, 4 out of 10 at greatest, without associated diaphoresis, somewhat relieved with repositioning of his left arm and morphine.  Nitroglycerin did not help. The symptoms continued throughout the day on Tuesday so he decided to present to the ED.  He was unable to keep down any of his home medicines, including his antihypertensives due to the vomiting.  He had also recently run out of a couple of his medicines, but is unsure of which one specifically, or which ones he was actually taking.  He denies any shortness of breath, palpitations, dizziness, lower extremity swelling.  He has chronic orthopnea that has been somewhat worsening over the past several months, sleeps with 3 or 4 pillows underneath him (used to be 1 or 2) compliant with the CPAP at night.  He works from 3 PM to 12 AM and notes that his blood pressure medicines make him feel very tired and sometimes struggles to figure out  a good time to take them due to his work schedule.  He is a non-smoker, drinks 1 serving of alcohol monthly, smokes marijuana once weekly.  He had an exercise Myoview 11/2019 that showed mildly reduced LVEF with inferoapical wall hypokinesis without definitive evidence of scar or ischemia.  His vitals are notable for a blood pressure of initially of 173/125, mildly improved to 160/110 during interview.  Heart rate 86 in sinus rhythm on telemetry.  Labs are notable for significant hypokalemia with potassium 2.7, magnesium level 1.6, BUN/creatinine 14/1.32, GFR greater than 60.  Troponin minimally elevated and flat trending at 64-59, no leukocytosis, H&H 14/43.  Respiratory panel negative for COVID or flu, CTA chest negative for acute abnormality in the chest or abdomen, CT head negative.  Chest x-ray without active disease.  EKG shows sinus rhythm with poor R wave progression, inverted T waves in aVL and V6, without significant change from prior from 04/2021.   Review of systems complete and found to be negative unless listed above   Past Medical History:  Diagnosis Date   CHF (congestive heart failure) (HCC)    Hypertension    Sleep apnea     Past Surgical History:  Procedure Laterality Date   TONSILLECTOMY      (Not in a hospital admission)  Social History   Socioeconomic History   Marital status: Single    Spouse name: Not on file   Number of children: Not on file   Years of education: Not on file   Highest education level: Not on file  Occupational History  Not on file  Tobacco Use   Smoking status: Never   Smokeless tobacco: Never  Vaping Use   Vaping Use: Never used  Substance and Sexual Activity   Alcohol use: No   Drug use: Yes    Types: Marijuana   Sexual activity: Yes  Other Topics Concern   Not on file  Social History Narrative   Not on file   Social Determinants of Health   Financial Resource Strain: High Risk (12/06/2021)   Overall Financial Resource Strain  (CARDIA)    Difficulty of Paying Living Expenses: Hard  Food Insecurity: Food Insecurity Present (12/06/2021)   Hunger Vital Sign    Worried About Running Out of Food in the Last Year: Sometimes true    Ran Out of Food in the Last Year: Sometimes true  Transportation Needs: No Transportation Needs (12/06/2021)   PRAPARE - Administrator, Civil Service (Medical): No    Lack of Transportation (Non-Medical): No  Physical Activity: Not on file  Stress: Not on file  Social Connections: Not on file  Intimate Partner Violence: Not on file    Family History  Problem Relation Age of Onset   Asthma Mother    Cancer Mother    Diabetes Mother    Hyperlipidemia Mother    Hypertension Mother    Hypertension Father    Heart attack Maternal Uncle       PHYSICAL EXAM General: Pleasant black male, well nourished, in no acute distress.  Sitting upright in ED stretcher with fianc at bedside HEENT:  Normocephalic and atraumatic. Neck:  No JVD.  Lungs: Normal respiratory effort on room air.  Decreased breath sounds bilaterally.   Heart: HRRR . Normal S1 and S2 without gallops or murmurs.  Chest: Tenderness to palpation along his anterior chest wall, worst at the the left lower sternal border Abdomen: Obese appearing.  Msk: Normal strength and tone for age. Extremities: Warm and well perfused. No clubbing, cyanosis.  No peripheral edema.  Neuro: Alert and oriented X 3. Psych:  Answers questions appropriately.   Labs: Basic Metabolic Panel: Recent Labs    02/06/22 0257  NA 139  K 2.7*  CL 105  CO2 27  GLUCOSE 119*  BUN 14  CREATININE 1.32*  CALCIUM 8.1*  MG 1.6*   Liver Function Tests: Recent Labs    02/06/22 0257  AST 30  ALT 31  ALKPHOS 37*  BILITOT 0.8  PROT 7.1  ALBUMIN 3.8   Recent Labs    02/06/22 0257  LIPASE 26   CBC: Recent Labs    02/06/22 0257  WBC 5.2  HGB 14.4  HCT 43.4  MCV 83.1  PLT 155   Cardiac Enzymes: Recent Labs     02/06/22 0257 02/06/22 0447  TROPONINIHS 64* 59*   BNP: Invalid input(s): "POCBNP" D-Dimer: No results for input(s): "DDIMER" in the last 72 hours. Hemoglobin A1C: No results for input(s): "HGBA1C" in the last 72 hours. Fasting Lipid Panel: No results for input(s): "CHOL", "HDL", "LDLCALC", "TRIG", "CHOLHDL", "LDLDIRECT" in the last 72 hours. Thyroid Function Tests: No results for input(s): "TSH", "T4TOTAL", "T3FREE", "THYROIDAB" in the last 72 hours.  Invalid input(s): "FREET3" Anemia Panel: No results for input(s): "VITAMINB12", "FOLATE", "FERRITIN", "TIBC", "IRON", "RETICCTPCT" in the last 72 hours.  CT HEAD WO CONTRAST ( )  Result Date: 02/06/2022 CLINICAL DATA:  Classic migraine EXAM: CT HEAD WITHOUT CONTRAST TECHNIQUE: Contiguous axial images were obtained from the base of the skull through the vertex without intravenous  contrast. RADIATION DOSE REDUCTION: This exam was performed according to the departmental dose-optimization program which includes automated exposure control, adjustment of the mA and/or kV according to patient size and/or use of iterative reconstruction technique. COMPARISON:  01/18/2014 FINDINGS: Brain: No evidence of acute infarction, hemorrhage, hydrocephalus, extra-axial collection or mass lesion/mass effect. Vascular: No hyperdense vessel or unexpected calcification. Skull: Normal. Negative for fracture or focal lesion. Sinuses/Orbits: No acute finding. IMPRESSION: Negative head CT. Electronically Signed   By: Tiburcio Pea M.D.   On: 02/06/2022 04:47   CT Angio Chest/Abd/Pel for Dissection W and/or Wo Contrast  Result Date: 02/06/2022 CLINICAL DATA:  Chest or back pain with aortic dissection suspected EXAM: CT ANGIOGRAPHY CHEST, ABDOMEN AND PELVIS TECHNIQUE: Non-contrast CT of the chest was initially obtained. Multidetector CT imaging through the chest, abdomen and pelvis was performed using the standard protocol during bolus administration of intravenous  contrast. Multiplanar reconstructed images and MIPs were obtained and reviewed to evaluate the vascular anatomy. RADIATION DOSE REDUCTION: This exam was performed according to the departmental dose-optimization program which includes automated exposure control, adjustment of the mA and/or kV according to patient size and/or use of iterative reconstruction technique. CONTRAST:  OMNIPAQUE IOHEXOL 350 MG/ML SOLN COMPARISON:  01/23/2017 chest CTA FINDINGS: CTA CHEST FINDINGS Cardiovascular: Preferential opacification of the thoracic aorta. No evidence of thoracic aortic aneurysm or dissection. Generous heart size and left ventricular thickness, there is history of hypertension and CHF no pericardial effusion. Mediastinum/Nodes: Negative for adenopathy or mass. Lungs/Pleura: There is no edema, consolidation, effusion, or pneumothorax. Musculoskeletal: No acute or aggressive finding Review of the MIP images confirms the above findings. CTA ABDOMEN AND PELVIS FINDINGS VASCULAR Aorta: Normal Celiac: Normal SMA: Normal Renals: Early branching on the right. Single bilateral renal arteries. Vessels are smooth and widely patent. IMA: Patent Inflow: Unremarkable Veins: Negative in the arterial phase Review of the MIP images confirms the above findings. NON-VASCULAR Hepatobiliary: No focal liver abnormality.No evidence of biliary obstruction or stone. Pancreas: Unremarkable. Spleen: Unremarkable. Adrenals/Urinary Tract: Negative adrenals. No hydronephrosis or stone. Unremarkable bladder. Stomach/Bowel:  No obstruction. No appendicitis. Lymphatic: No mass or adenopathy. Reproductive:No pathologic findings. Other: No ascites or pneumoperitoneum. Musculoskeletal: No acute abnormalities. Review of the MIP images confirms the above findings. IMPRESSION: 1. Negative CTA of the aorta.  No acute aortic syndrome. 2. No acute finding in the chest or abdomen. Electronically Signed   By: Tiburcio Pea M.D.   On: 02/06/2022 04:45    DG Chest Portable 1 View  Result Date: 02/06/2022 CLINICAL DATA:  Chest pain EXAM: PORTABLE CHEST 1 VIEW COMPARISON:  04/11/2021 FINDINGS: Cardiac shadow is prominent but stable. Lungs are clear. No bony abnormality is noted. IMPRESSION: No active disease. Electronically Signed   By: Alcide Clever M.D.   On: 02/06/2022 02:41     Radiology: CT HEAD WO CONTRAST ( )  Result Date: 02/06/2022 CLINICAL DATA:  Classic migraine EXAM: CT HEAD WITHOUT CONTRAST TECHNIQUE: Contiguous axial images were obtained from the base of the skull through the vertex without intravenous contrast. RADIATION DOSE REDUCTION: This exam was performed according to the departmental dose-optimization program which includes automated exposure control, adjustment of the mA and/or kV according to patient size and/or use of iterative reconstruction technique. COMPARISON:  01/18/2014 FINDINGS: Brain: No evidence of acute infarction, hemorrhage, hydrocephalus, extra-axial collection or mass lesion/mass effect. Vascular: No hyperdense vessel or unexpected calcification. Skull: Normal. Negative for fracture or focal lesion. Sinuses/Orbits: No acute finding. IMPRESSION: Negative head CT. Electronically Signed  By: Tiburcio PeaJonathan  Watts M.D.   On: 02/06/2022 04:47   CT Angio Chest/Abd/Pel for Dissection W and/or Wo Contrast  Result Date: 02/06/2022 CLINICAL DATA:  Chest or back pain with aortic dissection suspected EXAM: CT ANGIOGRAPHY CHEST, ABDOMEN AND PELVIS TECHNIQUE: Non-contrast CT of the chest was initially obtained. Multidetector CT imaging through the chest, abdomen and pelvis was performed using the standard protocol during bolus administration of intravenous contrast. Multiplanar reconstructed images and MIPs were obtained and reviewed to evaluate the vascular anatomy. RADIATION DOSE REDUCTION: This exam was performed according to the departmental dose-optimization program which includes automated exposure control, adjustment of the mA  and/or kV according to patient size and/or use of iterative reconstruction technique. CONTRAST:  100mL OMNIPAQUE IOHEXOL 350 MG/ML SOLN COMPARISON:  01/23/2017 chest CTA FINDINGS: CTA CHEST FINDINGS Cardiovascular: Preferential opacification of the thoracic aorta. No evidence of thoracic aortic aneurysm or dissection. Generous heart size and left ventricular thickness, there is history of hypertension and CHF no pericardial effusion. Mediastinum/Nodes: Negative for adenopathy or mass. Lungs/Pleura: There is no edema, consolidation, effusion, or pneumothorax. Musculoskeletal: No acute or aggressive finding Review of the MIP images confirms the above findings. CTA ABDOMEN AND PELVIS FINDINGS VASCULAR Aorta: Normal Celiac: Normal SMA: Normal Renals: Early branching on the right. Single bilateral renal arteries. Vessels are smooth and widely patent. IMA: Patent Inflow: Unremarkable Veins: Negative in the arterial phase Review of the MIP images confirms the above findings. NON-VASCULAR Hepatobiliary: No focal liver abnormality.No evidence of biliary obstruction or stone. Pancreas: Unremarkable. Spleen: Unremarkable. Adrenals/Urinary Tract: Negative adrenals. No hydronephrosis or stone. Unremarkable bladder. Stomach/Bowel:  No obstruction. No appendicitis. Lymphatic: No mass or adenopathy. Reproductive:No pathologic findings. Other: No ascites or pneumoperitoneum. Musculoskeletal: No acute abnormalities. Review of the MIP images confirms the above findings. IMPRESSION: 1. Negative CTA of the aorta.  No acute aortic syndrome. 2. No acute finding in the chest or abdomen. Electronically Signed   By: Tiburcio PeaJonathan  Watts M.D.   On: 02/06/2022 04:45   DG Chest Portable 1 View  Result Date: 02/06/2022 CLINICAL DATA:  Chest pain EXAM: PORTABLE CHEST 1 VIEW COMPARISON:  04/11/2021 FINDINGS: Cardiac shadow is prominent but stable. Lungs are clear. No bony abnormality is noted. IMPRESSION: No active disease. Electronically Signed    By: Alcide CleverMark  Lukens M.D.   On: 02/06/2022 02:41    11/15/2019 treadmill Myoview Impression   1.  Mildly reduced left ventricular function  2.  Inferoapical wall hypokinesis  3.  No definitive evidence for scar or ischemia Narrative  CARDIOLOGY DEPARTMENT  [facility]  [address], [place], [place]  [id number]  [phone]   Procedure: Exercise Myocardial Perfusion Imaging    ONE day procedure   Indication: Other chest pain  Plan: NM myocardial perfusion SPECT multiple (stress        and rest), ECG stress test only   Elevated troponin  Plan: NM myocardial perfusion SPECT multiple (stress        and rest), ECG stress test only   Cardiomyopathy of undetermined type (CMS-HCC)  Plan: NM myocardial perfusion SPECT multiple (stress        and rest), ECG stress test only   Ordering Physician:   Dr. Marcina MillardAlexander [name]    Clinical History:  36 y.o. year old male  Vitals: Height: 68 in  Weight: 262 lb  Cardiac risk factors include:     CHF, HTN and Obesity    Procedure:  The patient performed treadmill exercise using  a Bruce protocol for 7:21  minutes. The exercise test was stopped due to fatigue.  Blood pressure  response was hypertensive. The patient developed symptoms other than  fatigue during the procedure; specific symptoms included SOB AND CHEST  PAINS   Rest HR: 82bpm  Rest BP: 144/35mmHg  Max HR: 169bpm  Max BP: 210/37mmHg  Mets:     9.00  % MAX HR:   89%   Stress Test Administered by: Percell Boston, CMA   ECG Interpretation:  Rest ECG:  normal sinus rhythm, none  Stress ECG:  normal sinus rhythm,  Recovery ECG:  normal sinus rhythm  ECG Interpretation:  negative, nondiagnostic changes.    Administrations This Visit     technetium Tc61m sestamibi (CARDIOLITE) injection 11.26 millicurie     Admin Date  11/15/2019 Action  Given Dose  11.26 millicurie Route  Intravenous Administered By  Titus Dubin, CNMT      technetium Tc63m sestamibi (CARDIOLITE) injection 31.99 millicurie     Admin Date  11/15/2019 Action  Given Dose  31.99 millicurie Route  Intravenous Administered By  Titus Dubin, CNMT   Gated post-stress perfusion imaging was performed 30 minutes after stress.  Rest images were performed 30 minutes after injection.   Gated LV Analysis:   Summary of LV Perfusion: Normal   Summary of LV Function: Abnormal   TID:  1.03   LVEF= 43%   FINDINGS:  Regional wall motion:  demonstrates  hypokinesis of the inferior apical  walls..  The overall quality of the study is good.    Artifacts noted: no  Left ventricular cavity: enlarged.   ECHO 09/14/2019 LVEF 35 to 40%, global hypokinesis, G3 DD  TELEMETRY reviewed by me (LT) 02/06/2022 : Sinus rhythm rate 80s  EKG reviewed by me: sinus rhythm with poor R wave progression, inverted T waves in aVL and V6, without significant change from prior from 04/2021.  Data reviewed by me (LT) 02/06/2022: ED note, admission H&P, last cardiology note with Dr. Darrold Junker, last heart failure clinic note, CBC, CMP, troponin, respiratory panel, chest x-ray, EKG from 8/30 and 04/2021  ASSESSMENT AND PLAN:  Benjamin, Goodman. is a 36 year old male with a past medical history of NICM (LVEF 35-40%), hypertension, CKD, morbid obesity, OSA on CPAP who presented to [facility] ED 02/06/2022 with nausea, vomiting, abdominal pain with radiation to his left chest wall.  Cardiology is consulted due to his elevated troponin.  #Elevated troponin #GI illness #Hypertensive urgency Patient presents with 3 days of nausea, vomiting, and profound diarrhea with associated left-sided abdominal pain that radiates into his chest.  His chest discomfort he describes as a 4/10 at its greatest, somewhat relieved with repositioning of his left arm and IV morphine.  He has reproducible chest wall pain to palpation along the left lower sternal border.  he has not been able to take any of his  home medicines due to his vomiting, and also had run out of a couple of them (unsure which one specifically).  His troponin was minimally elevated and flat trending at 64-59, likely demand from severely elevated blood pressure of 170/125 and not ACS.  EKG shows sinus rhythm without acute ST changes, very similar to prior study from 04/29/2021. -S/p IV morphine 4 mg x 1, Zofran 4 mg x 1 with symptom improvement -S/p labetalol 10 mg x 1, 20 mg x 1 IV, clonidine 0.2 mg p.o. with minimal improvement in his blood pressure -Restart home antihypertensives amlodipine 10 mg, hydralazine  50 mg 3 times daily, metoprolol XL 200 milligrams once daily, Entresto 97-103 mg twice daily, spironolactone 25 mg once daily -Hold further doses of Lasix  -Discussed the importance of follow-up appointments and medication compliance -discussed keeping a blood pressure diary  -echocardiogram complete -He will need close follow-up with his regular cardiologist Dr. Darrold Junker in 1 to 2 weeks.  #NICM (LVEF 35-40%, G3 DD, 09/2019) Unclear etiology, possibly idiopathic versus hypertensive.  Appears clinically euvolemic, if not dry in the setting of his GI illness. -continue aspirin  -Continue aggressive blood pressure control as above, and GDMT with metoprolol 200 mg XL once daily, Entresto 97-103, spironolactone 25 mg once daily, hydralazine 50 mg 3 times daily -Consider addition of Jardiance on an outpatient basis  #Hypokalemia, hypomagnesemia In the setting of his GI illness as above, potassium is 2.7, mag 1.6 on admission, monitor and replete as necessary for ideally a magnesium >2 and potassium >4  This patient's plan of care was discussed and created with Dr. Juliann Pares and he is in agreement.  Signed: Rebeca Allegra , PA-C 02/06/2022, 9:18 AM [facility]

## 2022-02-06 NOTE — Assessment & Plan Note (Signed)
Secondary to morbid obesity Continue CPAP at bedtime 

## 2022-03-11 ENCOUNTER — Emergency Department: Payer: BC Managed Care – PPO

## 2022-03-11 ENCOUNTER — Emergency Department
Admission: EM | Admit: 2022-03-11 | Discharge: 2022-03-12 | Disposition: A | Discharge status: Home / Self Care | Payer: BC Managed Care – PPO | Attending: Emergency Medicine | Admitting: Emergency Medicine

## 2022-03-11 ENCOUNTER — Encounter: Payer: Self-pay | Admitting: Emergency Medicine

## 2022-03-11 DIAGNOSIS — E876 Hypokalemia: Secondary | ICD-10-CM | POA: Diagnosis not present

## 2022-03-11 DIAGNOSIS — I110 Hypertensive heart disease with heart failure: Secondary | ICD-10-CM | POA: Diagnosis not present

## 2022-03-11 DIAGNOSIS — I509 Heart failure, unspecified: Secondary | ICD-10-CM | POA: Insufficient documentation

## 2022-03-11 DIAGNOSIS — R55 Syncope and collapse: Secondary | ICD-10-CM | POA: Diagnosis present

## 2022-03-11 LAB — CBC
HCT: 43.8 % (ref 39.0–52.0)
Hemoglobin: 14.1 g/dL (ref 13.0–17.0)
MCH: 27.4 pg (ref 26.0–34.0)
MCHC: 32.2 g/dL (ref 30.0–36.0)
MCV: 85 fL (ref 80.0–100.0)
Platelets: 208 10*3/uL (ref 150–400)
RBC: 5.15 MIL/uL (ref 4.22–5.81)
RDW: 15 % (ref 11.5–15.5)
WBC: 8 10*3/uL (ref 4.0–10.5)
nRBC: 0 % (ref 0.0–0.2)

## 2022-03-11 LAB — BASIC METABOLIC PANEL
Anion gap: 6 (ref 5–15)
BUN: 9 mg/dL (ref 6–20)
CO2: 27 mmol/L (ref 22–32)
Calcium: 8.5 mg/dL — ABNORMAL LOW (ref 8.9–10.3)
Chloride: 105 mmol/L (ref 98–111)
Creatinine, Ser: 1.01 mg/dL (ref 0.61–1.24)
GFR, Estimated: >60 mL/min (ref >60)
Glucose, Bld: 114 mg/dL — ABNORMAL HIGH (ref 70–99)
Potassium: 3 mmol/L — ABNORMAL LOW (ref 3.5–5.1)
Sodium: 138 mmol/L (ref 135–145)

## 2022-03-11 LAB — TROPONIN I (HIGH SENSITIVITY)
Troponin I (High Sensitivity): 16 ng/L (ref <18)
Troponin I (High Sensitivity): 16 ng/L (ref <18)

## 2022-03-11 MED ORDER — POTASSIUM CHLORIDE CRYS ER 20 MEQ PO TBCR
40.0000 meq | EXTENDED_RELEASE_TABLET | Freq: Once | ORAL | Status: AC
  Administered 2022-03-12: 40 meq via ORAL
  Filled 2022-03-11: qty 2

## 2022-03-11 MED ORDER — ACETAMINOPHEN 500 MG PO TABS
1000.0000 mg | ORAL_TABLET | Freq: Once | ORAL | Status: AC
  Administered 2022-03-12: 1000 mg via ORAL
  Filled 2022-03-11: qty 2

## 2022-03-11 NOTE — Discharge Instructions (Addendum)
I have sent refills of your prescriptions to your pharmacy.  Please follow-up with your cardiologist.  If you have recurrent episodes of passing out your chest pain worsens or you develop any new symptoms that are concerning to you please return to the emergency department.

## 2022-03-11 NOTE — ED Triage Notes (Signed)
Pt presents via POV with complaints of syncopal episode at work tonight. Pt states feeling dizzy and waking up on the floor - he notes having a headache. Per Spouse, the patient takes a blood thinner (unknown name) and ASA daily.  Denies CP or SOB.

## 2022-03-11 NOTE — ED Provider Notes (Signed)
[facility] Provider Note    Event Date/Time   First MD Initiated Contact with Patient 03/11/22 2258     (approximate)   History   Near Syncope   HPI  Benjamin Goodman. is a 36 y.o. male past medical history of CHF, hypertension, sleep apnea who presents after syncopal episode.  Patient was at work he was bending down when he suddenly felt unwell and lightheaded and extremely drained like he had no energy and then fell backwards.  When he woke up he felt pressure in the center of his chest and had pain in the back of his head.  Continues to have some pressure in the chest and head pain denies nausea vomiting abdominal pain diarrhea.  Denies shortness of breath.The patient denies hx of prior DVT/PE, unilateral leg pain/swelling, hormone use, recent surgery, hx of cancer, prolonged immobilization, or hemoptysis.  Patient has no history of syncope.  Said he was feeling fine prior to the episode.  Denies palpitations.  Has had chest pain multiple times in the past this feels similar.     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   Cardiomyopathy of undetermined type ([place]) 10/14/2019   Chest pain 09/13/2019   Essential hypertension 09/13/2019   Obesity, Class III, BMI 40-49.9 (morbid obesity) ([place]) 09/13/2019   Elevated troponin 09/13/2019   Hypertensive emergency 09/13/2019   Hypertensive urgency 09/13/2019   Acute respiratory failure with hypoxia ([place]) 01/23/2017     Physical Exam  Triage Vital Signs: ED Triage Vitals [03/11/22 2057]  Enc Vitals Group     BP (!) 156/102     Pulse Rate 80     Resp 18     Temp 98 F (36.7 C)     Temp Source Oral      SpO2 98 %     Weight 262 lb (118.8 kg)     Height 5\' 8"  (1.727 m)     Head Circumference      Peak Flow      Pain Score 7     Pain Loc      Pain Edu?      Excl. in [place]?     Most recent vital signs: Vitals:   03/11/22 2232 03/11/22 2330  BP: (!) 153/98 (!) 143/100  Pulse: 74 71  Resp: 17 12  Temp: 97.7 F (36.5 C)   SpO2: 99%      General: Awake, no distress.  CV:  Good peripheral perfusion. No edema Resp:  Normal effort. Lungs are clear Abd:  No distention.  Neuro:             Awake, Alert, Oriented x 3  Other:  No signs of trauma to head or neck, no C spine ttp   ED Results / Procedures / Treatments  Labs (all labs ordered are listed, but only abnormal results are displayed) Labs Reviewed  BASIC METABOLIC PANEL - Abnormal; Notable for the following components:      Result Value   Potassium 3.0 (*)    Glucose, Bld 114 (*)    Calcium 8.5 (*)  All other components within normal limits  CBC  URINALYSIS, ROUTINE W REFLEX MICROSCOPIC  CBG MONITORING, ED  TROPONIN I (HIGH SENSITIVITY)  TROPONIN I (HIGH SENSITIVITY)     EKG  EKG reviewed interpreted myself shows normal sinus rhythm, T wave inversion 1 aVL V6 similar to prior normal axis normal intervals   RADIOLOGY I reviewed and interpreted the CXR which does not show any acute cardiopulmonary process    PROCEDURES:  Critical Care performed: No  .1-3 Lead EKG Interpretation  Performed by: Rada Hay, MD Authorized by: Rada Hay, MD     Interpretation: normal     ECG rate assessment: normal     Rhythm: sinus rhythm     Ectopy: none     Conduction: normal     The patient is on the cardiac monitor to evaluate for evidence of arrhythmia and/or significant heart rate changes.   MEDICATIONS ORDERED IN ED: Medications  acetaminophen (TYLENOL) tablet 1,000 mg (has no administration in time range)  potassium chloride SA (KLOR-CON M) CR tablet 40 mEq (has no administration in time  range)     IMPRESSION / MDM / ASSESSMENT AND PLAN / ED COURSE  I reviewed the triage vital signs and the nursing notes.                              Patient's presentation is most consistent with acute presentation with potential threat to life or bodily function.  Differential diagnosis includes, but is not limited to, vasovagal syncope, orthostatic syncope, cardiac arrhythmia, coronary syndrome, pulmonary embolism  Patient is a 36 year old male with history of nonischemic cardiomyopathy who presents after a syncopal episode.  This occurred while he was at work he was bending down fixing something when he suddenly felt lightheaded and felt drained globally.  He then fell backward and lost consciousness.  He had no preceding chest pain but did feel chest pressure when he woke up.  He has had similar chest pressure in the past was admitted for this at the end of August had troponins trended and saw cardiology felt that his troponins were elevated in the setting of hypertension.  Patient's labs are overall reassuring today.  He is mildly hypokalemic with potassium of 3 and this is an ongoing issue for him.  This was supplemented orally in the ED.  Troponins now are negative.  Blood pressure is elevated 140/100 rest of his vitals are reassuring.  Overall he looks well he does not appear volume overloaded.  Does have mild headache no signs of head trauma CT head and C-spine obtained from triage are negative.  Chest x-ray is clear.  EKG shows T wave inversions that are chronic no new changes.  Overall this sounds to me like benign cause of syncope given the prodrome.  Did consider acute coronary syndrome pulmonary embolism given the chest pain however he has had similar chest pain in the past and troponins are negative today.  He is PERC negative and low risk for pulmonary embolism.  Patient is out of his blood pressure medications so I will send these to his pharmacy.  Encouraged follow-up with heart  failure clinic and his cardiologist.  Encouraged return to ED for any new or worsening symptoms recurrent syncope worsening chest pain etc.       FINAL CLINICAL IMPRESSION(S) / ED DIAGNOSES   Final diagnoses:  Syncope and collapse  Hypokalemia     Rx /  DC Orders   ED Discharge Orders          Ordered    cloNIDine (CATAPRES) 0.2 MG tablet  2 times daily        03/12/22 0001    furosemide (LASIX) 40 MG tablet  Daily        03/12/22 0001    hydrALAZINE (APRESOLINE) 50 MG tablet  3 times daily        03/12/22 0001    isosorbide mononitrate (IMDUR) 30 MG 24 hr tablet  Daily        03/12/22 0001    metoprolol (TOPROL XL) 200 MG 24 hr tablet  Daily       Note to Pharmacy: D/C tartrate   03/12/22 0001    sacubitril-valsartan (ENTRESTO) 97-103 MG  2 times daily       Note to Pharmacy: D/C entresto 49/51mg    03/12/22 0001    spironolactone (ALDACTONE) 25 MG tablet  Daily        03/12/22 0001    amLODipine (NORVASC) 10 MG tablet  Daily        03/12/22 0001             Note:  This document was prepared using Dragon voice recognition software and may include unintentional dictation errors.   Rada Hay, MD 03/12/22 0001

## 2022-03-11 NOTE — ED Notes (Signed)
Pt reports a syncopal episode today at work  pt states he has a headache, neck pain, chest pain.    No n/v/d.  No dizziness now  pt alert  speech clear.  Family with pt.

## 2022-03-12 MED ORDER — FUROSEMIDE 40 MG PO TABS: 40.0000 mg | ORAL_TABLET | Freq: Every day | ORAL | 0 refills | Status: AC

## 2022-03-12 MED ORDER — HYDRALAZINE HCL 50 MG PO TABS: 50.0000 mg | ORAL_TABLET | Freq: Three times a day (TID) | ORAL | 3 refills | Status: AC

## 2022-03-12 MED ORDER — CLONIDINE HCL 0.2 MG PO TABS: 0.2000 mg | ORAL_TABLET | Freq: Two times a day (BID) | ORAL | 0 refills | Status: DC

## 2022-03-12 MED ORDER — SPIRONOLACTONE 25 MG PO TABS: 25.0000 mg | ORAL_TABLET | Freq: Every day | ORAL | 3 refills | Status: DC

## 2022-03-12 MED ORDER — SACUBITRIL-VALSARTAN 97-103 MG PO TABS: 1.0000 | ORAL_TABLET | Freq: Two times a day (BID) | ORAL | 3 refills | Status: DC

## 2022-03-12 MED ORDER — METOPROLOL SUCCINATE ER 200 MG PO TB24: 200.0000 mg | ORAL_TABLET | Freq: Every day | ORAL | 3 refills | Status: DC

## 2022-03-12 MED ORDER — AMLODIPINE BESYLATE 10 MG PO TABS: 10.0000 mg | ORAL_TABLET | Freq: Every day | ORAL | 0 refills | Status: DC

## 2022-03-12 MED ORDER — ISOSORBIDE MONONITRATE ER 30 MG PO TB24: 30.0000 mg | ORAL_TABLET | Freq: Every day | ORAL | 3 refills | Status: DC

## 2022-03-22 ENCOUNTER — Encounter: Payer: Self-pay | Admitting: Nurse Practitioner

## 2022-03-22 ENCOUNTER — Ambulatory Visit (INDEPENDENT_AMBULATORY_CARE_PROVIDER_SITE_OTHER): Payer: BC Managed Care – PPO | Admitting: Nurse Practitioner

## 2022-03-22 VITALS — BP 133/86 | HR 84 | Ht 68.0 in | Wt 255.1 lb

## 2022-03-22 DIAGNOSIS — I10 Essential (primary) hypertension: Secondary | ICD-10-CM

## 2022-03-22 DIAGNOSIS — R519 Headache, unspecified: Secondary | ICD-10-CM | POA: Diagnosis not present

## 2022-03-22 DIAGNOSIS — F32A Depression, unspecified: Secondary | ICD-10-CM

## 2022-03-22 DIAGNOSIS — Z6838 Body mass index (BMI) 38.0-38.9, adult: Secondary | ICD-10-CM

## 2022-03-22 DIAGNOSIS — E669 Obesity, unspecified: Secondary | ICD-10-CM | POA: Diagnosis not present

## 2022-03-22 MED ORDER — SERTRALINE HCL 100 MG PO TABS: 100.0000 mg | ORAL_TABLET | Freq: Every day | ORAL | 0 refills | Status: DC

## 2022-03-22 NOTE — Progress Notes (Addendum)
New Patient Office Visit  Subjective    Patient ID: Benjamin Bok., male    DOB: 1986-04-20  Age: 36 y.o. MRN: [id number]  CC:  Chief Complaint  Patient presents with   New Patient (Initial Visit)    HPI Benjamin Goodman. presents to establish care.  His previous PCP was Dr. Selena Batten at [facility] [facility]. Patient has history of hypertension,  He is follwed bt the heart failure clinic. He complain of headache constantly after the fall he had om 03/11/2022. He had CT head and C-spine in the ED which was negative.  Headache  This is a new problem. The current episode started 1 to 4 weeks ago. The problem occurs daily. The problem has been unchanged. The pain is located in the Occipital region. The pain quality is not similar to prior headaches. The quality of the pain is described as squeezing. The pain is at a severity of 6/10. The pain is moderate. Pertinent negatives include no blurred vision, coughing, dizziness, fever, hearing loss or tingling. Exacerbated by: lying down increases it. He has tried acetaminophen, cold packs and NSAIDs for the symptoms. The treatment provided mild relief.    Patient complaint of being fatigued particularly in the afternoon.   Outpatient Encounter Medications as of 03/22/2022  Medication Sig   acetaminophen (TYLENOL) 325 MG tablet Take 2 tablets (650 mg total) by mouth every 6 (six) hours as needed for mild pain or headache.   amLODipine (NORVASC) 10 MG tablet Take 1 tablet (10 mg total) by mouth daily.   aspirin 81 MG chewable tablet Chew 81 mg by mouth daily.   cloNIDine (CATAPRES) 0.2 MG tablet Take 1 tablet (0.2 mg total) by mouth 2 (two) times daily.   furosemide (LASIX) 40 MG tablet Take 1 tablet (40 mg total) by mouth daily.   hydrALAZINE (APRESOLINE) 50 MG tablet Take 1 tablet (50 mg total) by mouth 3 (three) times daily.   isosorbide mononitrate (IMDUR) 30 MG 24 hr tablet Take 1 tablet (30 mg total) by mouth daily.   spironolactone  (ALDACTONE) 25 MG tablet Take 1 tablet (25 mg total) by mouth daily.   [DISCONTINUED] acetaminophen (TYLENOL) 325 MG tablet Take 650 mg by mouth every 6 (six) hours as needed for mild pain or headache.   [DISCONTINUED] sacubitril-valsartan (ENTRESTO) 97-103 MG Take 1 tablet by mouth 2 (two) times daily.   sertraline (ZOLOFT) 100 MG tablet Take 1 tablet (100 mg total) by mouth daily. Future refills from PCP   [DISCONTINUED] aspirin 325 MG tablet Take 325 mg by mouth daily.  (Patient not taking: Reported on 03/05/2021)   [DISCONTINUED] cloNIDine (CATAPRES) 0.1 MG tablet Take 0.2 mg by mouth 2 (two) times daily. (Patient not taking: Reported on 02/06/2022)   [DISCONTINUED] metoprolol (TOPROL XL) 200 MG 24 hr tablet Take 1 tablet (200 mg total) by mouth daily.   [DISCONTINUED] Multiple Vitamins-Minerals (MULTIVITAMIN MEN PO) Take 1 tablet by mouth daily. (Patient not taking: Reported on 12/06/2021)   [DISCONTINUED] sertraline (ZOLOFT) 100 MG tablet Take 1 tablet (100 mg total) by mouth daily. Future refills from PCP (Patient not taking: Reported on 03/22/2022)   No facility-administered encounter medications on file as of 03/22/2022.    Past Medical History:  Diagnosis Date   CHF (congestive heart failure) (HCC)    Headache    Hypertension    Sleep apnea    occass CPAP user    Past Surgical History:  Procedure Laterality Date   TONSILLECTOMY  Family History  Problem Relation Age of Onset   Asthma Mother    Cancer Mother    Diabetes Mother    Hyperlipidemia Mother    Hypertension Mother    Headache Mother    Hypertension Father    Heart attack Maternal Uncle     Social History   Socioeconomic History   Marital status: Married    Spouse name: Museum/gallery conservator   Number of children: 1   Years of education: Not on file   Highest education level: Some college, no degree  Occupational History   Occupation: [employer]  Tobacco Use   Smoking status: Never   Smokeless tobacco: Never  Vaping Use    Vaping Use: Never used  Substance and Sexual Activity   Alcohol use: No   Drug use: Yes    Types: Marijuana    Comment: 03/27/22 on Sat   Sexual activity: Yes  Other Topics Concern   Not on file  Social History Narrative   Lives with family   Caffeine- sodas 3-4 cans   Social Determinants of Health   Financial Resource Strain: High Risk (03/26/2022)   Overall Financial Resource Strain (CARDIA)    Difficulty of Paying Living Expenses: Hard  Food Insecurity: Food Insecurity Present (03/26/2022)   Hunger Vital Sign    Worried About Running Out of Food in the Last Year: Sometimes true    Ran Out of Food in the Last Year: Sometimes true  Transportation Needs: No Transportation Needs (03/26/2022)   PRAPARE - Hydrologist (Medical): No    Lack of Transportation (Non-Medical): No  Physical Activity: Not on file  Stress: Not on file  Social Connections: Not on file  Intimate Partner Violence: Not on file    Review of Systems  Constitutional:  Positive for malaise/fatigue. Negative for fever.  HENT:  Negative for hearing loss and sinus pain.   Eyes:  Negative for blurred vision and discharge.  Respiratory:  Negative for cough and shortness of breath.   Cardiovascular:  Positive for chest pain (with walking long distances.). Negative for leg swelling.  Genitourinary: Negative.   Musculoskeletal: Negative.   Neurological:  Positive for headaches. Negative for dizziness, tingling and tremors.  Psychiatric/Behavioral:  Negative for depression, substance abuse and suicidal ideas. The patient is not nervous/anxious.         Objective    BP 133/86   Pulse 84   Ht 5\' 8"  (1.727 m)   Wt 255 lb 1.6 oz (115.7 kg)   BMI 38.79 kg/m   Physical Exam Constitutional:      Appearance: Normal appearance. He is obese.  HENT:     Right Ear: Tympanic membrane normal.     Left Ear: Tympanic membrane normal.     Nose: Nose normal.     Mouth/Throat:      Mouth: Mucous membranes are moist.  Eyes:     Conjunctiva/sclera: Conjunctivae normal.     Pupils: Pupils are equal, round, and reactive to light.  Cardiovascular:     Rate and Rhythm: Normal rate and regular rhythm.  Pulmonary:     Effort: Pulmonary effort is normal. No respiratory distress.     Breath sounds: Normal breath sounds.  Abdominal:     General: Bowel sounds are normal. There is distension.     Palpations: Abdomen is soft. There is no mass.     Hernia: No hernia is present.  Musculoskeletal:     Cervical back: Normal range of  motion.  Skin:    General: Skin is warm.  Neurological:     General: No focal deficit present.     Mental Status: He is alert and oriented to person, place, and time. Mental status is at baseline.  Psychiatric:        Mood and Affect: Mood normal.        Behavior: Behavior normal.        Thought Content: Thought content normal.        Judgment: Judgment normal.        Assessment & Plan:   Problem List Items Addressed This Visit       Cardiovascular and Mediastinum   Essential hypertension    Patient BP 133/86 in the office today. Advised pt to follow a low sodium and heart healthy diet. Continue the current medication        Other   Depression    PHQ-9 score 17. Started him on Zoloft 100 mg daily. We will continue to monitor      Relevant Medications   sertraline (ZOLOFT) 100 MG tablet   Increased frequency of headaches - Primary    Started him on 325 mg acetaminophen every 6-8 hours as needed for headache. Would refer him to neurologist for further evaluation.      Relevant Medications   sertraline (ZOLOFT) 100 MG tablet   acetaminophen (TYLENOL) 325 MG tablet   Other Relevant Orders   Ambulatory referral to Neurology   Class 2 obesity with body mass index (BMI) of 38.0 to 38.9 in adult    Body mass index is 38.79 kg/m. Advised pt to lose weight. Advised patient to avoid trans fat, fatty and fried food. Follow a  regular physical activity schedule. Went over the risk of chronic diseases with increased weight.            No follow-ups on file.   Kara Dies, NP

## 2022-03-23 MED ORDER — ACETAMINOPHEN 325 MG PO TABS: 650.0000 mg | ORAL_TABLET | Freq: Four times a day (QID) | ORAL | 0 refills | Status: DC | PRN

## 2022-03-26 ENCOUNTER — Other Ambulatory Visit
Admission: RE | Admit: 2022-03-26 | Discharge: 2022-03-26 | Disposition: A | Discharge status: Home / Self Care | Payer: BC Managed Care – PPO | Source: Ambulatory Visit | Attending: Family | Admitting: Family

## 2022-03-26 ENCOUNTER — Ambulatory Visit (HOSPITAL_BASED_OUTPATIENT_CLINIC_OR_DEPARTMENT_OTHER): Payer: BC Managed Care – PPO | Admitting: Family

## 2022-03-26 ENCOUNTER — Encounter: Payer: Self-pay | Admitting: Family

## 2022-03-26 ENCOUNTER — Telehealth: Payer: Self-pay | Admitting: Licensed Clinical Social Worker

## 2022-03-26 ENCOUNTER — Telehealth: Payer: Self-pay | Admitting: Family

## 2022-03-26 VITALS — BP 146/94 | HR 82 | Resp 16 | Ht 68.0 in | Wt 260.1 lb

## 2022-03-26 DIAGNOSIS — I5032 Chronic diastolic (congestive) heart failure: Secondary | ICD-10-CM

## 2022-03-26 DIAGNOSIS — R55 Syncope and collapse: Secondary | ICD-10-CM | POA: Insufficient documentation

## 2022-03-26 DIAGNOSIS — F17210 Nicotine dependence, cigarettes, uncomplicated: Secondary | ICD-10-CM | POA: Insufficient documentation

## 2022-03-26 DIAGNOSIS — Z5986 Financial insecurity: Secondary | ICD-10-CM | POA: Insufficient documentation

## 2022-03-26 DIAGNOSIS — Z8249 Family history of ischemic heart disease and other diseases of the circulatory system: Secondary | ICD-10-CM | POA: Insufficient documentation

## 2022-03-26 DIAGNOSIS — I110 Hypertensive heart disease with heart failure: Secondary | ICD-10-CM | POA: Insufficient documentation

## 2022-03-26 DIAGNOSIS — Z79899 Other long term (current) drug therapy: Secondary | ICD-10-CM | POA: Insufficient documentation

## 2022-03-26 DIAGNOSIS — F32A Depression, unspecified: Secondary | ICD-10-CM | POA: Insufficient documentation

## 2022-03-26 DIAGNOSIS — G4733 Obstructive sleep apnea (adult) (pediatric): Secondary | ICD-10-CM | POA: Insufficient documentation

## 2022-03-26 DIAGNOSIS — I5022 Chronic systolic (congestive) heart failure: Secondary | ICD-10-CM | POA: Diagnosis present

## 2022-03-26 DIAGNOSIS — I10 Essential (primary) hypertension: Secondary | ICD-10-CM

## 2022-03-26 DIAGNOSIS — F329 Major depressive disorder, single episode, unspecified: Secondary | ICD-10-CM

## 2022-03-26 LAB — BASIC METABOLIC PANEL
Anion gap: 9 (ref 5–15)
BUN: 10 mg/dL (ref 6–20)
CO2: 27 mmol/L (ref 22–32)
Calcium: 9.3 mg/dL (ref 8.9–10.3)
Chloride: 105 mmol/L (ref 98–111)
Creatinine, Ser: 1.16 mg/dL (ref 0.61–1.24)
GFR, Estimated: >60 mL/min (ref >60)
Glucose, Bld: 102 mg/dL — ABNORMAL HIGH (ref 70–99)
Potassium: 3.2 mmol/L — ABNORMAL LOW (ref 3.5–5.1)
Sodium: 141 mmol/L (ref 135–145)

## 2022-03-26 MED ORDER — POTASSIUM CHLORIDE CRYS ER 20 MEQ PO TBCR: 40.0000 meq | EXTENDED_RELEASE_TABLET | Freq: Once | ORAL | 0 refills | Status: DC

## 2022-03-26 MED ORDER — SACUBITRIL-VALSARTAN 97-103 MG PO TABS: 1.0000 | ORAL_TABLET | Freq: Two times a day (BID) | ORAL | 3 refills | Status: DC

## 2022-03-26 NOTE — Progress Notes (Signed)
Patient ID: Benjamin Press., male    DOB: 08/15/1985, 36 y.o.   MRN: [id number]   Benjamin Goodman is a 37 y/o male with a history of HTN, sleep apnea, depression and chronic heart failure.  Echo report from 02/06/22 reviewed and showed an EF of 50-55% along with moderate LVH and mild LAE. Echo report from 09/14/19 reviewed and showed an EF of 35-40% along with moderate LVH and trivial Benjamin.   Was in the ED 03/11/22 due to syncopal event at work after bending over. Hypokalemic which was corrected. Head CT and cspine were negative and he was released.   He presents today for a follow-up visit with a chief complaint of moderate fatigue with minimal exertion. Describes this as chronic in nature. He has associated decreased appetite, cough, shortness of breath, headaches (since recent syncope), depression, difficulty sleeping and a syncopal event 2 weeks ago. He denies any abdominal distention, palpitations, pedal edema, chest pain, wheezing, dizziness or weight gain.   He says that he's had quite a headache since his recent syncopal event and he's unable to return to work until he's been cleared. Is waiting to hear from neurology about an appointment.   He reports increased stress since he's not working he's concerned that he may lose his apartment or been unable to pay his utilities. He had previously lived in a barn without any electricity when he separated from his wife and worked hard to save up enough money to live in this apartment and have his son live with him and now he's afraid he may lose everything.   He recently saw a PCP who made the neurology referral and then told him that he didn't need to come see her anymore.   Past Medical History:  Diagnosis Date   CHF (congestive heart failure) (HCC)    Hypertension    Sleep apnea    Past Surgical History:  Procedure Laterality Date   TONSILLECTOMY     Family History  Problem Relation Age of Onset   Asthma Mother    Cancer Mother     Diabetes Mother    Hyperlipidemia Mother    Hypertension Mother    Hypertension Father    Heart attack Maternal Uncle    Social History   Tobacco Use   Smoking status: Every Day    Types: Cigarettes   Smokeless tobacco: Never  Substance Use Topics   Alcohol use: No   No Known Allergies  Prior to Admission medications   Medication Sig Start Date End Date Taking? Authorizing Provider  acetaminophen (TYLENOL) 325 MG tablet Take 2 tablets (650 mg total) by mouth every 6 (six) hours as needed for mild pain or headache. 03/23/22  Yes Theresia Lo, NP  amLODipine (NORVASC) 10 MG tablet Take 1 tablet (10 mg total) by mouth daily. 03/11/22 04/10/22 Yes Rada Hay, MD  aspirin 81 MG chewable tablet Chew 81 mg by mouth daily.   Yes [provider]  cloNIDine (CATAPRES) 0.2 MG tablet Take 1 tablet (0.2 mg total) by mouth 2 (two) times daily. 03/12/22 04/11/22 Yes Rada Hay, MD  furosemide (LASIX) 40 MG tablet Take 1 tablet (40 mg total) by mouth daily. 03/12/22  Yes Rada Hay, MD  hydrALAZINE (APRESOLINE) 50 MG tablet Take 1 tablet (50 mg total) by mouth 3 (three) times daily. 03/12/22  Yes Rada Hay, MD  isosorbide mononitrate (IMDUR) 30 MG 24 hr tablet Take 1 tablet (30 mg total) by mouth  daily. 03/12/22  Yes Rada Hay, MD  spironolactone (ALDACTONE) 25 MG tablet Take 1 tablet (25 mg total) by mouth daily. 03/12/22  Yes Rada Hay, MD  potassium chloride SA (KLOR-CON M) 20 MEQ tablet Take 2 tablets (40 mEq total) by mouth once for 1 dose. 03/26/22 03/26/22  Alisa Graff, FNP  sacubitril-valsartan (ENTRESTO) 97-103 MG Take 1 tablet by mouth 2 (two) times daily. 03/26/22   Alisa Graff, FNP  sertraline (ZOLOFT) 100 MG tablet Take 1 tablet (100 mg total) by mouth daily. Future refills from PCP Patient not taking: Reported on 03/26/2022 03/22/22   Theresia Lo, NP   Review of Systems  Constitutional:  Positive for appetite change  (decreased) and fatigue (easily).  HENT:  Negative for congestion, postnasal drip and sore throat.   Eyes:  Positive for visual disturbance (blurry vision).  Respiratory:  Positive for cough and shortness of breath. Negative for wheezing.   Cardiovascular:  Negative for chest pain, palpitations and leg swelling.  Gastrointestinal:  Negative for abdominal distention and abdominal pain.  Endocrine: Negative.   Genitourinary: Negative.   Musculoskeletal:  Negative for back pain and neck pain.  Skin: Negative.   Allergic/Immunologic: Negative.   Neurological:  Positive for syncope (at work on 03/22/22) and headaches (in back of head). Negative for dizziness and light-headedness.  Hematological:  Negative for adenopathy. Does not bruise/bleed easily.  Psychiatric/Behavioral:  Positive for dysphoric mood and sleep disturbance (wearing CPAP 2-3 hours/night). Negative for agitation, self-injury and suicidal ideas. The patient is not nervous/anxious.    Vitals:   03/26/22 0906 03/26/22 0935  BP: (!) 169/127 (!) 146/94  Pulse: 82   Resp: 16   SpO2: 100%   Weight: 260 lb 2 oz (118 kg)   Height: 5\' 8"  (1.727 m)    Wt Readings from Last 3 Encounters:  03/26/22 260 lb 2 oz (118 kg)  03/22/22 255 lb 1.6 oz (115.7 kg)  03/11/22 262 lb (118.8 kg)   Lab Results  Component Value Date   CREATININE 1.01 03/11/2022   CREATININE 1.32 (H) 02/06/2022   CREATININE 1.14 12/06/2021   Physical Exam Vitals and nursing note reviewed.  Constitutional:      Appearance: Normal appearance.  HENT:     Head: Normocephalic and atraumatic.  Cardiovascular:     Rate and Rhythm: Normal rate and regular rhythm.     Pulses: Normal pulses.     Heart sounds: Normal heart sounds. No murmur heard.    No gallop.  Pulmonary:     Effort: Pulmonary effort is normal. No respiratory distress.     Breath sounds: No wheezing or rales.  Abdominal:     General: There is no distension.     Palpations: Abdomen is soft.      Tenderness: There is no abdominal tenderness.  Musculoskeletal:        General: No tenderness.     Cervical back: Normal range of motion and neck supple.     Right lower leg: No edema.     Left lower leg: No edema.  Skin:    General: Skin is warm and dry.  Neurological:     General: No focal deficit present.     Mental Status: He is alert and oriented to person, place, and time.  Psychiatric:        Mood and Affect: Mood is depressed.        Behavior: Behavior normal.        Thought Content:  Thought content normal.     Assessment & Plan:  1: Chronic heart failure with now preserved ejection fraction with structural changes- - NYHA class III - euvolemic today - not weighing daily; encouraged to resume so that he can call for an overnight weight gain of >2 pounds or a weekly weight gain of >5 pounds - weight stable since last visit here 4 months ago - not adding salt but says that he and his son do eat out often although he tries to make good choices such as grilled chicken instead of fried - saw cardiology ([facility]) 12/14/20; f/u appointment scheduled for 04/16/22 - EF has now improved - on GDMT of entresto - consider adding SGLT2 at next visit although did have recent syncopal even resulting in ED visit - BNP 04/11/21 was 18.9  2: HTN- - BP initially elevated (169/127); improved upon recheck (146/94) - saw PCP [facility]) 03/22/22 and was told he didn't need to return; has a different PCP appointment in November that he is planning on keeping - BMP 03/11/22 reviewed and showed sodium 138, potassium 3.0, creatinine 1.01 and GFR >60 - will recheck BMP today - saw nephrology Holley Raring) 11/14/20  3: Severe obstructive sleep apnea- - wearing his CPAP 2-3 hours at a time but doesn't feel like he sleeps or feels any better with wearing it - does work 4pm-4am   4: Depression- - currently not working because his employer won't let him return until he's been cleared by neurology - concerned  that he may lose his apartment without any income and he doesn't want to end back up in a barn again - will message social worker at [facility] and ask her to call him to see what assistance can be provided  5: Syncope- - neurology referral has already been placed - our office called [facility] and appointment has been scheduled for tomorrow   Medication bottles reviewed.   Return in 1 month, sooner if needed.

## 2022-03-26 NOTE — Progress Notes (Signed)
Heart and Vascular Care Navigation  03/26/2022  Benjamin Goodman Jan 26, 1986 [id number]  Reason for Referral:  Patient is participating in a Managed Medicaid Plan: No, commercial plan Benjamin Goodman with patient by telephone for initial visit for Heart and Vascular Care Coordination.                                                                                                   Assessment:   LCSW spoke with pt following [place] [facility] appt. Referred due to issues with making ends meet while he has been out of work waiting on neurology f/u. Introduced self, role, reason for call. Pt has previously spoken with my colleagues about concerns related to finances. He confirms current home address, PCP, and shares concerns about his rent and electric bill. He currently lives with his son (35 yo), and works for [employer]. He does not receive any SNAP benefits at this time, he has not spoken with DSS about any options, when he has financial concerns he usually sells his things to make up the cash.   LCSW encouraged pt to first go to DSS to speak with someone about SNAP and bring with him the bill for his electric as he needs to see if they have any funds that may be able to assist or can refer him to another organization. We discussed again the Patient [place] and what that can do for assistance. We went over again what documents we would need and how to complete those if interested.   No additional questions from pt at this time. See below for information sent to pt and additional f/u planned.            HRT/VAS Care Coordination     Patients Home Cardiology Office [facility] [facility]   Outpatient Care Team Social Worker   Social Worker Name: Valeda Malm, [place] [phone]   Living arrangements for the past 2 months Mobile Home   Lives with: Minor Children   Patient Current Insurance Sports coach   Patient Has Concern With Paying Medical Bills No   Does  Patient Have Prescription Coverage? Yes   Home Assistive Devices/Equipment None       Social History:                                                                             SDOH Screenings   Food Insecurity: Food Insecurity Present (03/26/2022)  Housing: High Risk (03/26/2022)  Transportation Needs: No Transportation Needs (03/26/2022)  Utilities: At Risk (03/26/2022)  Depression (PHQ2-9): High Risk (03/26/2022)  Financial Resource Strain: High Risk (03/26/2022)  Tobacco Use: High Risk (03/26/2022)    SDOH Interventions: Financial Resources:  Financial Strain Interventions: Other (Comment) (referral to DSS and discussed Patient [place]) DSS for financial assistance  Food Insecurity:  Food Insecurity Interventions: Other (Comment) (encouraged pt to go to DSS to enroll in SNAP benefits)  Housing Insecurity:  Housing Interventions: Other (Comment) (referral to DSS for utility assistance and Patient Care Fund discussed)  Transportation:   Transportation Interventions: Intervention Not Indicated    Other Care Navigation Interventions:     Provided Pharmacy assistance resources  Pt has commercial coverage, denies any issues, has copay card on file for [place]   Follow-up plan:   LCSW has emailed pt a blank W9 form for him to bring to his leasing agency to complete as well as a blank acknowledgement form for Patient Care Fund. Also noted he will need to provide supporting documents for housing costs and income (ex: lease and tax return). I will f/u with pt moving forward to ensure he has received documents/gotten in touch with DSS. I also encouraged pt to f/u with neurology office if he doesn't hear from them about referral. I remain available.

## 2022-03-26 NOTE — Patient Instructions (Addendum)
Begin weighing daily and call for an overnight weight gain of 3 pounds or more or a weekly weight gain of more than 5 pounds.   If you have voicemail, please make sure your mailbox is cleaned out so that we may leave a message and please make sure to listen to any voicemails.    If you receive a satisfaction survey regarding the [facility], please take the time to fill it out. This way we can continue to provide excellent care and make any changes that need to be made.    [facility]. [facility]yn [facility]) [address] [place], [place] [address] [phone] 04/16/22 3pm Dr. Saralyn Pilar

## 2022-03-26 NOTE — Telephone Encounter (Signed)
Spoke with patient regarding BMP results obtained earlier today. Kidney function looks great but potassium is still slightly low at 3.2 Will send in 69meq potassium that he will take one time.   He verbalized understanding. Will recheck labs at his next visit

## 2022-03-27 ENCOUNTER — Encounter: Payer: Self-pay | Admitting: Psychiatry

## 2022-03-27 ENCOUNTER — Telehealth: Payer: Self-pay | Admitting: *Deleted

## 2022-03-27 ENCOUNTER — Ambulatory Visit (INDEPENDENT_AMBULATORY_CARE_PROVIDER_SITE_OTHER): Payer: BC Managed Care – PPO | Admitting: Psychiatry

## 2022-03-27 VITALS — BP 162/83 | HR 91 | Ht 68.0 in | Wt 256.4 lb

## 2022-03-27 DIAGNOSIS — R200 Anesthesia of skin: Secondary | ICD-10-CM | POA: Diagnosis not present

## 2022-03-27 DIAGNOSIS — H0520 Unspecified exophthalmos: Secondary | ICD-10-CM | POA: Diagnosis not present

## 2022-03-27 DIAGNOSIS — R202 Paresthesia of skin: Secondary | ICD-10-CM | POA: Diagnosis not present

## 2022-03-27 DIAGNOSIS — F0781 Postconcussional syndrome: Secondary | ICD-10-CM | POA: Diagnosis not present

## 2022-03-27 MED ORDER — NURTEC 75 MG PO TBDP: 75.0000 mg | ORAL_TABLET | ORAL | 6 refills | Status: DC | PRN

## 2022-03-27 MED ORDER — TOPIRAMATE 25 MG PO TABS: ORAL_TABLET | ORAL | 6 refills | Status: DC

## 2022-03-27 NOTE — Patient Instructions (Addendum)
Start Topamax for headache prevention. Take 25 mg (1 pill) at bedtime for one week, then increase to 50 mg (2 pills) at bedtime for one week, then take 75 mg (3 pills) at bedtime for one week, then take 100 mg (4 pills) at bedtime  Start Nurtec as needed for migraine headaches. Take one pill at onset of headache  MRI brain  Post Concussive Syndrome:  Post-concussion syndrome is a complex disorder in which various symptoms -- such as headaches and dizziness -- last for weeks and sometimes months after the injury that caused the concussion. Concussion is a mild traumatic brain injury, usually occurring after a blow to the head. Loss of consciousness isn't required for a diagnosis of concussion or post-concussion syndrome. In fact, the risk of post-concussion syndrome doesn't appear to be associated with the severity of the initial injury. In most people, post-concussion syndrome symptoms occur within the first seven to 10 days and go away within three months, though they can persist for a year or more. Post-concussion syndrome treatments are aimed at easing specific symptoms.  Post-concussion symptoms include: Headaches  Dizziness  Fatigue  Irritability  Anxiety  Insomnia  Loss of concentration and memory  Noise and light sensitivity  Headaches that occur after a concussion can vary and may feel like tension-type headaches or migraines. Most, however, are tension-type headaches, which may be associated with a neck injury that happened at the same time as the head injury. In some cases, people experience behavior or emotional changes after a mild traumatic brain injury. Family members may notice that the person has become more irritable, suspicious, argumentative or stubborn. When to see a doctor See a doctor if you experience a head injury severe enough to cause confusion or amnesia -- even if you never lost consciousness. If a concussion occurs while you're playing a sport, don't go back in  the game. Seek medical attention so that you don't risk worsening your injury. Causes: In many cases, both physiological effects of brain trauma and emotional reactions to these effects play a role in the development of symptoms. Researchers haven't determined why some people who've had concussions develop persistent post-concussion symptoms while others do not. No proven correlation between the severity of the injury and the likelihood of developing persistent post-concussion symptoms exists.  There is no specific treatment for post-concussion syndrome. Instead, your doctor will treat the individual symptoms you're experiencing. The types of symptoms and their frequency are unique to each person. Headaches Medications commonly used for migraines or tension headaches, including some antidepressants, appear to be effective when these types of headaches are associated with post-concussion syndrome. Examples include: Amitriptyline. This medication has been widely used for post-traumatic injuries, as well as for symptoms commonly associated with post-concussion syndrome, such as irritability, dizziness and depression. Topiramate. Commonly used to treat migraines, topiramate (Qudexy XR, Topamax, Trokendi XR) may be effective in reducing headaches after head injury. Common side effects of topiramate include weight loss and cognitive problems.  Gabapentin. Gabapentin (Gralise, Neurontin) is frequently used to treat a variety of types of pain and may be helpful in treating post-traumatic headaches. A common side effect of gabapentin is drowsiness.  Other agents used to treat migraines and tension-type headaches may also be helpful in some individuals. Keep in mind that the overuse of over-the-counter and prescription pain relievers may contribute to persistent post-concussion headaches. Memory and thinking problems No medications are currently recommended specifically for the treatment of cognitive problems  after mild traumatic brain injury.  Time may be the best therapy for post-concussion syndrome if you have cognitive problems, as most of them go away on their own in the weeks to months following the injury. Certain forms of cognitive therapy may be helpful, including focused rehabilitation that provides training in how to use a pocket calendar, Education officer, community or other techniques to work around memory deficits and attention skills. Relaxation therapy also may help. Dizziness Vestibular rehab (a specialized form of physical therapy can help this. Depression and anxiety The symptoms of post-concussion syndrome often improve after the affected person learns that there is a cause for his or her symptoms and that they will likely improve with time. Education about the disorder can ease a person's fears and help provide peace of mind. If you're experiencing new or increasing depression or anxiety after a concussion, some treatment options include: Psychotherapy. It may be helpful to discuss your concerns with a psychologist or psychiatrist who has experience in working with people with brain injury.  Medication. To combat anxiety or depression, antidepressants or anti-anxiety medications may be prescribed. Prevention: The only known way to prevent post-concussion syndrome is to avoid the head injury in the first place. Avoiding head injuries Although you can't prepare for every potential situation, here are some tips for avoiding common causes of head injuries: Fasten your seat belt whenever you're traveling in a car, and be sure children are in age-appropriate safety seats. Children under 13 are safest riding in the back seat, especially if your car has air bags.  Use helmets whenever you or your children are bicycling, roller-skating, in-line skating, ice-skating, skiing, snowboarding, playing football, batting or running the bases in softball or baseball, skateboarding, or horseback riding. Wear a  helmet when riding a motorcycle.  Take steps around the house to prevent falls, such as removing small area rugs, improving lighting and installing handrails.

## 2022-03-27 NOTE — Progress Notes (Signed)
Referring:  Kara Dies, NP [address] [place],  [place] [address]  PCP: Kara Dies, NP  Neurology was asked to evaluate Benjamin Goodman., a 36 year old male for a chief complaint of headaches.  Our recommendations of care will be communicated by shared medical record.    CC:  headaches  History provided from self, wife Benjamin Goodman  HPI:  Medical co-morbidities: HTN, CHF, OSA, TMJ  The patient presents for evaluation of headaches which began after a syncopal event on 03/11/22. States his entire body felt weak, then he passed out and hit his head on the concrete floor. Was out for 4-5 minutes. He was confused afterwards. Presented to the ED where he was found to be mildly hypokalemic. Was told he likely passed out due to low blood pressure. CTH and C-spine showed no acute process. It did show interval development of mild proptosis since prior [facility] in August. Has not had thyroid checked recently.  Since the accident he has had constant headaches described as occipital pressure associated with photophobia and nausea. Takes Tylenol as needed which helps temporarily but pain always returns. Has felt more irritable and had trouble sleeping since the accident. Has felt lightheaded but not had any more syncopal episodes.  Headache History: Onset: October 2023 Triggers: bright lights Aura: blurry Location: occiput Quality/Description: pressure Associated Symptoms:  Photophobia: yes  Phonophobia: no  Nausea: yes Worse with activity?: yes Duration of headaches: constant  Headache days per month: 15 Headache free days per month: 15  Current Treatment: Abortive Tylenol  Preventative none  Prior Therapies                                 Amlodipine 10 mg daily Lisinopril 20 mg daily Metoprolol 200 mg daily Zoloft 100 mg daily   LABS: CBC    Component Value Date/Time   WBC 8.0 03/11/2022 2107   RBC 5.15 03/11/2022 2107   HGB 14.1 03/11/2022 2107   HGB 15.6  01/18/2014 1615   HCT 43.8 03/11/2022 2107   HCT 48.2 01/18/2014 1615   PLT 208 03/11/2022 2107   PLT 167 01/18/2014 1615   MCV 85.0 03/11/2022 2107   MCV 84 01/18/2014 1615   MCH 27.4 03/11/2022 2107   MCHC 32.2 03/11/2022 2107   RDW 15.0 03/11/2022 2107   RDW 14.8 (H) 01/18/2014 1615   LYMPHSABS 2.2 04/11/2021 0436   LYMPHSABS 3.6 01/18/2014 1615   MONOABS 0.9 04/11/2021 0436   MONOABS 0.6 01/18/2014 1615   EOSABS 0.4 04/11/2021 0436   EOSABS 0.1 01/18/2014 1615   BASOSABS 0.0 04/11/2021 0436   BASOSABS 0.1 01/18/2014 1615      Latest Ref Rng & Units 03/26/2022    9:50 AM 03/11/2022    9:07 PM 02/06/2022    2:57 AM  CMP  Glucose 70 - 99 mg/dL 191  478  295   BUN 6 - 20 mg/dL 10  9  14    Creatinine 0.61 - 1.24 mg/dL  6.21  3.08   Sodium 135 - 145 mmol/L 141  138  139   Potassium 3.5 - 5.1 mmol/L 3.2  3.0  2.7   Chloride 98 - 111 mmol/L 105  105  105   CO2 22 - 32 mmol/L 27  27  27    Calcium 8.9 - 10.3 mg/dL 9.3  8.5  8.1   Total Protein 6.5 - 8.1 g/dL   7.1  Total Bilirubin 0.3 - 1.2 mg/dL   0.8   Alkaline Phos 38 - 126 U/L   37   AST 15 - 41 U/L   30   ALT 0 - 44 U/L   31      IMAGING:  CTH and C-spine 03/11/22:  1. No acute intracranial abnormality. 2.  Interval development of mild proptosis. 3. No acute displaced fracture or traumatic listhesis of the cervical spine.  Imaging independently reviewed on March 27, 2022   Current Outpatient Medications on File Prior to Visit  Medication Sig Dispense Refill   acetaminophen (TYLENOL) 325 MG tablet Take 2 tablets (650 mg total) by mouth every 6 (six) hours as needed for mild pain or headache. 30 tablet 0   amLODipine (NORVASC) 10 MG tablet Take 1 tablet (10 mg total) by mouth daily. 30 tablet 0   aspirin 81 MG chewable tablet Chew 81 mg by mouth daily.     cloNIDine (CATAPRES) 0.2 MG tablet Take 1 tablet (0.2 mg total) by mouth 2 (two) times daily. 60 tablet 0   furosemide (LASIX) 40 MG tablet Take 1 tablet  (40 mg total) by mouth daily. 30 tablet 0   hydrALAZINE (APRESOLINE) 50 MG tablet Take 1 tablet (50 mg total) by mouth 3 (three) times daily. 270 tablet 3   isosorbide mononitrate (IMDUR) 30 MG 24 hr tablet Take 1 tablet (30 mg total) by mouth daily. 90 tablet 3   sacubitril-valsartan (ENTRESTO) 97-103 MG Take 1 tablet by mouth 2 (two) times daily. 180 tablet 3   sertraline (ZOLOFT) 100 MG tablet Take 1 tablet (100 mg total) by mouth daily. Future refills from PCP 30 tablet 0   spironolactone (ALDACTONE) 25 MG tablet Take 1 tablet (25 mg total) by mouth daily. 90 tablet 3   potassium chloride SA (KLOR-CON M) 20 MEQ tablet Take 2 tablets (40 mEq total) by mouth once for 1 dose. 2 tablet 0   No current facility-administered medications on file prior to visit.     Allergies: No Known Allergies  Family History: Migraine or other headaches in the family:  mother, sister, son Aneurysms in a first degree relative:  no Brain tumors in the family:  no Other neurological illness in the family:   no  Past Medical History: Past Medical History:  Diagnosis Date   CHF (congestive heart failure) (HCC)    Headache    Hypertension    Sleep apnea    occass CPAP user    Past Surgical History Past Surgical History:  Procedure Laterality Date   TONSILLECTOMY      Social History: Social History   Tobacco Use   Smoking status: Never   Smokeless tobacco: Never  Vaping Use   Vaping Use: Never used  Substance Use Topics   Alcohol use: No   Drug use: Yes    Types: Marijuana    Comment: 03/27/22 on Sat    ROS: Negative for fevers, chills. Positive for headaches, insomnia, irritability. All other systems reviewed and negative unless stated otherwise in HPI.   Physical Exam:   Vital Signs: BP (!) 162/83   Pulse 91   Ht 5\' 8"  (1.727 m)   Wt 256 lb 6.4 oz (116.3 kg)   BMI 38.99 kg/m  GENERAL: well appearing,in no acute distress,alert SKIN:  Color, texture, turgor normal. No rashes  or lesions HEAD:  Normocephalic/atraumatic. CV:  RRR RESP: Normal respiratory effort MSK: +tenderness to palpation over occiput, neck, and shoulders  NEUROLOGICAL: Mental  Status: Alert, oriented to person, place and time,Follows commands Cranial Nerves: PERRL, visual fields intact to confrontation, extraocular movements intact, decreased sensation over left V2-3, no facial droop or ptosis, hearing grossly intact, no dysarthria, palate elevate symmetrically Motor: muscle strength 5/5 both upper and lower extremities,no drift, normal tone Reflexes: 2+ throughout Sensation: Decreased sensation to light touch over LLE, otherwise intact to light touch throughout Coordination: Finger-to- nose-finger intact bilaterally Gait: normal-based   IMPRESSION: 36 year old male with a history of  HTN, CHF, OSA, TMJ who presents for evaluation of headaches, insomnia, and irritability following a head trauma. He does not report any seizure-like activity associated with his syncopal episode. His symptoms are most consistent with post-concussive syndrome. CTH showed no acute intracranial process but did show new proptosis. Will check TSH level today. Will order brain MRI as exam today reveals decreased sensation over his left face and leg. He would like to start a medication to help with his headaches. Will start Topamax for headache prevention and Nurtec for rescue. Would avoid triptans given history of poorly controlled HTN.  PLAN: -TSH -MRI brain -Preventive: Start Topamax 25 mg QHS, increase by 25 mg weekly up to 100 mg QHS -Rescue: Start Nurtec 75 mg PRN  I spent a total of 38 minutes chart reviewing and counseling the patient. Headache education was done. Discussed treatment options including preventive and acute medications. Discussed medication side effects, adverse reactions and drug interactions. Written educational materials and patient instructions outlining all of the above were given.  Follow-up:  6 months   Ocie Doyne, MD 03/27/2022   2:49 PM

## 2022-03-27 NOTE — Telephone Encounter (Signed)
Patient brought [employer] papers with him, his employer took him out of work one month. Dr Billey Gosling completed, I faxed papers to [facility]. Received confirmation. Papers sent to medical records.

## 2022-03-28 LAB — TSH: TSH: 0.625 u[IU]/mL (ref 0.450–4.500)

## 2022-03-29 ENCOUNTER — Telehealth: Payer: Self-pay | Admitting: Licensed Clinical Social Worker

## 2022-03-29 NOTE — Telephone Encounter (Signed)
H&V Care Navigation CSW Progress Note  Clinical Social Worker contacted patient by phone to f/u on email sent and patient care fund assistance. Was able to reach pt at [phone]. Pt confirmed he received paperwork. He is working on gathering documents needed - we discussed alternate methods to getting income statements such as a note on letter head from his employer. Will f/u with pt next week to see if any additional questions.   Patient is participating in a Managed Medicaid Plan:  No, BCBS commercial plan only.   SDOH Screenings   Food Insecurity: Food Insecurity Present (03/26/2022)  Housing: High Risk (03/26/2022)  Transportation Needs: No Transportation Needs (03/26/2022)  Utilities: At Risk (03/26/2022)  Depression (PHQ2-9): High Risk (03/26/2022)  Financial Resource Strain: High Risk (03/26/2022)  Tobacco Use: Low Risk  (03/27/2022)  Recent Concern: Tobacco Use - High Risk (03/26/2022)    Westley Hummer, MSW, [place]  [phone]- work cell phone (preferred) [phone]- desk phone

## 2022-04-01 ENCOUNTER — Telehealth: Payer: Self-pay | Admitting: Psychiatry

## 2022-04-01 NOTE — Telephone Encounter (Signed)
Called pt to schedule MR brain w/wo contrast, no answer and no VM set up. Sent MyChart msg.  BCBS auth: NPR

## 2022-04-02 ENCOUNTER — Ambulatory Visit: Payer: BC Managed Care – PPO | Admitting: Family

## 2022-04-08 ENCOUNTER — Encounter (INDEPENDENT_AMBULATORY_CARE_PROVIDER_SITE_OTHER): Payer: Self-pay

## 2022-04-08 ENCOUNTER — Telehealth: Payer: Self-pay | Admitting: Licensed Clinical Social Worker

## 2022-04-08 NOTE — Telephone Encounter (Signed)
H&V Care Navigation CSW Progress Note  Clinical Social Worker contacted patient by phone to f/u on email sent and patient care fund assistance. Was unable to reach pt today at [phone]- voicemail is not set up at this time. Will f/u with pt 1x more to see if still interested in completing this assistance request.    Patient is participating in a Managed Medicaid Plan:  No, BCBS commercial plan only.   SDOH Screenings   Food Insecurity: Food Insecurity Present (03/26/2022)  Housing: High Risk (03/26/2022)  Transportation Needs: No Transportation Needs (03/26/2022)  Utilities: At Risk (03/26/2022)  Depression (PHQ2-9): High Risk (03/26/2022)  Financial Resource Strain: High Risk (03/26/2022)  Tobacco Use: Low Risk  (03/27/2022)  Recent Concern: Tobacco Use - High Risk (03/26/2022)    Westley Hummer, MSW, [place]  [phone]- work cell phone (preferred) [phone]- desk phone

## 2022-04-09 DIAGNOSIS — F32A Depression, unspecified: Secondary | ICD-10-CM | POA: Insufficient documentation

## 2022-04-09 DIAGNOSIS — R519 Headache, unspecified: Secondary | ICD-10-CM | POA: Insufficient documentation

## 2022-04-09 DIAGNOSIS — E669 Obesity, unspecified: Secondary | ICD-10-CM | POA: Insufficient documentation

## 2022-04-09 DIAGNOSIS — Z6838 Body mass index (BMI) 38.0-38.9, adult: Secondary | ICD-10-CM | POA: Insufficient documentation

## 2022-04-09 NOTE — Assessment & Plan Note (Signed)
Patient BP 133/86 in the office today. Advised pt to follow a low sodium and heart healthy diet. Continue the current medication

## 2022-04-09 NOTE — Assessment & Plan Note (Signed)
Body mass index is 38.79 kg/m. Advised pt to lose weight. Advised patient to avoid trans fat, fatty and fried food. Follow a regular physical activity schedule. Went over the risk of chronic diseases with increased weight.

## 2022-04-09 NOTE — Assessment & Plan Note (Addendum)
Started him on 325 mg acetaminophen every 6-8 hours as needed for headache. Would refer him to neurologist for further evaluation.

## 2022-04-09 NOTE — Assessment & Plan Note (Signed)
PHQ-9 score 17. Started him on Zoloft 100 mg daily. We will continue to monitor

## 2022-04-11 ENCOUNTER — Telehealth: Payer: Self-pay | Admitting: Psychiatry

## 2022-04-11 ENCOUNTER — Telehealth: Payer: Self-pay | Admitting: Licensed Clinical Social Worker

## 2022-04-11 NOTE — Telephone Encounter (Signed)
Sent mychart msg informing pt of appointment change from 10/23/22 to 09/18/22 - MD out 

## 2022-04-11 NOTE — Telephone Encounter (Signed)
H&V Care Navigation CSW Progress Note  Clinical Social Worker contacted patient by phone to f/u on email sent and patient care fund assistance again. Was unable to reach pt today at [phone]- voicemail is not set up at this time. I remain available as needed. Pt had been sent my contact information should he be interested further in patient care fund assistance.   Patient is participating in a Managed Medicaid Plan:  No, BCBS commercial plan only.   SDOH Screenings   Food Insecurity: Food Insecurity Present (03/26/2022)  Housing: High Risk (03/26/2022)  Transportation Needs: No Transportation Needs (03/26/2022)  Utilities: At Risk (03/26/2022)  Depression (PHQ2-9): High Risk (03/26/2022)  Financial Resource Strain: High Risk (03/26/2022)  Tobacco Use: Low Risk  (03/27/2022)  Recent Concern: Tobacco Use - High Risk (03/26/2022)   Westley Hummer, MSW, [place]  [phone]- work cell phone (preferred) [phone]- desk phone

## 2022-04-15 ENCOUNTER — Telehealth: Payer: Self-pay | Admitting: Licensed Clinical Social Worker

## 2022-04-15 NOTE — Telephone Encounter (Signed)
H&V Care Navigation CSW Progress Note   Clinical Social Worker contacted patient by phone to f/u one last time regarding patient care fund assistance. Continue to be unable to reach pt today at [phone]- voicemail is still not set up at this time. I remain available as needed. Pt had been sent my contact information should he be interested further in patient care fund assistance.    Patient is participating in a Managed Medicaid Plan:  No, BCBS commercial plan only.   SDOH Screenings   Food Insecurity: Food Insecurity Present (03/26/2022)  Housing: High Risk (03/26/2022)  Transportation Needs: No Transportation Needs (03/26/2022)  Utilities: At Risk (03/26/2022)  Depression (PHQ2-9): High Risk (03/26/2022)  Financial Resource Strain: High Risk (03/26/2022)  Tobacco Use: Low Risk  (03/27/2022)  Recent Concern: Tobacco Use - High Risk (03/26/2022)    Westley Hummer, MSW, [place]  [phone]- work cell phone (preferred) [phone]- desk phone

## 2022-04-17 ENCOUNTER — Other Ambulatory Visit: Payer: Self-pay | Admitting: Nurse Practitioner

## 2022-04-22 ENCOUNTER — Ambulatory Visit: Payer: Self-pay | Admitting: Family Medicine

## 2022-04-25 ENCOUNTER — Telehealth: Payer: Self-pay | Admitting: Family

## 2022-04-25 ENCOUNTER — Ambulatory Visit: Payer: BC Managed Care – PPO | Admitting: Family

## 2022-04-25 NOTE — Progress Notes (Deleted)
Patient ID: Benjamin Goodman., male    DOB: June 17, 1985, 36 y.o.   MRN: [id number]   [name] is a 36 y/o male with a history of HTN, sleep apnea, depression and chronic heart failure.  Echo report from 02/06/22 reviewed and showed an EF of 50-55% along with moderate LVH and mild LAE. Echo report from 09/14/19 reviewed and showed an EF of 35-40% along with moderate LVH and trivial MR.   Was in the ED 03/11/22 due to syncopal event at work after bending over. Hypokalemic which was corrected. Head CT and cspine were negative and he was released.   He presents today for a follow-up visit with a chief complaint of   Past Medical History:  Diagnosis Date   CHF (congestive heart failure) (HCC)    Headache    Hypertension    Sleep apnea    occass CPAP user   Past Surgical History:  Procedure Laterality Date   TONSILLECTOMY     Family History  Problem Relation Age of Onset   Asthma Mother    Cancer Mother    Diabetes Mother    Hyperlipidemia Mother    Hypertension Mother    Headache Mother    Hypertension Father    Heart attack Maternal Uncle    Social History   Tobacco Use   Smoking status: Never   Smokeless tobacco: Never  Substance Use Topics   Alcohol use: No   No Known Allergies   Review of Systems  Constitutional:  Positive for appetite change (decreased) and fatigue (easily).  HENT:  Negative for congestion, postnasal drip and sore throat.   Eyes:  Positive for visual disturbance (blurry vision).  Respiratory:  Positive for cough and shortness of breath. Negative for wheezing.   Cardiovascular:  Negative for chest pain, palpitations and leg swelling.  Gastrointestinal:  Negative for abdominal distention and abdominal pain.  Endocrine: Negative.   Genitourinary: Negative.   Musculoskeletal:  Negative for back pain and neck pain.  Skin: Negative.   Allergic/Immunologic: Negative.   Neurological:  Positive for syncope (at work on 03/22/22) and headaches (in back  of head). Negative for dizziness and light-headedness.  Hematological:  Negative for adenopathy. Does not bruise/bleed easily.  Psychiatric/Behavioral:  Positive for dysphoric mood and sleep disturbance (wearing CPAP 2-3 hours/night). Negative for agitation, self-injury and suicidal ideas. The patient is not nervous/anxious.      Physical Exam Vitals and nursing note reviewed.  Constitutional:      Appearance: Normal appearance.  HENT:     Head: Normocephalic and atraumatic.  Cardiovascular:     Rate and Rhythm: Normal rate and regular rhythm.     Pulses: Normal pulses.     Heart sounds: Normal heart sounds. No murmur heard.    No gallop.  Pulmonary:     Effort: Pulmonary effort is normal. No respiratory distress.     Breath sounds: No wheezing or rales.  Abdominal:     General: There is no distension.     Palpations: Abdomen is soft.     Tenderness: There is no abdominal tenderness.  Musculoskeletal:        General: No tenderness.     Cervical back: Normal range of motion and neck supple.     Right lower leg: No edema.     Left lower leg: No edema.  Skin:    General: Skin is warm and dry.  Neurological:     General: No focal deficit present.  Mental Status: He is alert and oriented to person, place, and time.  Psychiatric:        Mood and Affect: Mood is depressed.        Behavior: Behavior normal.        Thought Content: Thought content normal.     Assessment & Plan:  1: Chronic heart failure with now preserved ejection fraction with structural changes- - NYHA class III - euvolemic today - not weighing daily; encouraged to resume so that he can call for an overnight weight gain of >2 pounds or a weekly weight gain of >5 pounds - weight 260.2 since last visit here 1 month ago - not adding salt but says that he and his son do eat out often although he tries to make good choices such as grilled chicken instead of fried - saw cardiology ([place]) 12/14/20; f/u  appointment scheduled for 04/16/22 - EF has now improved - on GDMT of entresto - consider adding SGLT2 at next visit although did have recent syncopal even resulting in ED visit - BNP 04/11/21 was 18.9  2: HTN- - BP  - saw PCP [facility]) 03/22/22 and was told he didn't need to return; has a different PCP appointment in November that he is planning on keeping - BMP 03/26/22 reviewed and showed sodium 141, potassium 3.2, creatinine 1.16 and GFR >60 - saw nephrology [facility]) 11/14/20  3: Severe obstructive sleep apnea- - wearing his CPAP 2-3 hours at a time but doesn't feel like he sleeps or feels any better with wearing it - does work 4pm-4am   4: Depression- - currently not working because his employer won't let him return until he's been cleared by neurology - concerned that he may lose his apartment without any income and 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

## 2022-04-25 NOTE — Telephone Encounter (Signed)
Patient did not show for his [facility] appointment on 04/25/22. Will attempt to reschedule.

## 2022-05-01 ENCOUNTER — Ambulatory Visit: Payer: Self-pay | Admitting: Family Medicine

## 2022-08-21 ENCOUNTER — Emergency Department: Payer: BC Managed Care – PPO

## 2022-08-21 ENCOUNTER — Other Ambulatory Visit: Payer: Self-pay

## 2022-08-21 ENCOUNTER — Emergency Department
Admission: EM | Admit: 2022-08-21 | Discharge: 2022-08-21 | Disposition: A | Discharge status: Home / Self Care | Payer: BC Managed Care – PPO | Attending: Emergency Medicine | Admitting: Emergency Medicine

## 2022-08-21 DIAGNOSIS — I110 Hypertensive heart disease with heart failure: Secondary | ICD-10-CM | POA: Diagnosis not present

## 2022-08-21 DIAGNOSIS — R0789 Other chest pain: Secondary | ICD-10-CM | POA: Insufficient documentation

## 2022-08-21 DIAGNOSIS — I5042 Chronic combined systolic (congestive) and diastolic (congestive) heart failure: Secondary | ICD-10-CM | POA: Insufficient documentation

## 2022-08-21 DIAGNOSIS — M542 Cervicalgia: Secondary | ICD-10-CM | POA: Diagnosis not present

## 2022-08-21 DIAGNOSIS — R519 Headache, unspecified: Secondary | ICD-10-CM | POA: Diagnosis not present

## 2022-08-21 DIAGNOSIS — R1032 Left lower quadrant pain: Secondary | ICD-10-CM | POA: Insufficient documentation

## 2022-08-21 DIAGNOSIS — I10 Essential (primary) hypertension: Secondary | ICD-10-CM

## 2022-08-21 DIAGNOSIS — M7918 Myalgia, other site: Secondary | ICD-10-CM

## 2022-08-21 DIAGNOSIS — Y92410 Unspecified street and highway as the place of occurrence of the external cause: Secondary | ICD-10-CM | POA: Diagnosis not present

## 2022-08-21 LAB — URINALYSIS, ROUTINE W REFLEX MICROSCOPIC
Bacteria, UA: NONE SEEN
Bilirubin Urine: NEGATIVE
Glucose, UA: NEGATIVE mg/dL
Hgb urine dipstick: NEGATIVE
Ketones, ur: NEGATIVE mg/dL
Leukocytes,Ua: NEGATIVE
Nitrite: NEGATIVE
Protein, ur: 30 mg/dL — AB
Specific Gravity, Urine: 1.01 (ref 1.005–1.030)
Squamous Epithelial / HPF: NONE SEEN /HPF (ref 0–5)
pH: 7 (ref 5.0–8.0)

## 2022-08-21 LAB — TROPONIN I (HIGH SENSITIVITY)
Troponin I (High Sensitivity): 19 ng/L — ABNORMAL HIGH (ref <18)
Troponin I (High Sensitivity): 21 ng/L — ABNORMAL HIGH (ref <18)

## 2022-08-21 LAB — CBC WITH DIFFERENTIAL/PLATELET
Abs Immature Granulocytes: 0.02 10*3/uL (ref 0.00–0.07)
Basophils Absolute: 0 10*3/uL (ref 0.0–0.1)
Basophils Relative: 0 %
Eosinophils Absolute: 0.1 10*3/uL (ref 0.0–0.5)
Eosinophils Relative: 1 %
HCT: 48.8 % (ref 39.0–52.0)
Hemoglobin: 16.1 g/dL (ref 13.0–17.0)
Immature Granulocytes: 0 %
Lymphocytes Relative: 28 %
Lymphs Abs: 2.9 10*3/uL (ref 0.7–4.0)
MCH: 27.2 pg (ref 26.0–34.0)
MCHC: 33 g/dL (ref 30.0–36.0)
MCV: 82.3 fL (ref 80.0–100.0)
Monocytes Absolute: 0.9 10*3/uL (ref 0.1–1.0)
Monocytes Relative: 9 %
Neutro Abs: 6.3 10*3/uL (ref 1.7–7.7)
Neutrophils Relative %: 62 %
Platelets: 214 10*3/uL (ref 150–400)
RBC: 5.93 MIL/uL — ABNORMAL HIGH (ref 4.22–5.81)
RDW: 14.1 % (ref 11.5–15.5)
WBC: 10.2 10*3/uL (ref 4.0–10.5)
nRBC: 0 % (ref 0.0–0.2)

## 2022-08-21 LAB — BASIC METABOLIC PANEL
Anion gap: 8 (ref 5–15)
BUN: 12 mg/dL (ref 6–20)
CO2: 25 mmol/L (ref 22–32)
Calcium: 8.9 mg/dL (ref 8.9–10.3)
Chloride: 106 mmol/L (ref 98–111)
Creatinine, Ser: 1.05 mg/dL (ref 0.61–1.24)
GFR, Estimated: >60 mL/min (ref >60)
Glucose, Bld: 110 mg/dL — ABNORMAL HIGH (ref 70–99)
Potassium: 3 mmol/L — ABNORMAL LOW (ref 3.5–5.1)
Sodium: 139 mmol/L (ref 135–145)

## 2022-08-21 MED ORDER — MORPHINE SULFATE (PF) 4 MG/ML IV SOLN
4.0000 mg | Freq: Once | INTRAVENOUS | Status: AC
  Administered 2022-08-21: 4 mg via INTRAVENOUS
  Filled 2022-08-21 (×2): qty 1

## 2022-08-21 MED ORDER — CLONIDINE HCL 0.1 MG PO TABS
0.2000 mg | ORAL_TABLET | Freq: Once | ORAL | Status: AC
  Administered 2022-08-21: 0.2 mg via ORAL
  Filled 2022-08-21: qty 2

## 2022-08-21 MED ORDER — HYDRALAZINE HCL 50 MG PO TABS
50.0000 mg | ORAL_TABLET | Freq: Once | ORAL | Status: AC
  Administered 2022-08-21: 50 mg via ORAL
  Filled 2022-08-21: qty 1

## 2022-08-21 MED ORDER — KETOROLAC TROMETHAMINE 15 MG/ML IJ SOLN
15.0000 mg | Freq: Once | INTRAMUSCULAR | Status: AC
  Administered 2022-08-21: 15 mg via INTRAVENOUS
  Filled 2022-08-21: qty 1

## 2022-08-21 MED ORDER — LIDOCAINE 5 % EX PTCH
1.0000 | MEDICATED_PATCH | CUTANEOUS | Status: DC
  Administered 2022-08-21: 1 via TRANSDERMAL
  Filled 2022-08-21: qty 1

## 2022-08-21 MED ORDER — IOHEXOL 300 MG/ML  SOLN
100.0000 mL | Freq: Once | INTRAMUSCULAR | Status: AC | PRN
  Administered 2022-08-21: 100 mL via INTRAVENOUS

## 2022-08-21 NOTE — Discharge Instructions (Addendum)
Your CT head, neck, chest, abdomen, pelvis did not reveal any acute traumatic injury.  Your blood pressure was quite elevated, you were given your dose of clonidine and hydralazine.  You may take Tylenol/ibuprofen per package instructions to help with any aches and pains you may have.  However, if you develop chest pain, shortness of breath, nausea, sweating, vomiting, please return to the emergency department immediately.  Please also return for any new, worsening, or change in symptoms or any other concerns.  Continue to take your blood pressure medications as prescribed.  It was a pleasure caring for you today.
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
## 2022-08-21 NOTE — ED Notes (Signed)
Pt states that he hasn't had any of his bp meds today, states that he took last nights dose after work
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
## 2022-08-29 ENCOUNTER — Ambulatory Visit (INDEPENDENT_AMBULATORY_CARE_PROVIDER_SITE_OTHER): Payer: BC Managed Care – PPO

## 2022-08-29 ENCOUNTER — Encounter: Payer: Self-pay | Admitting: Family

## 2022-08-29 ENCOUNTER — Ambulatory Visit
Admission: EM | Admit: 2022-08-29 | Discharge: 2022-08-29 | Disposition: A | Discharge status: Home / Self Care | Payer: BC Managed Care – PPO | Attending: Emergency Medicine | Admitting: Emergency Medicine

## 2022-08-29 ENCOUNTER — Other Ambulatory Visit: Payer: Self-pay

## 2022-08-29 DIAGNOSIS — S060X0A Concussion without loss of consciousness, initial encounter: Secondary | ICD-10-CM | POA: Diagnosis not present

## 2022-08-29 DIAGNOSIS — R0781 Pleurodynia: Secondary | ICD-10-CM | POA: Diagnosis not present

## 2022-08-29 DIAGNOSIS — M25511 Pain in right shoulder: Secondary | ICD-10-CM

## 2022-08-29 DIAGNOSIS — I10 Essential (primary) hypertension: Secondary | ICD-10-CM

## 2022-08-29 MED ORDER — CYCLOBENZAPRINE HCL 10 MG PO TABS: 10.0000 mg | ORAL_TABLET | Freq: Every day | ORAL | 0 refills | Status: DC

## 2022-08-29 MED ORDER — PREDNISONE 20 MG PO TABS: 40.0000 mg | ORAL_TABLET | Freq: Every day | ORAL | 0 refills | Status: DC
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
## 2022-08-29 NOTE — Discharge Instructions (Addendum)
For your headache -As your headache has persisted for 7 days since head injury, I believe you have a mild concussion, CT imaging was completed day of incident and was negative, as symptoms have not worsened we will not repeat at this time -Attempt to complete low stimulation activities to allow for brain rest, avoid bright lights and loud noises -Please follow-up with our concussion clinic, information is listed on front page, may schedule appointment for evaluation -Prednisone that you have been prescribed will help with all pain throughout the body, may take Tylenol in addition to this -Keep upcoming appointment with neurology for further evaluation and management as well -Any point if you begin to have a more severe or intense headache, vision worsens, you pass out or begin to have persistent vomiting please go to the nearest emergency department for immediate evaluation  For your right shoulder -Able to elicit pain on exam -X-ray shows additional curvature to the subacromial space which is where pain is on exam -It has been recommended supportive care and if symptoms continue to persist that you will need an MRI which can be completed by orthopedics whose information is all for page  Rib pain -On exam able to elicit pain directly over the rib cage, no pain is felt over the abdomen, low suspicion for organ involvement -This pain is muscular and should improve with time  For all body pain -Start prednisone every morning with food for 5 days to reduce the inflammatory process which in turn will help with your discomfort, may take Tylenol 500 to 1000 mg every 6 hours in addition to this -May use muscle relaxant at bedtime for additional comfort -May use ice or heat over the affected areas -May massage and stretch as tolerated  For your blood pressure -It is elevated here in the office, take all daily medication as directed without skipping doses to bring level back down -EKG showed no  changes in comparison to prior EKGs completed
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
## 2022-09-09 ENCOUNTER — Telehealth: Payer: Self-pay | Admitting: Family

## 2022-09-09 ENCOUNTER — Encounter: Payer: BC Managed Care – PPO | Admitting: Family

## 2022-09-09 NOTE — Telephone Encounter (Signed)
Patient did not show for his Heart Failure Clinic appointment on 09/09/22.
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
## 2022-09-18 ENCOUNTER — Ambulatory Visit: Payer: BC Managed Care – PPO | Admitting: Psychiatry

## 2022-09-18 ENCOUNTER — Encounter: Payer: Self-pay | Admitting: Psychiatry

## 2022-09-18 VITALS — BP 200/137 | HR 91 | Ht 68.0 in | Wt 246.4 lb

## 2022-09-18 DIAGNOSIS — F0781 Postconcussional syndrome: Secondary | ICD-10-CM | POA: Diagnosis not present

## 2022-09-18 DIAGNOSIS — G43009 Migraine without aura, not intractable, without status migrainosus: Secondary | ICD-10-CM | POA: Diagnosis not present

## 2022-09-18 DIAGNOSIS — M542 Cervicalgia: Secondary | ICD-10-CM | POA: Diagnosis not present

## 2022-09-18 MED ORDER — TOPIRAMATE 25 MG PO TABS: ORAL_TABLET | ORAL | 6 refills | Status: DC

## 2022-09-18 MED ORDER — NURTEC 75 MG PO TBDP: 75.0000 mg | ORAL_TABLET | ORAL | 6 refills | Status: DC | PRN

## 2022-09-18 NOTE — Patient Instructions (Addendum)
Increase Topamax to 3 pills at bedtime for one week, then increase to 4 pills at bedtime and stay on that dose  Take Nurtec as needed for severe headaches  Referral to physical therapy placed for neck pain and tension  Please follow up with your family doctor for your blood pressure

## 2022-09-30 ENCOUNTER — Ambulatory Visit: Payer: BC Managed Care – PPO | Attending: Psychiatry

## 2022-09-30 DIAGNOSIS — M5412 Radiculopathy, cervical region: Secondary | ICD-10-CM | POA: Diagnosis present

## 2022-09-30 DIAGNOSIS — M542 Cervicalgia: Secondary | ICD-10-CM

## 2022-09-30 DIAGNOSIS — M25511 Pain in right shoulder: Secondary | ICD-10-CM | POA: Diagnosis present
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
## 2022-10-02 ENCOUNTER — Ambulatory Visit: Payer: BC Managed Care – PPO

## 2022-10-02 DIAGNOSIS — M542 Cervicalgia: Secondary | ICD-10-CM | POA: Diagnosis not present

## 2022-10-02 DIAGNOSIS — M5412 Radiculopathy, cervical region: Secondary | ICD-10-CM

## 2022-10-02 DIAGNOSIS — M25511 Pain in right shoulder: Secondary | ICD-10-CM
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
## 2022-10-08 ENCOUNTER — Ambulatory Visit: Payer: BC Managed Care – PPO

## 2022-10-08 DIAGNOSIS — M25511 Pain in right shoulder: Secondary | ICD-10-CM

## 2022-10-08 DIAGNOSIS — M542 Cervicalgia: Secondary | ICD-10-CM

## 2022-10-08 DIAGNOSIS — M5412 Radiculopathy, cervical region: Secondary | ICD-10-CM
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
## 2022-10-10 ENCOUNTER — Ambulatory Visit: Payer: BC Managed Care – PPO | Attending: Psychiatry

## 2022-10-10 DIAGNOSIS — M542 Cervicalgia: Secondary | ICD-10-CM

## 2022-10-10 DIAGNOSIS — M25511 Pain in right shoulder: Secondary | ICD-10-CM

## 2022-10-10 DIAGNOSIS — M5412 Radiculopathy, cervical region: Secondary | ICD-10-CM | POA: Diagnosis present
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
## 2022-10-14 ENCOUNTER — Ambulatory Visit: Payer: BC Managed Care – PPO

## 2022-10-14 ENCOUNTER — Other Ambulatory Visit: Payer: Self-pay

## 2022-10-14 ENCOUNTER — Emergency Department
Admission: EM | Admit: 2022-10-14 | Discharge: 2022-10-14 | Disposition: A | Discharge status: Home / Self Care | Payer: BC Managed Care – PPO | Attending: Emergency Medicine | Admitting: Emergency Medicine

## 2022-10-14 DIAGNOSIS — R55 Syncope and collapse: Secondary | ICD-10-CM | POA: Diagnosis not present

## 2022-10-14 DIAGNOSIS — I110 Hypertensive heart disease with heart failure: Secondary | ICD-10-CM | POA: Insufficient documentation

## 2022-10-14 DIAGNOSIS — I5042 Chronic combined systolic (congestive) and diastolic (congestive) heart failure: Secondary | ICD-10-CM | POA: Diagnosis not present

## 2022-10-14 DIAGNOSIS — E876 Hypokalemia: Secondary | ICD-10-CM | POA: Diagnosis not present

## 2022-10-14 DIAGNOSIS — R42 Dizziness and giddiness: Secondary | ICD-10-CM | POA: Diagnosis present

## 2022-10-14 DIAGNOSIS — M542 Cervicalgia: Secondary | ICD-10-CM

## 2022-10-14 DIAGNOSIS — M25511 Pain in right shoulder: Secondary | ICD-10-CM

## 2022-10-14 DIAGNOSIS — M5412 Radiculopathy, cervical region: Secondary | ICD-10-CM

## 2022-10-14 LAB — CBC
HCT: 45.2 % (ref 39.0–52.0)
Hemoglobin: 14.9 g/dL (ref 13.0–17.0)
MCH: 27.2 pg (ref 26.0–34.0)
MCHC: 33 g/dL (ref 30.0–36.0)
MCV: 82.5 fL (ref 80.0–100.0)
Platelets: 215 10*3/uL (ref 150–400)
RBC: 5.48 MIL/uL (ref 4.22–5.81)
RDW: 14.8 % (ref 11.5–15.5)
WBC: 7.3 10*3/uL (ref 4.0–10.5)
nRBC: 0 % (ref 0.0–0.2)

## 2022-10-14 LAB — TROPONIN I (HIGH SENSITIVITY)
Troponin I (High Sensitivity): 19 ng/L — ABNORMAL HIGH (ref <18)
Troponin I (High Sensitivity): 15 ng/L (ref <18)

## 2022-10-14 LAB — BASIC METABOLIC PANEL
Anion gap: 9 (ref 5–15)
BUN: 17 mg/dL (ref 6–20)
CO2: 23 mmol/L (ref 22–32)
Calcium: 8.5 mg/dL — ABNORMAL LOW (ref 8.9–10.3)
Chloride: 107 mmol/L (ref 98–111)
Creatinine, Ser: 1.17 mg/dL (ref 0.61–1.24)
GFR, Estimated: >60 mL/min (ref >60)
Glucose, Bld: 164 mg/dL — ABNORMAL HIGH (ref 70–99)
Potassium: 3 mmol/L — ABNORMAL LOW (ref 3.5–5.1)
Sodium: 139 mmol/L (ref 135–145)

## 2022-10-14 MED ORDER — ACETAMINOPHEN 500 MG PO TABS
1000.0000 mg | ORAL_TABLET | Freq: Once | ORAL | Status: AC
  Administered 2022-10-14: 1000 mg via ORAL
  Filled 2022-10-14: qty 2

## 2022-10-14 MED ORDER — POTASSIUM CHLORIDE CRYS ER 20 MEQ PO TBCR
40.0000 meq | EXTENDED_RELEASE_TABLET | Freq: Once | ORAL | Status: AC
  Administered 2022-10-14: 40 meq via ORAL
  Filled 2022-10-14: qty 2

## 2022-10-14 MED ORDER — LACTATED RINGERS IV BOLUS
1000.0000 mL | Freq: Once | INTRAVENOUS | Status: AC
  Administered 2022-10-14: 1000 mL via INTRAVENOUS

## 2022-10-14 NOTE — ED Triage Notes (Addendum)
Arrives via [facility]>  From physical therapy.  Called for syncopal episode.  Initial BP after event was 98. Hx chf, htn.  Being seen in PT for a shoulder injury.  VS wnl per ems.  CBG:  140.  Patient c/o headache.  AAOx3.  Diaphoretic.

## 2022-10-14 NOTE — Discharge Instructions (Signed)
Your blood work was reassuring.  Please make sure you are staying hydrated and that you get up slowly.  If you feel lightheaded again sit down and put your head between your knees.  If you develop any symptoms that are concerning to you such as chest pain shortness of breath or worsening headache then please return to the emergency department.  Otherwise please follow-up with your cardiologist and primary care doctor.

## 2022-10-14 NOTE — ED Provider Notes (Signed)
[facility] Provider Note    Event Date/Time   First MD Initiated Contact with Patient 10/14/22 1022     (approximate)   History   Loss of Consciousness   HPI  Benjamin Goodman. is a 37 y.o. male with past medical history of hypertension, CHF, OSA who presents because of syncopal episode.  Patient felt fine this morning.  Went to physical therapy and was doing an exercise where he pulls down a rope and felt pain in between his shoulder blades and into his chest.  He then felt very lightheaded and sat down.  He apparently then syncopized.  He said that they checked his blood pressure at physical therapy and it was low.  Upon awakening he still felt very lightheaded and dizzy.  Also complains of diffuse headache which started after the syncopal episode.  He denies fevers chills cough dyspnea.  No chest pain currently.  Denies vomiting or diarrhea.     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   Cardiomyopathy of undetermined type (HCC) 10/14/2019   Chest pain 09/13/2019   Essential hypertension 09/13/2019   Obesity, Class III, BMI 40-49.9 (morbid obesity) (HCC) 09/13/2019   Elevated troponin 09/13/2019   Hypertensive emergency 09/13/2019   Hypertensive urgency 09/13/2019   Acute respiratory failure with hypoxia (HCC) 01/23/2017     Physical Exam  Triage Vital Signs: ED Triage Vitals  Enc Vitals Group     BP 10/14/22 1009 (!) 92/55     Pulse Rate 10/14/22 1009 87      Resp 10/14/22 1009 16     Temp 10/14/22 1015 98.1 F (36.7 C)     Temp Source 10/14/22 1009 Oral     SpO2 10/14/22 1009 100 %     Weight 10/14/22 1009 246 lb 7.6 oz (111.8 kg)     Height 10/14/22 1009 5\' 8"  (1.727 m)     Head Circumference --      Peak Flow --      Pain Score 10/14/22 1009 0     Pain Loc --      Pain Edu? --      Excl. in GC? --     Most recent vital signs: Vitals:   10/14/22 1026 10/14/22 1409  BP: 135/61 136/74  Pulse: 82 74  Resp: 18 17  Temp:  98.4 F (36.9 C)  SpO2: 98% 99%     General: Awake, no distress.  CV:  Good peripheral perfusion.  no peripheral edema Resp:  Normal effort.  Abd:  No distention. Soft nontender throughout Neuro:             Awake, Alert, Oriented x 3  Other:     ED Results / Procedures / Treatments  Labs (all labs ordered are listed, but only abnormal results are displayed) Labs Reviewed  BASIC METABOLIC PANEL - Abnormal; Notable for the following components:  Result Value   Potassium 3.0 (*)    Glucose, Bld 164 (*)    Calcium 8.5 (*)    All other components within normal limits  TROPONIN I (HIGH SENSITIVITY) - Abnormal; Notable for the following components:   Troponin I (High Sensitivity) 19 (*)    All other components within normal limits  CBC  URINALYSIS, ROUTINE W REFLEX MICROSCOPIC  CBG MONITORING, ED  TROPONIN I (HIGH SENSITIVITY)  TROPONIN I (HIGH SENSITIVITY)     EKG  EKG shows sinus rhythm with a left axis deviation and ST depression T wave inversion lateral precordial leads similar to prior   RADIOLOGY    PROCEDURES:  Critical Care performed: No  Procedures  The patient is on the cardiac monitor to evaluate for evidence of arrhythmia and/or significant heart rate changes.   MEDICATIONS ORDERED IN ED: Medications  lactated ringers bolus 1,000 mL (0 mLs Intravenous Stopped 10/14/22 1213)  acetaminophen (TYLENOL) tablet 1,000 mg (1,000 mg Oral Given 10/14/22 1218)  potassium chloride SA  (KLOR-CON M) CR tablet 40 mEq (40 mEq Oral Given 10/14/22 1218)     IMPRESSION / MDM / ASSESSMENT AND PLAN / ED COURSE  I reviewed the triage vital signs and the nursing notes.                              Patient's presentation is most consistent with acute complicated illness / injury requiring diagnostic workup.  Differential diagnosis includes, but is not limited to, vasovagal syncope, orthostatic hypotension, hypovolemia, cardiac arrhythmia, less likely ACS dissection PE  The patient is a 37 year old male who presents after syncopal episode.  He was in physical therapy was doing exercise when he suddenly felt pain in between his shoulder blades into his chest and then felt very lightheaded.  Sat down and apparently had a syncopal episode and was hypotensive.  Per triage nurse when patient stood up he became diaphoretic and looked unwell.  Initial BP was 92/55.  Even prior to fluids being administered after sitting down blood pressure did improve to 130s over 70s.  Patient denies any chest pain or back pain currently this was only when he was doing the exercise where he pulls down a rope at physical therapy.  His only complaint is lightheadedness and mild headache. Patient's EKG does have lateral T wave inversions but this appears similar to prior.  Labs are notable for mild hypokalemia with potassium of 3 which was supplemented orally.  CBC is reassuring no anemia or leukocytosis.  I did check troponins x 2 which are negative.  On reassessment after bolus of fluid patient says he is feeling improved he is sitting up and walking and is not symptomatic.  No longer having headache no ongoing chest pain.  Suspect vasovagal episode possibly in the setting of pain related to his exercise at PT.  Given he is feeling back to baseline with improved blood pressure and minimal symptoms and reassuring workup I think he is appropriate for discharge.  Recommend he follow-up with primary care and cardiology.   Discussed return precautions.        FINAL CLINICAL IMPRESSION(S) / ED DIAGNOSES   Final diagnoses:  Syncope, unspecified syncope type     Rx / DC Orders   ED Discharge Orders     None        Note:  This document was prepared using Dragon voice recognition software and may include unintentional dictation errors.  Georga Hacking, MD 10/14/22 [phone]
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
## 2022-10-16 ENCOUNTER — Ambulatory Visit: Payer: BC Managed Care – PPO

## 2022-10-21 ENCOUNTER — Ambulatory Visit: Payer: BC Managed Care – PPO

## 2022-10-23 ENCOUNTER — Ambulatory Visit: Payer: BC Managed Care – PPO

## 2022-10-23 ENCOUNTER — Ambulatory Visit: Payer: BC Managed Care – PPO | Admitting: Psychiatry

## 2022-11-30 ENCOUNTER — Ambulatory Visit
Admission: EM | Admit: 2022-11-30 | Discharge: 2022-11-30 | Disposition: A | Discharge status: Home / Self Care | Payer: BC Managed Care – PPO | Attending: Family Medicine | Admitting: Family Medicine

## 2022-11-30 DIAGNOSIS — M545 Low back pain, unspecified: Secondary | ICD-10-CM

## 2022-11-30 MED ORDER — TIZANIDINE HCL 4 MG PO TABS: 4.0000 mg | ORAL_TABLET | Freq: Three times a day (TID) | ORAL | 0 refills | Status: DC | PRN

## 2022-11-30 NOTE — ED Triage Notes (Signed)
Pt states that he thinks he twisted his back yesterday. No meds for pain. 7/10

## 2022-11-30 NOTE — Discharge Instructions (Signed)
Rest. Heat.  Medication as directed.  Your BP is uncontrolled. I am not sure why you are no longer on a beta blocker, but I would recommend this in the setting of CHF unless there are contraindications.  Consider seeing [facility].  Contact your current cardiologist for Urgent Follow up.

## 2022-11-30 NOTE — ED Provider Notes (Signed)
[facility]    CSN: [id number] Arrival date & time: 11/30/22  4098      History   Chief Complaint Chief Complaint  Patient presents with   Back Pain    HPI 37 year old male with uncontrolled hypertension and congestive heart failure presents for evaluation of low back pain.  Low back pain started yesterday.  He believes that he lifted something and injured his low back.  Left-sided low back pain and spasm.  Has taken ibuprofen without relief.  Additionally, patient's blood pressure is uncontrolled.  He had a recent syncopal episode and has not followed up with cardiology.  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 Authorizing Provider  aspirin 81 MG chewable tablet Chew 81 mg by mouth daily.   Yes [provider]  furosemide (LASIX) 40 MG tablet Take 1 tablet (40 mg total) by mouth daily. 03/12/22  Yes Georga Hacking, MD  hydrALAZINE (APRESOLINE) 50 MG tablet  Take 1 tablet (50 mg total) by mouth 3 (three) times daily. 03/12/22  Yes Georga Hacking, MD  sacubitril-valsartan (ENTRESTO) 97-103 MG Take 1 tablet by mouth 2 (two) times daily. 03/26/22  Yes Hackney, Tina A, FNP  sertraline (ZOLOFT) 100 MG tablet TAKE 1 TABLET (100 MG TOTAL) BY MOUTH DAILY. FUTURE REFILLS FROM PCP 04/18/22  Yes Kara Dies, NP  spironolactone (ALDACTONE) 25 MG tablet Take 1 tablet (25 mg total) by mouth daily. 03/12/22  Yes Georga Hacking, MD  tiZANidine (ZANAFLEX) 4 MG tablet Take 1 tablet (4 mg total) by mouth every 8 (eight) hours as needed for muscle spasms. 11/30/22  Yes Kishaun Erekson G, DO  topiramate (TOPAMAX) 25 MG tablet Take 3 pills at bedtime for one week, then increase to 4 pills at bedtime and stay on this dose 09/18/22  Yes Chima, Victorino Dike, MD  cloNIDine (CATAPRES) 0.2 MG tablet Take 1 tablet (0.2 mg total) by mouth 2 (two) times daily. 03/12/22 04/11/22  Georga Hacking, MD  potassium chloride SA (KLOR-CON M) 20 MEQ tablet Take 2 tablets (40 mEq total) by mouth once for 1 dose. 03/26/22 03/26/22  Delma Freeze, FNP    Family History Family History  Problem Relation Age of Onset   Asthma Mother    Cancer Mother    Diabetes Mother    Hyperlipidemia Mother    Hypertension Mother    Headache Mother  Hypertension Father    Heart attack Maternal Uncle     Social History Social History   Tobacco Use   Smoking status: Never   Smokeless tobacco: Never  Vaping Use   Vaping Use: Never used  Substance Use Topics   Alcohol use: No   Drug use: Yes    Types: Marijuana    Comment: 03/27/22 on Sat     Allergies   Patient has no known allergies.   Review of Systems Review of Systems  Musculoskeletal:  Positive for back pain.   Physical Exam Triage Vital Signs ED Triage Vitals  Enc Vitals Group     BP 11/30/22 1016 (!) 184/119     Pulse Rate 11/30/22 1016 85     Resp 11/30/22 1016 18     Temp 11/30/22 1016 98 F (36.7 C)     Temp  Source 11/30/22 1016 Oral     SpO2 11/30/22 1016 95 %     Weight 11/30/22 1013 250 lb (113.4 kg)     Height --      Head Circumference --      Peak Flow --      Pain Score 11/30/22 1013 7     Pain Loc --      Pain Edu? --      Excl. in GC? --     Updated Vital Signs BP (!) 184/119 (BP Location: Right Arm)   Pulse 85   Temp 98 F (36.7 C) (Oral)   Resp 18   Wt 113.4 kg   SpO2 95%   BMI 38.01 kg/m   Visual Acuity Right Eye Distance:   Left Eye Distance:   Bilateral Distance:    Right Eye Near:   Left Eye Near:    Bilateral Near:     Physical Exam Vitals and nursing note reviewed.  Constitutional:      Appearance: Normal appearance.  HENT:     Head: Normocephalic and atraumatic.  Eyes:     General:        Right eye: No discharge.        Left eye: No discharge.     Conjunctiva/sclera: Conjunctivae normal.  Cardiovascular:     Rate and Rhythm: Normal rate and regular rhythm.  Pulmonary:     Effort: Pulmonary effort is normal.     Breath sounds: Normal breath sounds.  Musculoskeletal:     Comments: Left lumbar spine with tenderness/spasm.  Neurological:     Mental Status: He is alert.  Psychiatric:        Mood and Affect: Mood normal.        Behavior: Behavior normal.    UC Treatments / Results  Labs (all labs ordered are listed, but only abnormal results are displayed) Labs Reviewed - No data to display  EKG   Radiology No results found.  Procedures Procedures (including critical care time)  Medications Ordered in UC Medications - No data to display  Initial Impression / Assessment and Plan / UC Course  I have reviewed the triage vital signs and the nursing notes.  Pertinent labs & imaging results that were available during my care of the patient were reviewed by me and considered in my medical decision making (see chart for details).    37 year old male presents with low back pain with Zanaflex.  Advised rest and heat.  Patient's blood  pressure markedly uncontrolled.  I have advised him that he needs close follow-up with cardiology.  I am  not sure why he is no longer on metoprolol but this is goal-directed medical therapy regarding congestive heart failure.  Final Clinical Impressions(s) / UC Diagnoses   Final diagnoses:  Acute left-sided low back pain without sciatica     Discharge Instructions      Rest. Heat.  Medication as directed.  Your BP is uncontrolled. I am not sure why you are no longer on a beta blocker, but I would recommend this in the setting of CHF unless there are contraindications.  Consider seeing [facility].  Contact your current cardiologist for Urgent Follow up.   ED Prescriptions     Medication Sig Dispense Auth. Provider   tiZANidine (ZANAFLEX) 4 MG tablet Take 1 tablet (4 mg total) by mouth every 8 (eight) hours as needed for muscle spasms. 30 tablet Tommie Sams, DO      PDMP not reviewed this encounter.   Tommie Sams, [place] 11/30/22 1134

## 2022-12-14 ENCOUNTER — Ambulatory Visit
Admission: EM | Admit: 2022-12-14 | Discharge: 2022-12-14 | Disposition: A | Discharge status: Home / Self Care | Payer: BC Managed Care – PPO | Attending: Internal Medicine | Admitting: Internal Medicine

## 2022-12-14 DIAGNOSIS — M5442 Lumbago with sciatica, left side: Secondary | ICD-10-CM | POA: Diagnosis not present

## 2022-12-14 MED ORDER — PREDNISONE 10 MG PO TABS: ORAL_TABLET | ORAL | 0 refills | Status: DC

## 2022-12-14 MED ORDER — CYCLOBENZAPRINE HCL 10 MG PO TABS: 10.0000 mg | ORAL_TABLET | Freq: Two times a day (BID) | ORAL | 0 refills | Status: DC | PRN

## 2022-12-14 NOTE — ED Triage Notes (Signed)
Pt is with his wife  Pt c/o pain moving from left buttock to left ankle x62month  Pt states that the pain started as he was leaving work, he was doing heavy lifting up to 70lbs and had back pain.  Pt reports no new exercises or movements.

## 2022-12-14 NOTE — ED Provider Notes (Signed)
[facility]    CSN: [id number] Arrival date & time: 12/14/22  1431      History   Chief Complaint Chief Complaint  Patient presents with   Ankle Pain    HPI Benjamin Goodman. is a 37 y.o. male presents to urgent care today with complaint of left-sided low back pain.  He reports this started 1 month ago.  He describes the pain as sharp and stabbing.  The pain radiates into his left ankle.  He denies numbness, tingling but no has noticed some weakness of his left lower extremity.  He denies loss of bowel or bladder control.  He denies any specific injury to the area but about 1 month ago was doing some heavy lifting at work.  He has never had back surgery.  He was seen at urgent care 6/22 for the same and treated with muscle relaxers.  He reports the muscle relaxer's helped some but the pain still persist.  HPI  Past Medical History:  Diagnosis Date   CHF (congestive heart failure) (HCC)    Headache    Hypertension    Sleep apnea    occass CPAP user    Patient Active Problem List   Diagnosis Date Noted   Depression 04/09/2022   Class 2 obesity with body mass index (BMI) of 38.0 to 38.9 in adult 04/09/2022   Sleep apnea    Hypokalemia    Chronic combined systolic and diastolic CHF (congestive heart failure) (HCC)    Cardiomyopathy of undetermined type (HCC) 10/14/2019   Essential hypertension 09/13/2019   Hypertensive urgency 09/13/2019    Past Surgical History:  Procedure Laterality Date   TONSILLECTOMY         Home Medications    Prior to Admission medications   Medication Sig Start Date End Date Taking? Authorizing Provider  aspirin 81 MG chewable tablet Chew 81 mg by mouth daily.   Yes [provider]  cyclobenzaprine (FLEXERIL) 10 MG tablet Take 1 tablet (10 mg total) by mouth 2 (two) times daily as needed for muscle spasms. 12/14/22  Yes Lorre Munroe, NP  furosemide (LASIX) 40 MG tablet Take 1 tablet (40 mg total) by mouth daily.  03/12/22  Yes Georga Hacking, MD  hydrALAZINE (APRESOLINE) 50 MG tablet Take 1 tablet (50 mg total) by mouth 3 (three) times daily. 03/12/22  Yes Georga Hacking, MD  predniSONE (DELTASONE) 10 MG tablet Take 3 tabs on days 1-3, 2 tabs on days 4-6, 1 tab on days 7-9 12/14/22  Yes Benford Asch, Salvadore Oxford, NP  sacubitril-valsartan (ENTRESTO) 97-103 MG Take 1 tablet by mouth 2 (two) times daily. 03/26/22  Yes Hackney, Tina A, FNP  sertraline (ZOLOFT) 100 MG tablet TAKE 1 TABLET (100 MG TOTAL) BY MOUTH DAILY. FUTURE REFILLS FROM PCP 04/18/22  Yes Kara Dies, NP  spironolactone (ALDACTONE) 25 MG tablet Take 1 tablet (25 mg total) by mouth daily. 03/12/22  Yes Georga Hacking, MD  topiramate (TOPAMAX) 25 MG tablet Take 3 pills at bedtime for one week, then increase to 4 pills at bedtime and stay on this dose 09/18/22  Yes Chima, Victorino Dike, MD  cloNIDine (CATAPRES) 0.2 MG tablet Take 1 tablet (0.2 mg total) by mouth 2 (two) times daily. 03/12/22 04/11/22  Georga Hacking, MD  potassium chloride SA (KLOR-CON M) 20 MEQ tablet Take 2 tablets (40 mEq total) by mouth once for 1 dose. 03/26/22 03/26/22  Delma Freeze, FNP    Family History Family  History  Problem Relation Age of Onset   Asthma Mother    Cancer Mother    Diabetes Mother    Hyperlipidemia Mother    Hypertension Mother    Headache Mother    Hypertension Father    Heart attack Maternal Uncle     Social History Social History   Tobacco Use   Smoking status: Never   Smokeless tobacco: Never  Vaping Use   Vaping Use: Never used  Substance Use Topics   Alcohol use: No   Drug use: Yes    Types: Marijuana    Comment: 03/27/22 on Sat     Allergies   Patient has no known allergies.   Review of Systems Review of Systems  Gastrointestinal:        Negative for loss of bowel control  Genitourinary:        Negative for loss of bladder control  Musculoskeletal:  Positive for back pain and gait problem.  Neurological:  Positive  for weakness. Negative for numbness.     Physical Exam Triage Vital Signs ED Triage Vitals  Enc Vitals Group     BP 12/14/22 1507 (!) 168/145     Pulse Rate 12/14/22 1507 (!) 103     Resp --      Temp 12/14/22 1507 98.3 F (36.8 C)     Temp Source 12/14/22 1507 Oral     SpO2 12/14/22 1507 96 %     Weight 12/14/22 1505 250 lb (113.4 kg)     Height 12/14/22 1505 5\' 8"  (1.727 m)     Head Circumference --      Peak Flow --      Pain Score 12/14/22 1505 8     Pain Loc --      Pain Edu? --      Excl. in GC? --    No data found.  Updated Vital Signs BP (!) 168/145 (BP Location: Left Arm)   Pulse (!) 103   Temp 98.3 F (36.8 C) (Oral)   Ht 5\' 8"  (1.727 m)   Wt 250 lb (113.4 kg)   SpO2 96%   BMI 38.01 kg/m   Visual Acuity Right Eye Distance:   Left Eye Distance:   Bilateral Distance:    Right Eye Near:   Left Eye Near:    Bilateral Near:     Physical Exam Constitutional:      Appearance: He is obese.     Comments: Appears in pain  Cardiovascular:     Rate and Rhythm: Regular rhythm. Tachycardia present.     Heart sounds: Normal heart sounds.  Pulmonary:     Effort: Pulmonary effort is normal.     Breath sounds: Normal breath sounds.  Musculoskeletal:     Left lower leg: No edema.     Comments: Decreased flexion and extension of the spine.  Normal rotation and lateral bending.  No pain with palpation over the lumbar spine but pain with palpation over the left SI joint.  Strength 4/5 LLE, 5/5 RLE.  Skin:    General: Skin is warm and dry.     Findings: No rash.  Neurological:     Mental Status: He is alert and oriented to person, place, and time.     Motor: Weakness present.     Gait: Gait abnormal.     Comments: Positive SLR on the left at 30 degrees      UC Treatments / Results  Labs   EKG  Radiology   Medications Ordered in UC Medications - No data to display  Initial Impression / Assessment and Plan / UC Course  I have reviewed the triage  vital signs and the nursing notes.  Pertinent labs & imaging results that were available during my care of the patient were reviewed by me and considered in my medical decision making (see chart for details).     37 year old male with 1 month history of left sided low back pain extending into his left leg.  Exam consistent with sciatica.  Discussed that sciatica issues are typically not evident on x-ray although if his symptoms persist he may want to consider having this done.  Rx for Pred taper x 9 days.  Rx for Flexeril 10 mg every 8 hours as needed-sedation caution given.  Encouraged heat instead of ice.  Stretching exercises given.  Advised him to follow-up with a PCP, walk-in orthopedic clinic or chiropractor for further evaluation if symptoms persist.  Final Clinical Impressions(s) / UC Diagnoses   Final diagnoses:  Acute left-sided low back pain with left-sided sciatica     Discharge Instructions      You were seen today for persistent left-sided low back pain with left-sided sciatica.  We encouraged heat instead of ice.  I have given you stretching exercises to do.  I am putting you on steroids daily for the next 9 days as well as muscle relaxers.  The muscle relaxers may cause sedation.  You may want to consider going to [facility] walk-in clinic or scheduling appointment if your symptoms persist or worsen.     ED Prescriptions     Medication Sig Dispense Auth. Provider   predniSONE (DELTASONE) 10 MG tablet Take 3 tabs on days 1-3, 2 tabs on days 4-6, 1 tab on days 7-9 18 tablet Antonela Freiman, Salvadore Oxford, NP   cyclobenzaprine (FLEXERIL) 10 MG tablet Take 1 tablet (10 mg total) by mouth 2 (two) times daily as needed for muscle spasms. 15 tablet Lorre Munroe, NP      PDMP not reviewed this encounter.   Lorre Munroe, NP 12/14/22 [phone]

## 2022-12-14 NOTE — Discharge Instructions (Signed)
You were seen today for persistent left-sided low back pain with left-sided sciatica.  We encouraged heat instead of ice.  I have given you stretching exercises to do.  I am putting you on steroids daily for the next 9 days as well as muscle relaxers.  The muscle relaxers may cause sedation.  You may want to consider going to [facility] walk-in clinic or scheduling appointment if your symptoms persist or worsen.

## 2022-12-21 ENCOUNTER — Emergency Department
Admission: EM | Admit: 2022-12-21 | Discharge: 2022-12-21 | Disposition: A | Discharge status: Home / Self Care | Payer: BC Managed Care – PPO | Attending: Emergency Medicine | Admitting: Emergency Medicine

## 2022-12-21 ENCOUNTER — Emergency Department: Payer: BC Managed Care – PPO

## 2022-12-21 ENCOUNTER — Other Ambulatory Visit: Payer: Self-pay

## 2022-12-21 DIAGNOSIS — M5442 Lumbago with sciatica, left side: Secondary | ICD-10-CM | POA: Insufficient documentation

## 2022-12-21 DIAGNOSIS — M5136 Other intervertebral disc degeneration, lumbar region: Secondary | ICD-10-CM | POA: Diagnosis not present

## 2022-12-21 DIAGNOSIS — M5432 Sciatica, left side: Secondary | ICD-10-CM

## 2022-12-21 DIAGNOSIS — M545 Low back pain, unspecified: Secondary | ICD-10-CM | POA: Diagnosis present

## 2022-12-21 MED ORDER — KETOROLAC TROMETHAMINE 10 MG PO TABS: 10.0000 mg | ORAL_TABLET | Freq: Four times a day (QID) | ORAL | 0 refills | Status: DC | PRN

## 2022-12-21 MED ORDER — PREDNISONE 10 MG PO TABS: 10.0000 mg | ORAL_TABLET | ORAL | 0 refills | Status: DC

## 2022-12-21 MED ORDER — OXYCODONE-ACETAMINOPHEN 5-325 MG PO TABS
1.0000 | ORAL_TABLET | Freq: Once | ORAL | Status: AC
  Administered 2022-12-21: 1 via ORAL
  Filled 2022-12-21: qty 1

## 2022-12-21 MED ORDER — DEXAMETHASONE SODIUM PHOSPHATE 10 MG/ML IJ SOLN
10.0000 mg | Freq: Once | INTRAMUSCULAR | Status: AC
  Administered 2022-12-21: 10 mg via INTRAMUSCULAR
  Filled 2022-12-21: qty 1

## 2022-12-21 MED ORDER — OXYCODONE-ACETAMINOPHEN 5-325 MG PO TABS: 1.0000 | ORAL_TABLET | Freq: Four times a day (QID) | ORAL | 0 refills | Status: DC | PRN

## 2022-12-21 MED ORDER — DIAZEPAM 2 MG PO TABS
2.0000 mg | ORAL_TABLET | Freq: Once | ORAL | Status: AC
  Administered 2022-12-21: 2 mg via ORAL
  Filled 2022-12-21: qty 1

## 2022-12-21 MED ORDER — DIAZEPAM 2 MG PO TABS: 2.0000 mg | ORAL_TABLET | Freq: Three times a day (TID) | ORAL | 0 refills | Status: DC | PRN

## 2022-12-21 MED ORDER — KETOROLAC TROMETHAMINE 30 MG/ML IJ SOLN
30.0000 mg | Freq: Once | INTRAMUSCULAR | Status: AC
  Administered 2022-12-21: 30 mg via INTRAMUSCULAR
  Filled 2022-12-21: qty 1

## 2022-12-21 NOTE — ED Notes (Signed)
Assessed pt vitals. PT bp is high ask the pt if he has high bp and pt stated yes but has not been on his bp medicine in a week bc he was advised not to take it with another medication he is taking.

## 2022-12-21 NOTE — ED Triage Notes (Addendum)
Pt to ED for L leg pain sharp, shooting from lower back tdown to ankle. Pain started about 2 weks ago after heavy lifting. Has been seen at UC twice, states prescriptions not working (steroid, muscle relaxer). Hx HTN, BP in triage is 217/153. Takes Entresto, hydralazine, clonidine, lasix, aldactone. Did not take these today. Denies HA or vision changes. Pt did take 4 ibuprofen and flexeril PTA.

## 2022-12-21 NOTE — ED Provider Notes (Signed)
[facility] Provider Note  Patient Contact: 6:57 PM (approximate)   History   Leg Pain   HPI  Benjamin Goodman. is a 37 y.o. male who presents the emergency department complaining of low back pain radiating down the left leg.  This started rather suddenly roughly 2 weeks ago.  Patient felt a pop in his back, has had pain rating down his left leg.  No bowel or bladder dysfunction, saddle anesthesia or paresthesias.  Patient states that he has been seen twice in urgent care with meds with no relief of symptoms.  He feels it in the left buttocks rating down his leg with.  No direct trauma to the spine.  No history of previous back issues.     Physical Exam   Triage Vital Signs: ED Triage Vitals  Encounter Vitals Group     BP 12/21/22 1617 (!) 217/153     Systolic BP Percentile --      Diastolic BP Percentile --      Pulse Rate 12/21/22 1617 (!) 111     Resp 12/21/22 1617 20     Temp 12/21/22 1617 98.1 F (36.7 C)     Temp Source 12/21/22 1617 Oral     SpO2 12/21/22 1617 99 %     Weight 12/21/22 1615 251 lb 5.2 oz (114 kg)     Height 12/21/22 1615 5\' 8"  (1.727 m)     Head Circumference --      Peak Flow --      Pain Score 12/21/22 1614 7     Pain Loc --      Pain Education --      Exclude from Growth Chart --     Most recent vital signs: Vitals:   12/21/22 1930 12/21/22 2149  BP: (!) 184/126 (!) 199/142  Pulse: 84 95  Resp: 19 16  Temp:  98.3 F (36.8 C)  SpO2: 98% 96%     General: Alert and in no acute distress.  Cardiovascular:  Good peripheral perfusion Respiratory: Normal respiratory effort without tachypnea or retractions. Lungs CTAB.  Musculoskeletal: Full range of motion to all extremities.  Visualization of the lumbar spine reveals no visible signs of trauma.  Patient has tenderness diffusely through the lumbar spine and left paraspinal muscle region.  No palpable abnormality or step-off.  Tenderness over the sciatic notch.   Pulses, sensation intact and equal bilateral lower extremities. Neurologic:  No gross focal neurologic deficits are appreciated.  Skin:   No rash noted Other:   ED Results / Procedures / Treatments   Labs (all labs ordered are listed, but only abnormal results are displayed) Labs Reviewed - No data to display   EKG     RADIOLOGY  I personally viewed, evaluated, and interpreted these images as part of my medical decision making, as well as reviewing the written report by the radiologist.  ED Provider Interpretation: Multilevel degenerative changes identified without central cord compression.  CT Lumbar Spine Wo Contrast  Result Date: 12/21/2022 CLINICAL DATA:  Low back pain, left leg pain EXAM: CT LUMBAR SPINE WITHOUT CONTRAST TECHNIQUE: Multidetector CT imaging of the lumbar spine was performed without intravenous contrast administration. Multiplanar CT image reconstructions were also generated. RADIATION DOSE REDUCTION: This exam was performed according to the departmental dose-optimization program which includes automated exposure control, adjustment of the mA and/or kV according to patient size and/or use of iterative reconstruction technique. COMPARISON:  None Available. FINDINGS: Segmentation: There are 5  non-rib-bearing vertebrae in the lumbar region. Alignment: There is a mild retrolisthesis at L5-S1 level. There is 5 mm offset in alignment. Vertebrae: No recent fracture is seen. Paraspinal and other soft tissues: There is no central spinal stenosis. Disc levels: Bulging of the annulus is causing mild encroachment of left neural foramina at T11-T12 level. Bulging of the annulus is noted with mild narrowing of right neural foramen at L3-L4 level. Bulging annulus is causing mild encroachment of both neural foramina at L4-L5 level, more so on the left side. There is moderate encroachment of neural foramina at L5-S1 level, more so on the left side. IMPRESSION: No recent fracture is seen.  There is no central spinal stenosis. There is encroachment of neural foramina at multiple levels in lumbar spine and lower thoracic spine as described in the body of the report. Electronically Signed   By: Ernie Avena M.D.   On: 12/21/2022 19:44    PROCEDURES:  Critical Care performed: No  Procedures   MEDICATIONS ORDERED IN ED: Medications  oxyCODONE-acetaminophen (PERCOCET/ROXICET) 5-325 MG per tablet 1 tablet (1 tablet Oral Given 12/21/22 1932)  oxyCODONE-acetaminophen (PERCOCET/ROXICET) 5-325 MG per tablet 1 tablet (1 tablet Oral Given 12/21/22 2155)  diazepam (VALIUM) tablet 2 mg (2 mg Oral Given 12/21/22 2155)  ketorolac (TORADOL) 30 MG/ML injection 30 mg (30 mg Intramuscular Given 12/21/22 2155)  dexamethasone (DECADRON) injection 10 mg (10 mg Intramuscular Given 12/21/22 2155)     IMPRESSION / MDM / ASSESSMENT AND PLAN / ED COURSE  I reviewed the triage vital signs and the nursing notes.                                 Differential diagnosis includes, but is not limited to, sciatica, lumbar radiculopathy, herniated disc, bulging disc, degenerative changes, cauda equina   Patient's presentation is most consistent with acute presentation with potential threat to life or bodily function.   Patient's diagnosis is consistent with sciatica with degenerative disc disease.  Patient presents emergency department with low back pain radiating down the left leg.  Patient is seen urgent care twice and each time medicines offer a little bit of relief and then symptoms returned when medicines ran out.  No concerning neurodeficits.  Imaging revealed degenerative changes without acute central cord compression requiring emergent intervention.  As such patient will be treated with some controlled medications of anti-inflammatory, steroid, muscle relaxer and limited pain medication.  Follow-up with neurosurgery as needed.. P Patient is given ED precautions to return to the ED for any worsening  or new symptoms.     FINAL CLINICAL IMPRESSION(S) / ED DIAGNOSES   Final diagnoses:  Sciatica of left side  Degenerative disc disease, lumbar     Rx / DC Orders   ED Discharge Orders          Ordered    oxyCODONE-acetaminophen (PERCOCET/ROXICET) 5-325 MG tablet  Every 6 hours PRN        12/21/22 2155    diazepam (VALIUM) 2 MG tablet  Every 8 hours PRN        12/21/22 2155    ketorolac (TORADOL) 10 MG tablet  Every 6 hours PRN        12/21/22 2155    predniSONE (DELTASONE) 10 MG tablet  As directed       Note to Pharmacy: Take on a pattern of 6, 6, 5, 5, 4, 4, 3, 3, 2, 2, 1, 1  12/21/22 2155             Note:  This document was prepared using Dragon voice recognition software and may include unintentional dictation errors.   Lanette Hampshire 12/21/22 2157    Dionne Bucy, MD 12/22/22 1110
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
## 2023-01-27 ENCOUNTER — Encounter: Payer: Self-pay | Admitting: Orthopedic Surgery

## 2023-01-27 ENCOUNTER — Ambulatory Visit: Payer: BC Managed Care – PPO | Admitting: Orthopedic Surgery

## 2023-01-27 VITALS — BP 180/100 | Ht 68.0 in | Wt 253.0 lb

## 2023-01-27 DIAGNOSIS — M48061 Spinal stenosis, lumbar region without neurogenic claudication: Secondary | ICD-10-CM

## 2023-01-27 DIAGNOSIS — M47816 Spondylosis without myelopathy or radiculopathy, lumbar region: Secondary | ICD-10-CM

## 2023-01-27 DIAGNOSIS — M4726 Other spondylosis with radiculopathy, lumbar region: Secondary | ICD-10-CM

## 2023-01-27 DIAGNOSIS — M5416 Radiculopathy, lumbar region: Secondary | ICD-10-CM

## 2023-01-27 MED ORDER — GABAPENTIN 300 MG PO CAPS: ORAL_CAPSULE | ORAL | 0 refills | Status: AC

## 2023-01-27 MED ORDER — METHOCARBAMOL 500 MG PO TABS: 500.0000 mg | ORAL_TABLET | Freq: Three times a day (TID) | ORAL | 0 refills | Status: AC | PRN

## 2023-01-27 NOTE — Patient Instructions (Addendum)
It was so nice to see you today. Thank you so much for coming in.    The CT showed some wear and tear in your back. I think your back and left leg pain is coming from L4-L5 and L5-S1.   I want to get an MRI of your lower back to look into things further. We will get this approved through your insurance and [facility] will call you to schedule the appointment.    Outpatient Imaging (building with the white pillars) is located off of [address]. The address is [address] [place], [place] [address].   After you have the MRI, it takes 5-7 days for me to get the results back. Once I have them, I will send you a MyChart message.   I sent a prescription for methocarbamol to help with muscle spasms. Use only as needed and be careful, this can make you sleepy.   I also sent gabapentin to help with nerve pain. Take as directed. Side effects are dose related and you are on a small dose. It can make you feel sleepy, dizzy, or drunk. Call me if any issues.   I gave you a work note. Let me know if you need any paperwork filled out.   Your blood pressure was elevated today. I want you to get a new machine and recheck it at home. Follow up with your PCP if it remains high. If you have any chest pain, shortness of breath, blurry vision, or headaches then you need to go to ED. If blood pressure is over 200/100 then I would also go to ED. I will send a message to your PCP as well.   Please do not hesitate to call if you have any questions or concerns. You can also message me in MyChart.   If you have not heard back about any of the MRI in the next week, please call the office so we can help you get it scheduled.   Happy bel[birth year] birthday!  Drake Leach PA-C [phone]

## 2023-02-14 ENCOUNTER — Telehealth: Payer: Self-pay | Admitting: Psychiatry

## 2023-02-14 NOTE — Telephone Encounter (Signed)
[facility] Numbers: [id number] exp. 02/14/23-04/14/23

## 2023-02-17 ENCOUNTER — Ambulatory Visit
Admission: RE | Admit: 2023-02-17 | Discharge: 2023-02-17 | Disposition: A | Discharge status: Home / Self Care | Payer: BC Managed Care – PPO | Source: Ambulatory Visit | Attending: Psychiatry | Admitting: Psychiatry

## 2023-02-17 ENCOUNTER — Ambulatory Visit
Admission: RE | Admit: 2023-02-17 | Discharge: 2023-02-17 | Disposition: A | Discharge status: Home / Self Care | Payer: BC Managed Care – PPO | Source: Ambulatory Visit | Attending: Orthopedic Surgery | Admitting: Orthopedic Surgery

## 2023-02-17 DIAGNOSIS — M48061 Spinal stenosis, lumbar region without neurogenic claudication: Secondary | ICD-10-CM | POA: Diagnosis present

## 2023-02-17 DIAGNOSIS — M5416 Radiculopathy, lumbar region: Secondary | ICD-10-CM | POA: Insufficient documentation

## 2023-02-17 DIAGNOSIS — R202 Paresthesia of skin: Secondary | ICD-10-CM | POA: Insufficient documentation

## 2023-02-17 DIAGNOSIS — R200 Anesthesia of skin: Secondary | ICD-10-CM | POA: Insufficient documentation

## 2023-02-17 DIAGNOSIS — M47816 Spondylosis without myelopathy or radiculopathy, lumbar region: Secondary | ICD-10-CM

## 2023-02-17 MED ORDER — GADOBUTROL 1 MMOL/ML IV SOLN
10.0000 mL | Freq: Once | INTRAVENOUS | Status: AC | PRN
  Administered 2023-02-17: 10 mL via INTRAVENOUS

## 2023-02-18 ENCOUNTER — Telehealth: Payer: Self-pay | Admitting: Psychiatry

## 2023-02-18 ENCOUNTER — Other Ambulatory Visit: Payer: Self-pay | Admitting: Psychiatry

## 2023-02-18 DIAGNOSIS — D352 Benign neoplasm of pituitary gland: Secondary | ICD-10-CM

## 2023-02-18 NOTE — Telephone Encounter (Signed)
[facility] ([facility]) calling with MRI results. Report impression #1 report will now be EPIC. If have any question can call back  at the phone number provided.

## 2023-02-19 ENCOUNTER — Encounter: Payer: Self-pay | Admitting: Orthopedic Surgery

## 2023-02-19 ENCOUNTER — Other Ambulatory Visit (INDEPENDENT_AMBULATORY_CARE_PROVIDER_SITE_OTHER): Payer: Self-pay

## 2023-02-19 DIAGNOSIS — D352 Benign neoplasm of pituitary gland: Secondary | ICD-10-CM

## 2023-02-19 DIAGNOSIS — M5416 Radiculopathy, lumbar region: Secondary | ICD-10-CM

## 2023-02-19 DIAGNOSIS — Z0289 Encounter for other administrative examinations: Secondary | ICD-10-CM

## 2023-02-19 DIAGNOSIS — M47816 Spondylosis without myelopathy or radiculopathy, lumbar region: Secondary | ICD-10-CM

## 2023-02-19 DIAGNOSIS — M5126 Other intervertebral disc displacement, lumbar region: Secondary | ICD-10-CM

## 2023-02-19 NOTE — Addendum Note (Signed)
Addended byDrake Leach on: 02/19/2023 11:50 AM   Modules accepted: Orders

## 2023-02-19 NOTE — Telephone Encounter (Signed)
MRI of lumbar spine dated 02/17/23:  FINDINGS: Segmentation: 5 lumbar vertebrae. The caudal most well-formed intervertebral disc space is designated L5-S1.   Alignment: Levocurvature of the lumbar spine. Slight grade 1 retrolisthesis at L5-S1.   Vertebrae: No lumbar vertebral compression fracture. No significant marrow edema or focal suspicious osseous lesion.   Conus medullaris and cauda equina: Conus extends to the L1 level. No signal abnormality identified within the visualized distal spinal cord.   Paraspinal and other soft tissues: No acute findings within included portions of the abdomen/retroperitoneum. No paraspinal mass or collection. Nonspecific edema within the posterior paraspinal soft tissues on the left at L5-S1.   Disc levels:   Mild disc degeneration at L4-L5 and L5-S1.   T12-L1: No significant disc herniation or stenosis.   L1-L2: No significant disc herniation or stenosis.   L2-L3: No significant disc herniation or stenosis.   L3-L4: Disc bulge. No significant spinal canal stenosis. Mild relative right neural foraminal narrowing.   L4-L5: Disc bulge. Superimposed large broad-based left subarticular to left extraforaminal T2 hyperintense disc protrusion (at site of posterior annular fissure). No significant subarticular or central canal stenosis. However, the disc bulge and posterior annular fissure closely approximate the descending left L5 nerve root (and could potentially irritate this nerve root). The disc protrusion also contributes to moderate left neural foraminal narrowing, and encroaches upon the exiting left L4 nerve root beyond the neural foramen. Mild relative right neural foraminal narrowing.   L5-S1: Central to left foraminal zone posterior annular fissure. Disc bulge asymmetric to the left. Minimal endplate spurring. Trace bilateral facet joint effusions. The disc bulge results in mild relative left subarticular narrowing, with subtle  posterior displacement of the descending left S1 nerve root. Moderate left neural foraminal narrowing with potential to affect the exiting left L5 nerve root. Mild relative right neural foraminal narrowing.   IMPRESSION: 1. Lumbar spondylosis as outlined and with findings most notably as follows. 2. At L4-L5, there is mild disc degeneration. Disc bulge. Superimposed large broad-based left subarticular to left extraforaminal disc protrusion (at site of posterior annular fissure). No significant subarticular or central canal stenosis. However, the disc bulge and posterior annular fissure closely approximate the descending left L5 nerve root (and could potentially irritate this nerve root). The disc protrusion also contributes to moderate left neural foraminal narrowing, and encroaches upon the exiting left L4 nerve root beyond the neural foramen. 3. At L5-S1, there is slight grade 1 retrolisthesis. Mild disc degeneration. Posterior annular fissure. Disc bulge asymmetric to the left with minimal endplate spurring. Trace bilateral facet joint effusions. The disc bulge results in mild relative left subarticular narrowing, with subtle posterior displacement of the descending left S1 nerve root. Multifactorial moderate left neural foraminal narrowing with potential to affect the exiting left L5 nerve root. Additional sites 4. Additional sites of mild foraminal stenosis as described. 5. Mild Levocurvature of the lumbar spine. 6. Nonspecific edema within the posterior paraspinal soft tissues on the left at L5-S1.     Electronically Signed   By: Jackey Loge D.O.   On: 02/17/2023 19:01   I have personally reviewed the images and agree with the above interpretation.  He has left sided disc at L4-L5 that likely irritates left L5 nerve along with moderate left/mild right foraminal stenosis. Disc bulge/facet hypertrophy at L5-S1 that possibly displaces left S1 nerve root with moderate  left/mild right foraminal stenosis.   Recommend referral to pain management (Lateef) to consider lumbar injections. Message sent to patient.

## 2023-02-20 LAB — PROLACTIN: Prolactin: 4.5 ng/mL (ref 3.9–22.7)

## 2023-02-20 LAB — LUTEINIZING HORMONE: LH: 4.1 m[IU]/mL (ref 1.7–8.6)

## 2023-02-20 LAB — INSULIN-LIKE GROWTH FACTOR: Insulin-Like GF-1: 134 ng/mL (ref 90–278)

## 2023-02-20 LAB — TSH RFX ON ABNORMAL TO FREE T4: TSH: 0.762 u[IU]/mL (ref 0.450–4.500)

## 2023-02-20 LAB — FOLLICLE STIMULATING HORMONE: FSH: 3.4 m[IU]/mL (ref 1.5–12.4)

## 2023-03-06 ENCOUNTER — Ambulatory Visit
Payer: BC Managed Care – PPO | Attending: Student in an Organized Health Care Education/Training Program | Admitting: Student in an Organized Health Care Education/Training Program

## 2023-03-06 ENCOUNTER — Encounter: Payer: Self-pay | Admitting: Student in an Organized Health Care Education/Training Program

## 2023-03-06 VITALS — BP 186/120 | HR 78 | Temp 98.2°F | Resp 16 | Ht 68.0 in | Wt 247.0 lb

## 2023-03-06 DIAGNOSIS — M5416 Radiculopathy, lumbar region: Secondary | ICD-10-CM | POA: Diagnosis present

## 2023-03-06 DIAGNOSIS — M48061 Spinal stenosis, lumbar region without neurogenic claudication: Secondary | ICD-10-CM | POA: Diagnosis present

## 2023-03-06 DIAGNOSIS — M5116 Intervertebral disc disorders with radiculopathy, lumbar region: Secondary | ICD-10-CM | POA: Diagnosis present

## 2023-03-06 DIAGNOSIS — M79605 Pain in left leg: Secondary | ICD-10-CM | POA: Diagnosis present

## 2023-03-06 NOTE — Patient Instructions (Addendum)
Procedure instructions  Do not eat or drink fluids (other than water) for 6 hours before your procedure  No water for 2 hours before your procedure  Take your blood pressure medicine with a sip of water  Arrive 30 minutes before your appointment  Carefully read the "Preparing for your procedure" detailed instructions  If you have questions call us at [phone]  _____________________________________________________________________    ______________________________________________________________________  Preparing for your procedure  Appointments: If you think you may not be able to keep your appointment, call 24-48 hours in advance to cancel. We need time to make it available to others.  During your procedure appointment there will be: No Prescription Refills. No disability issues to discussed. No medication changes or discussions.  Instructions: Food intake: Avoid eating anything solid for at least 8 hours prior to your procedure. Clear liquid intake: You may take clear liquids such as water up to 2 hours prior to your procedure. (No carbonated drinks. No soda.) Transportation: Unless otherwise stated by your physician, bring a driver. Morning Medicines: Except for blood thinners, take all of your other morning medications with a sip of water. Make sure to take your heart and blood pressure medicines. If your blood pressure's lower number is above 100, the case will be rescheduled. Blood thinners: Make sure to stop your blood thinners as instructed.  If you take a blood thinner, but were not instructed to stop it, call our office [phone] and ask to talk to a nurse. Not stopping a blood thinner prior to certain procedures could lead to serious complications. Diabetics on insulin: Notify the staff so that you can be scheduled 1st case in the morning. If your diabetes requires high dose insulin, take only  of your normal insulin dose the morning of the procedure and  notify the staff that you have done so. Preventing infections: Shower with an antibacterial soap the morning of your procedure.  Build-up your immune system: Take 1000 mg of Vitamin C with every meal (3 times a day) the day prior to your procedure. Antibiotics: Inform the nursing staff if you are taking any antibiotics or if you have any conditions that may require antibiotics prior to procedures. (Example: recent joint implants)   Pregnancy: If you are pregnant make sure to notify the nursing staff. Not doing so may result in injury to the fetus, including death.  Sickness: If you have a cold, fever, or any active infections, call and cancel or reschedule your procedure. Receiving steroids while having an infection may result in complications. Arrival: You must be in the facility at least 30 minutes prior to your scheduled procedure. Tardiness: Your scheduled time is also the cutoff time. If you do not arrive at least 15 minutes prior to your procedure, you will be rescheduled.  Children: Do not bring any children with you. Make arrangements to keep them home. Dress appropriately: There is always a possibility that your clothing may get soiled. Avoid long dresses. Valuables: Do not bring any jewelry or valuables.  Reasons to call and reschedule or cancel your procedure: (Following these recommendations will minimize the risk of a serious complication.) Surgeries: Avoid having procedures within 2 weeks of any surgery. (Avoid for 2 weeks before or after any surgery). Flu Shots: Avoid having procedures within 2 weeks of a flu shots or . (Avoid for 2 weeks before or after immunizations). Barium: Avoid having a procedure within 7-10 days after having had a radiological study involving the use of radiological contrast. (  Myelograms, Barium swallow or enema study). Heart attacks: Avoid any elective procedures or surgeries for the initial 6 months after a "Myocardial Infarction" (Heart Attack). Blood  thinners: It is imperative that you stop these medications before procedures. Let us know if you if you take any blood thinner.  Infection: Avoid procedures during or within two weeks of an infection (including chest colds or gastrointestinal problems). Symptoms associated with infections include: Localized redness, fever, chills, night sweats or profuse sweating, burning sensation when voiding, cough, congestion, stuffiness, runny nose, sore throat, diarrhea, nausea, vomiting, cold or Flu symptoms, recent or current infections. It is specially important if the infection is over the area that we intend to treat. Heart and lung problems: Symptoms that may suggest an active cardiopulmonary problem include: cough, chest pain, breathing difficulties or shortness of breath, dizziness, ankle swelling, uncontrolled high or unusually low blood pressure, and/or palpitations. If you are experiencing any of these symptoms, cancel your procedure and contact your primary care physician for an evaluation.  Remember:  Regular Business hours are:  Monday to Thursday 8:00 AM to 4:00 PM  Provider's Schedule: Francisco Naveira, MD:  Procedure days: Tuesday and Thursday 7:30 AM to 4:00 PM  Bilal Lateef, MD:  Procedure days: Monday and Wednesday 7:30 AM to 4:00 PM  Epidural Steroid Injection Patient Information  Description: The epidural space surrounds the nerves as they exit the spinal cord.  In some patients, the nerves can be compressed and inflamed by a bulging disc or a tight spinal canal (spinal stenosis).  By injecting steroids into the epidural space, we can bring irritated nerves into direct contact with a potentially helpful medication.  These steroids act directly on the irritated nerves and can reduce swelling and inflammation which often leads to decreased pain.  Epidural steroids may be injected anywhere along the spine and from the neck to the low back depending upon the location of your pain.   After  numbing the skin with local anesthetic (like Novocaine), a small needle is passed into the epidural space slowly.  You may experience a sensation of pressure while this is being done.  The entire block usually last less than 10 minutes.  Conditions which may be treated by epidural steroids:  Low back and leg pain Neck and arm pain Spinal stenosis Post-laminectomy syndrome Herpes zoster (shingles) pain Pain from compression fractures  Preparation for the injection:  Do not eat any solid food or dairy products within 8 hours of your appointment.  You may drink clear liquids up to 3 hours before appointment.  Clear liquids include water, black coffee, juice or soda.  No milk or cream please. You may take your regular medication, including pain medications, with a sip of water before your appointment  Diabetics should hold regular insulin (if taken separately) and take 1/2 normal NPH dos the morning of the procedure.  Carry some sugar containing items with you to your appointment. A driver must accompany you and be prepared to drive you home after your procedure.  Bring all your current medications with your. An IV may be inserted and sedation may be given at the discretion of the physician.   A blood pressure cuff, EKG and other monitors will often be applied during the procedure.  Some patients may need to have extra oxygen administered for a short period. You will be asked to provide medical information, including your allergies, prior to the procedure.  We must know immediately if you are taking blood thinners (like   Coumadin/Warfarin)  Or if you are allergic to IV iodine contrast (dye). We must know if you could possible be pregnant.  Possible side-effects: Bleeding from needle site Infection (rare, may require surgery) Nerve injury (rare) Numbness & tingling (temporary) Difficulty urinating (rare, temporary) Spinal headache ( a headache worse with upright posture) Light -headedness  (temporary) Pain at injection site (several days) Decreased blood pressure (temporary) Weakness in arm/leg (temporary) Pressure sensation in back/neck (temporary)  Call if you experience: Fever/chills associated with headache or increased back/neck pain. Headache worsened by an upright position. New onset weakness or numbness of an extremity below the injection site Hives or difficulty breathing (go to the emergency room) Inflammation or drainage at the infection site Severe back/neck pain Any new symptoms which are concerning to you  Please note:  Although the local anesthetic injected can often make your back or neck feel good for several hours after the injection, the pain will likely return.  It takes 3-7 days for steroids to work in the epidural space.  You may not notice any pain relief for at least that one week.  If effective, we will often do a series of three injections spaced 3-6 weeks apart to maximally decrease your pain.  After the initial series, we generally will wait several months before considering a repeat injection of the same type.  If you have any questions, please call [phone] [facility] 

## 2023-03-06 NOTE — Progress Notes (Signed)
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 imaging, lab work, and nerve conduction tests) Review of prior analgesic pharmacotherapies. Reviewing PMP Interpreting ordered tests (e.g., lab work, imaging, nerve conduction tests) Performing post-procedure evaluations, including interpretation of diagnostic procedures Obtaining and/or reviewing separately obtained history Performing a medically appropriate examination and/or evaluation Counseling and educating the patient/family/caregiver Ordering medications, tests, or procedures Referring and communicating with other health care professionals (when not separately reported) Documenting clinical information in the electronic or other health record Independently interpreting results (not separately reported) and communicating results to the patient/  family/caregiver Care coordination (not separately reported)  Note by: Edward Jolly, MD (TTS technology used. I apologize for any typographical errors that were not detected and corrected.) Date: 03/06/2023; Time: 11:43 AM

## 2023-03-06 NOTE — Progress Notes (Signed)
Safety precautions to be maintained throughout the outpatient stay will include: orient to surroundings, keep bed in low position, maintain call bell within reach at all times, provide assistance with transfer out of bed and ambulation.  

## 2023-03-19 ENCOUNTER — Encounter: Payer: Self-pay | Admitting: Student in an Organized Health Care Education/Training Program

## 2023-03-19 ENCOUNTER — Ambulatory Visit
Payer: BC Managed Care – PPO | Attending: Student in an Organized Health Care Education/Training Program | Admitting: Student in an Organized Health Care Education/Training Program

## 2023-03-19 ENCOUNTER — Ambulatory Visit
Admission: RE | Admit: 2023-03-19 | Discharge: 2023-03-19 | Disposition: A | Discharge status: Home / Self Care | Payer: BC Managed Care – PPO | Source: Ambulatory Visit | Attending: Student in an Organized Health Care Education/Training Program | Admitting: Student in an Organized Health Care Education/Training Program

## 2023-03-19 DIAGNOSIS — M48061 Spinal stenosis, lumbar region without neurogenic claudication: Secondary | ICD-10-CM | POA: Diagnosis not present

## 2023-03-19 DIAGNOSIS — M5116 Intervertebral disc disorders with radiculopathy, lumbar region: Secondary | ICD-10-CM | POA: Diagnosis present

## 2023-03-19 DIAGNOSIS — M5416 Radiculopathy, lumbar region: Secondary | ICD-10-CM | POA: Insufficient documentation

## 2023-03-19 MED ORDER — DEXAMETHASONE SODIUM PHOSPHATE 10 MG/ML IJ SOLN
INTRAMUSCULAR | Status: AC
  Filled 2023-03-19: qty 1

## 2023-03-19 MED ORDER — ROPIVACAINE HCL 2 MG/ML IJ SOLN
2.0000 mL | Freq: Once | INTRAMUSCULAR | Status: AC
  Administered 2023-03-19: 2 mL via EPIDURAL

## 2023-03-19 MED ORDER — SODIUM CHLORIDE (PF) 0.9 % IJ SOLN
INTRAMUSCULAR | Status: AC
  Filled 2023-03-19: qty 10

## 2023-03-19 MED ORDER — LIDOCAINE HCL 2 % IJ SOLN
INTRAMUSCULAR | Status: AC
  Filled 2023-03-19: qty 20

## 2023-03-19 MED ORDER — LIDOCAINE HCL 2 % IJ SOLN
20.0000 mL | Freq: Once | INTRAMUSCULAR | Status: AC
  Administered 2023-03-19: 400 mg

## 2023-03-19 MED ORDER — ROPIVACAINE HCL 2 MG/ML IJ SOLN
INTRAMUSCULAR | Status: AC
  Filled 2023-03-19: qty 20

## 2023-03-19 MED ORDER — DEXAMETHASONE SODIUM PHOSPHATE 10 MG/ML IJ SOLN
20.0000 mg | Freq: Once | INTRAMUSCULAR | Status: AC
  Administered 2023-03-19: 20 mg

## 2023-03-19 MED ORDER — IOHEXOL 180 MG/ML  SOLN
INTRAMUSCULAR | Status: AC
  Filled 2023-03-19: qty 20

## 2023-03-19 MED ORDER — SODIUM CHLORIDE 0.9% FLUSH
2.0000 mL | Freq: Once | INTRAVENOUS | Status: AC
  Administered 2023-03-19: 2 mL

## 2023-03-19 MED ORDER — IOHEXOL 180 MG/ML  SOLN
10.0000 mL | Freq: Once | INTRAMUSCULAR | Status: AC
  Administered 2023-03-19: 10 mL via EPIDURAL

## 2023-03-19 NOTE — Progress Notes (Signed)
PROVIDER NOTE: Interpretation of information contained herein should be left to medically-trained personnel. Specific patient instructions are provided elsewhere under "Patient Instructions" section of medical record. This document was created in part using STT-dictation technology, any transcriptional errors that may result from this process are unintentional.  Patient: Benjamin Goodman. Type: Established DOB: 1985-11-08 MRN: [id number] PCP: Patient, No Pcp Per  Service: Procedure DOS: 03/19/2023 Setting: Ambulatory Location: Ambulatory outpatient facility Delivery: Face-to-face Provider: Edward Jolly, MD Specialty: Interventional Pain Management Specialty designation: 09 Location: Outpatient facility Ref. Prov.: Edward Jolly, MD       Interventional Therapy   Procedure: Lumbar trans-foraminal epidural steroid injection (L-TFESI) #1  Laterality: Left (-LT)  Level: L5 and S1 nerve root(s) Imaging: Fluoroscopy-guided         Anesthesia: Local anesthesia (1-2% Lidocaine) DOS: 03/19/2023  Performed by: Edward Jolly, MD  Purpose: Diagnostic/Therapeutic Indications: Lumbar radicular pain severe enough to impact quality of life or function. 1. Lumbar radiculopathy   2. 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 Neuroforaminal stenosis of lumbar spine    NAS-11 Pain score:   Pre-procedure: 4 /10   Post-procedure: 4 /10     Position / Prep / Materials:  Position: Prone  Prep solution: DuraPrep (Iodine Povacrylex [0.7% available iodine] and Isopropyl Alcohol, 74% w/w) Prep Area: Entire Posterior Lumbosacral Area.  From the lower tip of the scapula down to the tailbone and from flank to flank. Materials:  Tray: Block Needle(s):  Type: Spinal  Gauge (G): 22  Length: 3.5-in  Qty: 2      H&P (Pre-op Assessment):  Benjamin Goodman is a 37 y.o. (year old), male patient, seen today for interventional treatment. He  has a past surgical history that includes  Tonsillectomy. Benjamin Goodman has a current medication list which includes the following prescription(s): acetaminophen, aspirin, atorvastatin, clonidine, gabapentin, hydralazine, methocarbamol, nurtec, sacubitril-valsartan, sertraline, spironolactone, topiramate, vitamin d (ergocalciferol), furosemide, and potassium chloride sa. His primarily concern today is the Back Pain (Lower left side)  Initial Vital Signs:  Pulse/HCG Rate: (!) 102ECG Heart Rate: 94 Temp: (!) 97.2 F (36.2 C) Resp: 15 BP: (!) 160/105 (Dr Letitia Libra notified) SpO2: 100 %  BMI: Estimated body mass index is 38.47 kg/m as calculated from the following:   Height as of this encounter: 5\' 8"  (1.727 m).   Weight as of this encounter: 253 lb (114.8 kg).  Risk Assessment: Allergies: Reviewed. He has No Known Allergies.  Allergy Precautions: None required Coagulopathies: Reviewed. None identified.  Blood-thinner therapy: None at this time Active Infection(s): Reviewed. None identified. Benjamin Goodman is afebrile  Site Confirmation: Benjamin Goodman was asked to confirm the procedure and laterality before marking the site Procedure checklist: Completed Consent: Before the procedure and under the influence of no sedative(s), amnesic(s), or anxiolytics, the patient was informed of the treatment options, risks and possible complications. To fulfill our ethical and legal obligations, as recommended by the American Medical Association's Code of Ethics, I have informed the patient of my clinical impression; the nature and purpose of the treatment or procedure; the risks, benefits, and possible complications of the intervention; the alternatives, including doing nothing; the risk(s) and benefit(s) of the alternative treatment(s) or procedure(s); and the risk(s) and benefit(s) of doing nothing. The patient was provided information about the general risks and possible complications associated with the procedure. These may include, but are not limited to:  failure to achieve desired goals, infection, bleeding, organ or nerve damage, allergic reactions, paralysis, and  death. In addition, the patient was informed of those risks and complications associated to Spine-related procedures, such as failure to decrease pain; infection (i.e.: Meningitis, epidural or intraspinal abscess); bleeding (i.e.: epidural hematoma, subarachnoid hemorrhage, or any other type of intraspinal or peri-dural bleeding); organ or nerve damage (i.e.: Any type of peripheral nerve, nerve root, or spinal cord injury) with subsequent damage to sensory, motor, and/or autonomic systems, resulting in permanent pain, numbness, and/or weakness of one or several areas of the body; allergic reactions; (i.e.: anaphylactic reaction); and/or death. Furthermore, the patient was informed of those risks and complications associated with the medications. These include, but are not limited to: allergic reactions (i.e.: anaphylactic or anaphylactoid reaction(s)); adrenal axis suppression; blood sugar elevation that in diabetics may result in ketoacidosis or comma; water retention that in patients with history of congestive heart failure may result in shortness of breath, pulmonary edema, and decompensation with resultant heart failure; weight gain; swelling or edema; medication-induced neural toxicity; particulate matter embolism and blood vessel occlusion with resultant organ, and/or nervous system infarction; and/or aseptic necrosis of one or more joints. Finally, the patient was informed that Medicine is not an exact science; therefore, there is also the possibility of unforeseen or unpredictable risks and/or possible complications that may result in a catastrophic outcome. The patient indicated having understood very clearly. We have given the patient no guarantees and we have made no promises. Enough time was given to the patient to ask questions, all of which were answered to the patient's satisfaction. Benjamin Goodman has indicated that he wanted to continue with the procedure. Attestation: I, the ordering provider, attest that I have discussed with the patient the benefits, risks, side-effects, alternatives, likelihood of achieving goals, and potential problems during recovery for the procedure that I have provided informed consent. Date  Time: 03/19/2023 10:14 AM   Pre-Procedure Preparation:  Monitoring: As per clinic protocol. Respiration, ETCO2, SpO2, BP, heart rate and rhythm monitor placed and checked for adequate function Safety Precautions: Patient was assessed for positional comfort and pressure points before starting the procedure. Time-out: I initiated and conducted the "Time-out" before starting the procedure, as per protocol. The patient was asked to participate by confirming the accuracy of the "Time Out" information. Verification of the correct person, site, and procedure were performed and confirmed by me, the nursing staff, and the patient. "Time-out" conducted as per Joint Commission's Universal Protocol (UP.01.01.01). Time: 1043 Start Time: 1043 hrs.  Description/Narrative of Procedure:          Target: The 6 o'clock position under the pedicle, on the affected side. Region: Posterolateral Lumbosacral Approach: Posterior Percutaneous Paravertebral approach.  Rationale (medical necessity): procedure needed and proper for the diagnosis and/or treatment of the patient's medical symptoms and needs. Procedural Technique Safety Precautions: Aspiration looking for blood return was conducted prior to all injections. At no point did we inject any substances, as a needle was being advanced. No attempts were made at seeking any paresthesias. Safe injection practices and needle disposal techniques used. Medications properly checked for expiration dates. SDV (single dose vial) medications used. Description of the Procedure: Protocol guidelines were followed. The patient was placed in position over  the procedure table. The target area was identified and the area prepped in the usual manner. Skin & deeper tissues infiltrated with local anesthetic. Appropriate amount of time allowed to pass for local anesthetics to take effect. The procedure needles were then advanced to the target area. Proper needle placement secured. Negative aspiration confirmed.  Solution injected in intermittent fashion, asking for systemic symptoms every 0.5cc of injectate. The needles were then removed and the area cleansed, making sure to leave some of the prepping solution back to take advantage of its long term bactericidal properties.  Vitals:   03/19/23 1019 03/19/23 1040 03/19/23 1045 03/19/23 1050  BP: (!) 164/103 (!) 169/115 (!) 178/117 (!) 178/116  Pulse:      Resp:  15 20 19   Temp:      SpO2:  100% 100% 100%  Weight:      Height:        Start Time: 1043 hrs. End Time: 1049 hrs.  Imaging Guidance (Spinal):          Type of Imaging Technique: Fluoroscopy Guidance (Spinal) Indication(s): Assistance in needle guidance and placement for procedures requiring needle placement in or near specific anatomical locations not easily accessible without such assistance. Exposure Time: Please see nurses notes. Contrast: Before injecting any contrast, we confirmed that the patient did not have an allergy to iodine, shellfish, or radiological contrast. Once satisfactory needle placement was completed at the desired level, radiological contrast was injected. Contrast injected under live fluoroscopy. No contrast complications. See chart for type and volume of contrast used. Fluoroscopic Guidance: I was personally present during the use of fluoroscopy. "Tunnel Vision Technique" used to obtain the best possible view of the target area. Parallax error corrected before commencing the procedure. "Direction-depth-direction" technique used to introduce the needle under continuous pulsed fluoroscopy. Once target was reached,  antero-posterior, oblique, and lateral fluoroscopic projection used confirm needle placement in all planes. Images permanently stored in EMR. Interpretation: I personally interpreted the imaging intraoperatively. Adequate needle placement confirmed in multiple planes. Appropriate spread of contrast into desired area was observed. No evidence of afferent or efferent intravascular uptake. No intrathecal or subarachnoid spread observed. Permanent images saved into the patient's record.  Post-operative Assessment:  Post-procedure Vital Signs:  Pulse/HCG Rate: (!) 10297 Temp: (!) 97.2 F (36.2 C) Resp: 19 BP: (!) 178/116 SpO2: 100 %  EBL: None  Complications: No immediate post-treatment complications observed by team, or reported by patient.  Note: The patient tolerated the entire procedure well. A repeat set of vitals were taken after the procedure and the patient was kept under observation following institutional policy, for this type of procedure. Post-procedural neurological assessment was performed, showing return to baseline, prior to discharge. The patient was provided with post-procedure discharge instructions, including a section on how to identify potential problems. Should any problems arise concerning this procedure, the patient was given instructions to immediately contact us, at any time, without hesitation. In any case, we plan to contact the patient by telephone for a follow-up status report regarding this interventional procedure.  Comments:  No additional relevant information.  Plan of Care (POC)  Orders:  Orders Placed This Encounter  Procedures   DG PAIN CLINIC C-ARM 1-60 MIN NO REPORT    Intraoperative interpretation by procedural physician at [facility].    Standing Status:   Standing    Number of Occurrences:   1    Order Specific Question:   Reason for exam:    Answer:   Assistance in needle guidance and placement for procedures requiring needle placement  in or near specific anatomical locations not easily accessible without such assistance.   Medications ordered for procedure: Meds ordered this encounter  Medications   iohexol (OMNIPAQUE) 180 MG/ML injection 10 mL    Must be Myelogram-compatible. If not available, you  may substitute with a water-soluble, non-ionic, hypoallergenic, myelogram-compatible radiological contrast medium.   lidocaine (XYLOCAINE) 2 % (with pres) injection 400 mg   sodium chloride flush (NS) 0.9 % injection 2 mL    This is for a two (2) level block. Use two (2) syringes and divide content in half.   ropivacaine (PF) 2 mg/mL (0.2%) (NAROPIN) injection 2 mL    This is for a two (2) level block. Use two (2) syringes and divide content in half.   dexamethasone (DECADRON) injection 20 mg    This is for a two (2) level block. Use two (2) syringes and divide content in half.   Medications administered: We administered iohexol, lidocaine, sodium chloride flush, ropivacaine (PF) 2 mg/mL (0.2%), and dexamethasone.  See the medical record for exact dosing, route, and time of administration.  Follow-up plan:   Return in about 4 weeks (around 04/16/2023) for VV PPE .       Left L5 and S1 TF ESI    Recent Visits Date Type Provider Dept  03/06/23 Office Visit Edward Jolly, MD [facility]  Showing recent visits within past 90 days and meeting all other requirements Today's Visits Date Type Provider Dept  03/19/23 Procedure visit Edward Jolly, MD [facility]  Showing today's visits and meeting all other requirements Future Appointments Date Type Provider Dept  04/23/23 Appointment Edward Jolly, MD [facility]  Showing future appointments within next 90 days and meeting all other requirements  Disposition: Discharge home  Discharge (Date  Time): 03/19/2023; 1054 hrs.   Primary Care Physician: Patient, No Pcp Per Location: [facility] Note by: Edward Jolly,  MD (TTS technology used. I apologize for any typographical errors that were not detected and corrected.) Date: 03/19/2023; Time: 10:59 AM  Disclaimer:  Medicine is not an Visual merchandiser. The only guarantee in medicine is that nothing is guaranteed. It is important to note that the decision to proceed with this intervention was based on the information collected from the patient. The Data and conclusions were drawn from the patient's questionnaire, the interview, and the physical examination. Because the information was provided in large part by the patient, it cannot be guaranteed that it has not been purposely or unconsciously manipulated. Every effort has been made to obtain as much relevant data as possible for this evaluation. It is important to note that the conclusions that lead to this procedure are derived in large part from the available data. Always take into account that the treatment will also be dependent on availability of resources and existing treatment guidelines, considered by other Pain Management Practitioners as being common knowledge and practice, at the time of the intervention. For Medico-Legal purposes, it is also important to point out that variation in procedural techniques and pharmacological choices are the acceptable norm. The indications, contraindications, technique, and results of the above procedure should only be interpreted and judged by a Board-Certified Interventional Pain Specialist with extensive familiarity and expertise in the same exact procedure and technique.

## 2023-03-19 NOTE — Patient Instructions (Signed)

## 2023-03-19 NOTE — Progress Notes (Signed)
Safety precautions to be maintained throughout the outpatient stay will include: orient to surroundings, keep bed in low position, maintain call bell within reach at all times, provide assistance with transfer out of bed and ambulation.  

## 2023-03-20 ENCOUNTER — Telehealth: Payer: Self-pay | Admitting: *Deleted

## 2023-03-20 NOTE — Telephone Encounter (Signed)
No problems post procedure. 

## 2023-03-20 NOTE — Telephone Encounter (Signed)
error 

## 2023-04-03 NOTE — Progress Notes (Signed)
CC:  post-concussive syndrome  Follow-up Visit  Last visit: 09/18/2022 Dr. Delena Bali  Brief HPI: 37 year old male with a history of HTN, CHF, OSA, TMJ who follows in clinic for post-concussive syndrome following a head trauma 03/11/22. CTH and C-spine showed no acute process.  Suffered a second head injury with concussion in 08/2022.  At prior visit, recommended increasing topiramate for worsening headaches post second head injury, advised to restart Nurtec for rescue and referred to PT for cervicalgia.  Interval History:  Headaches have been well-controlled since prior visit, currently having about 1 headache per week, currently taking topiramate 25mg  TID but does cause drowsiness and dizziness especially if he forgets to eat with dosage, headaches usually lasts 8-9 hours. Use of Nurtec which decreases severity but does abort headache.  Blood pressure today elevated, has not yet taken AM blood pressure medications, does not routinely monitor at home.  Asymptomatic.  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      Prior Therapies                                 Amlodipine 10 mg daily Lisinopril 20 mg daily Metoprolol 200 mg daily Zoloft 100 mg daily Topamax 25mg  TID Nurtec 75 mg QHS     Current Outpatient Medications on File Prior to Visit  Medication Sig Dispense Refill   acetaminophen (TYLENOL) 500 MG tablet Take 500 mg by mouth every 6 (six) hours as needed.     aspirin 81 MG chewable tablet Chew 81 mg by mouth daily.     atorvastatin (LIPITOR) 20 MG tablet Take 1 tablet by mouth daily.     furosemide (LASIX) 40 MG tablet Take 1 tablet (40 mg total) by mouth daily. 30 tablet 0   gabapentin (NEURONTIN) 300 MG capsule 1 po q hs x 5 days, then 1 po bid x 5 days,  then 1 po tid. 90 capsule 0   hydrALAZINE (APRESOLINE) 50 MG tablet Take 1 tablet (50 mg total) by mouth 3 (three) times daily. 270 tablet 3   methocarbamol (ROBAXIN) 500 MG tablet Take 1 tablet (500 mg total) by mouth every 8 (eight) hours as needed for muscle spasms. 60 tablet 0   NURTEC 75 MG TBDP Dissolve 1 tablet (75 mg total) in the mouth Take as directed.     sacubitril-valsartan (ENTRESTO) 97-103 MG Take 1 tablet by mouth 2 (two) times daily. 180 tablet 3   sertraline (ZOLOFT) 100 MG tablet TAKE 1 TABLET (100 MG TOTAL) BY MOUTH DAILY. FUTURE REFILLS FROM PCP 90 tablet 1   spironolactone (ALDACTONE) 25 MG tablet Take 1 tablet (25 mg total) by mouth daily. 90 tablet 3   Vitamin D, Ergocalciferol, (DRISDOL) 1.25 MG (50000 UNIT) CAPS capsule Take 50,000 Units by mouth once a week.     cloNIDine (CATAPRES) 0.2 MG tablet Take 1 tablet (0.2 mg total) by mouth 2 (two) times daily. 60 tablet 0   No current facility-administered medications on file prior to visit.   Past Medical History:  Diagnosis Date   CHF (congestive heart failure) (HCC)    Headache    Hypertension    Sleep apnea    occass CPAP user   Past Surgical History:  Procedure Laterality Date   TONSILLECTOMY     WISDOM TOOTH EXTRACTION Bilateral       Physical Exam:   Vital Signs: BP (!) 186/121   Pulse 70   Ht 5\' 8"  (1.727 m)   Wt 253 lb (114.8 kg)   BMI 38.47 kg/m  GENERAL:  well appearing, in no acute distress, alert  SKIN:  Color, texture, turgor normal. No rashes or lesions HEAD:  Normocephalic/atraumatic. RESP: normal respiratory effort MSK:  +tenderness to palpation over left neck and shoulders  NEUROLOGICAL: Mental Status: Alert, oriented to person, place and time, Follows commands, and Speech fluent and appropriate. Cranial Nerves: PERRL, face symmetric, no dysarthria, hearing grossly intact Motor: moves all extremities equally Gait: normal-based.      IMPRESSION: 36 year old male with a  history of HTN, CHF, OSA, TMJ who presents for follow up of post-concussive syndrome in 03/2022. He has unfortunately suffered a second head trauma and concussion in 08/2022 which worsened his headaches but headaches have since improved, currently about 1 headache per week, use of Nurtec with limited benefit.  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    PLAN: -Prevention: Continue Topamax but recommend taking 75 mg nightly (currently taking 25mg  tab TID). Advised to call if side effects still persist and can consider alternative agent -Rescue: Try Ubrelvy 100 mg as needed, can repeat times 2:01 hours if needed.  Max dose 200 mg per 24-hour. If no benefit, can return back to Nurtec or advised to call if he wishes to try alternative agent -advised to monitor blood pressure at home, discussed potential symptoms and if becomes symptomatic, to proceed to ED for further evaluation, continue to follow with cardiology for ongoing management    Follow-up in 6 months or call earlier if needed    I spent 20 minutes of face-to-face and non-face-to-face time with patient.  This included previsit chart review, lab review, study review, order entry, electronic health record documentation, patient education and discussion regarding above diagnoses and treatment plan and answered all other questions to patient's satisfaction  Ihor Austin, [facility]  [facility] [address] [place], [place] [address]  Phone [phone] Fax [phone] Note: This document was prepared with digital dictation and possible smart phrase technology. Any transcriptional errors that result from this process are unintentional.
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
## 2023-04-07 ENCOUNTER — Encounter: Payer: Self-pay | Admitting: Adult Health

## 2023-04-07 ENCOUNTER — Ambulatory Visit: Payer: BC Managed Care – PPO | Admitting: Adult Health

## 2023-04-07 VITALS — BP 186/121 | HR 70 | Ht 68.0 in | Wt 253.0 lb

## 2023-04-07 DIAGNOSIS — G44329 Chronic post-traumatic headache, not intractable: Secondary | ICD-10-CM

## 2023-04-07 DIAGNOSIS — G43009 Migraine without aura, not intractable, without status migrainosus: Secondary | ICD-10-CM

## 2023-04-07 MED ORDER — UBRELVY 100 MG PO TABS
100.0000 mg | ORAL_TABLET | ORAL | 11 refills | Status: DC | PRN
Start: 2023-04-07 — End: 2023-10-09

## 2023-04-07 MED ORDER — TOPIRAMATE 25 MG PO TABS
75.0000 mg | ORAL_TABLET | Freq: Every day | ORAL | 3 refills | Status: DC
Start: 2023-04-07 — End: 2023-10-09

## 2023-04-07 NOTE — Patient Instructions (Addendum)
Your Plan:  Continue topiramate 75mg  nightly for headache prevention  - please let me know if you are interested in increasing further if tolerating this dosage at night well, if you continue to have side effects please let me know   Try Bernita Raisin to take at onset of headache, can repeat x2 hrs if needed, max dose 2 tablets in a 24 hr period - hopefully this will help stop the headache all together. If no benefit, you can return back to Nurtec but if interested in trying alternative agent, please let me know!      Follow up in 6 months or call earlier if needed     Thank you for coming to see [place] at [facility]. I hope we have been able to provide you high quality care today.  You may receive a patient satisfaction survey over the next few weeks. We would appreciate your feedback and comments so that we may continue to improve ourselves and the health of our patients.

## 2023-04-09 ENCOUNTER — Ambulatory Visit: Payer: BC Managed Care – PPO | Admitting: Orthopedic Surgery

## 2023-04-15 ENCOUNTER — Encounter: Payer: Self-pay | Admitting: Orthopedic Surgery

## 2023-04-22 ENCOUNTER — Telehealth: Payer: Self-pay

## 2023-04-22 NOTE — Telephone Encounter (Signed)
Attempt to contact patient for pre-appointment review of post-procedure. VM not available.

## 2023-04-23 ENCOUNTER — Ambulatory Visit
Payer: BC Managed Care – PPO | Attending: Student in an Organized Health Care Education/Training Program | Admitting: Student in an Organized Health Care Education/Training Program

## 2023-04-23 ENCOUNTER — Telehealth: Payer: Self-pay

## 2023-04-23 DIAGNOSIS — M5416 Radiculopathy, lumbar region: Secondary | ICD-10-CM

## 2023-04-23 NOTE — Telephone Encounter (Signed)
Attempt to contact patient for nursing pre-appointment questions for today's VV with Dr. Cherylann Ratel. No answer and VM not set up.

## 2023-04-23 NOTE — Progress Notes (Signed)
I attempted to call the patient however no response. Voicemail left instructing patient to call front desk office at [phone] to reschedule appointment. -Dr Yamileth Hayse  

## 2023-06-03 ENCOUNTER — Ambulatory Visit
Admission: EM | Admit: 2023-06-03 | Discharge: 2023-06-03 | Disposition: A | Discharge status: Home / Self Care | Payer: BC Managed Care – PPO | Attending: Emergency Medicine | Admitting: Emergency Medicine

## 2023-06-03 DIAGNOSIS — J069 Acute upper respiratory infection, unspecified: Secondary | ICD-10-CM

## 2023-06-03 LAB — RESP PANEL BY RT-PCR (FLU A&B, COVID) ARPGX2
Influenza A by PCR: NEGATIVE
Influenza B by PCR: NEGATIVE
SARS Coronavirus 2 by RT PCR: NEGATIVE

## 2023-06-03 LAB — GROUP A STREP BY PCR: Group A Strep by PCR: NOT DETECTED

## 2023-06-03 MED ORDER — BENZONATATE 100 MG PO CAPS: 100.0000 mg | ORAL_CAPSULE | Freq: Three times a day (TID) | ORAL | 0 refills | Status: DC

## 2023-06-03 NOTE — Discharge Instructions (Addendum)
Flu, COVID and strep are all negative, take your blood pressure medication as soon as you get home.  If you have new or worsening symptoms go to the emergency room for further evaluation.  May take over-the-counter Coricidin HBP for symptom management as this medication will not make your blood pressure go up.

## 2023-06-03 NOTE — ED Provider Notes (Signed)
[facility]    CSN: [id number] Arrival date & time: 06/03/23  9528      History   Chief Complaint Chief Complaint  Patient presents with   Cough   Nasal Congestion   Headache    HPI Benjamin Goodman. is a 37 y.o. male.   37 year old male patient, Benjamin, Goodman., presents to urgent care for evaluation of cough nasal congestion headache that started yesterday.  Patient has elevated blood pressure reading in office today has not taken his blood pressure medicine today, taken DayQuil for symptom management.  Patient significant other states that she has been exposed to URI and  strep, to get patient checked for same.  The history is provided by the patient. No language interpreter was used.    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   Left leg pain 03/06/2023   Neuroforaminal stenosis of lumbar spine 03/06/2023   Depression 04/09/2022   Class 2 obesity with body mass index (BMI) of 38.0 to 38.9 in adult 04/09/2022   Sleep apnea    Hypokalemia    Chronic combined systolic and diastolic CHF (congestive heart failure) (HCC)    Cardiomyopathy of undetermined type (HCC) 10/14/2019   Essential hypertension 09/13/2019   Hypertensive urgency 09/13/2019    Past Surgical History:  Procedure Laterality Date   TONSILLECTOMY     WISDOM TOOTH EXTRACTION Bilateral        Home Medications    Prior to Admission medications   Medication Sig Start Date End Date Taking? Authorizing Provider  aspirin 81 MG chewable tablet Chew 81 mg by mouth daily.   Yes [provider]  atorvastatin (LIPITOR) 20 MG tablet Take 1 tablet by mouth daily. 01/15/23 01/15/24 Yes [provider]  benzonatate (TESSALON) 100 MG  capsule Take 1 capsule (100 mg total) by mouth every 8 (eight) hours. 06/03/23  Yes Baley Lorimer, Para March, NP  furosemide (LASIX) 40 MG tablet Take 1 tablet (40 mg total) by mouth daily. 03/12/22  Yes Georga Hacking, MD  gabapentin (NEURONTIN) 300 MG capsule 1 po q hs x 5 days, then 1 po bid x 5 days, then 1 po tid. 01/27/23  Yes Drake Leach, PA-C  hydrALAZINE (APRESOLINE) 50 MG tablet Take 1 tablet (50 mg total) by mouth 3 (three) times daily. 03/12/22  Yes Georga Hacking, MD  methocarbamol (ROBAXIN) 500 MG tablet Take 1 tablet (500 mg total) by mouth every 8 (eight) hours as needed for muscle spasms. 01/27/23  Yes Luna, Stacy, PA-C  NURTEC 75 MG TBDP Dissolve 1 tablet (75 mg total) in the mouth Take as directed. 12/22/22  Yes [provider]  sacubitril-valsartan (ENTRESTO) 97-103 MG Take 1 tablet by mouth 2 (two) times daily. 03/26/22  Yes Hackney, Inetta Fermo A, FNP  spironolactone (ALDACTONE) 25 MG tablet Take 1 tablet (25 mg total) by mouth daily. 03/12/22  Yes Georga Hacking, MD  topiramate (TOPAMAX) 25 MG tablet Take 3 tablets (75 mg total) by mouth at bedtime. 04/07/23  Yes McCue, Shanda Bumps, NP  Ubrogepant (UBRELVY) 100 MG TABS Take 1 tablet (100 mg total) by mouth as needed. 04/07/23  Yes Ihor Austin, NP  Vitamin D, Ergocalciferol, (DRISDOL)  1.25 MG (50000 UNIT) CAPS capsule Take 50,000 Units by mouth once a week. 01/20/23  Yes [provider]  acetaminophen (TYLENOL) 500 MG tablet Take 500 mg by mouth every 6 (six) hours as needed.    [provider]  cloNIDine (CATAPRES) 0.2 MG tablet Take 1 tablet (0.2 mg total) by mouth 2 (two) times daily. 03/12/22 03/19/23  Georga Hacking, MD  sertraline (ZOLOFT) 100 MG tablet TAKE 1 TABLET (100 MG TOTAL) BY MOUTH DAILY. FUTURE REFILLS FROM PCP 04/18/22   Kara Dies, NP    Family History Family History  Problem Relation Age of Onset   Asthma Mother    Cancer Mother    Diabetes Mother    Hyperlipidemia Mother     Hypertension Mother    Headache Mother    Hypertension Father    Heart attack Maternal Uncle     Social History Social History   Tobacco Use   Smoking status: Never   Smokeless tobacco: Never  Vaping Use   Vaping status: Never Used  Substance Use Topics   Alcohol use: No   Drug use: Yes    Types: Marijuana    Comment: 03/27/22 on Sat     Allergies   Patient has no known allergies.   Review of Systems Review of Systems  Constitutional:  Negative for fever.  HENT:  Positive for congestion.   Respiratory:  Positive for cough.   Neurological:  Positive for headaches.  All other systems reviewed and are negative.    Physical Exam Triage Vital Signs ED Triage Vitals  Encounter Vitals Group     BP 06/03/23 1051 (!) 176/144     Systolic BP Percentile --      Diastolic BP Percentile --      Pulse Rate 06/03/23 1051 66     Resp 06/03/23 1051 19     Temp 06/03/23 1051 99.5 F (37.5 C)     Temp Source 06/03/23 1051 Oral     SpO2 06/03/23 1051 99 %     Weight --      Height --      Head Circumference --      Peak Flow --      Pain Score 06/03/23 1050 2     Pain Loc --      Pain Education --      Exclude from Growth Chart --    No data found.  Updated Vital Signs BP (!) 176/144 (BP Location: Left Arm)   Pulse 66   Temp 99.5 F (37.5 C) (Oral)   Resp 19   SpO2 99%   Visual Acuity Right Eye Distance:   Left Eye Distance:   Bilateral Distance:    Right Eye Near:   Left Eye Near:    Bilateral Near:     Physical Exam Vitals and nursing note reviewed.  Constitutional:      General: He is not in acute distress.    Appearance: He is well-developed. He is not ill-appearing or toxic-appearing.  HENT:     Head: Normocephalic.     Right Ear: Tympanic membrane is retracted.     Left Ear: Tympanic membrane is retracted.     Nose: Mucosal edema and congestion present.     Mouth/Throat:     Mouth: Mucous membranes are moist.     Pharynx: Uvula midline.   Eyes:     General: Lids are normal.     Conjunctiva/sclera: Conjunctivae normal.     Pupils:  Pupils are equal, round, and reactive to light.  Cardiovascular:     Rate and Rhythm: Normal rate and regular rhythm.     Pulses: Normal pulses.     Heart sounds: Normal heart sounds.  Pulmonary:     Effort: Pulmonary effort is normal. No respiratory distress.     Breath sounds: Normal breath sounds and air entry. No decreased breath sounds or wheezing.  Abdominal:     General: There is no distension.     Palpations: Abdomen is soft.  Musculoskeletal:        General: Normal range of motion.     Cervical back: Normal range of motion.  Skin:    General: Skin is warm and dry.     Findings: No rash.  Neurological:     General: No focal deficit present.     Mental Status: He is alert and oriented to person, place, and time.     GCS: GCS eye subscore is 4. GCS verbal subscore is 5. GCS motor subscore is 6.     Cranial Nerves: No cranial nerve deficit.     Sensory: No sensory deficit.  Psychiatric:        Attention and Perception: Attention normal.        Mood and Affect: Mood normal.        Speech: Speech normal.        Behavior: Behavior normal. Behavior is cooperative.      UC Treatments / Results  Labs (all labs ordered are listed, but only abnormal results are displayed) Labs Reviewed  GROUP A STREP BY PCR  RESP PANEL BY RT-PCR (FLU A&B, COVID) [id number]    EKG   Radiology No results found.  Procedures Procedures (including critical care time)  Medications Ordered in UC Medications - No data to display  Initial Impression / Assessment and Plan / UC Course  I have reviewed the triage vital signs and the nursing notes.  Pertinent labs & imaging results that were available during my care of the patient were reviewed by me and considered in my medical decision making (see chart for details).    Discussed exam findings and plan of care with patient, strict go to ER  precautions given.   Patient verbalized understanding to this provider.  Ddx: Viral URI w cough, viral illness, allergies Final Clinical Impressions(s) / UC Diagnoses   Final diagnoses:  Viral URI with cough     Discharge Instructions      Flu, COVID and strep are all negative, take your blood pressure medication as soon as you get home.  If you have new or worsening symptoms go to the emergency room for further evaluation.  May take over-the-counter Coricidin HBP for symptom management as this medication will not make your blood pressure go up.      ED Prescriptions     Medication Sig Dispense Auth. Provider   benzonatate (TESSALON) 100 MG capsule Take 1 capsule (100 mg total) by mouth every 8 (eight) hours. 21 capsule Umberto Pavek, Para March, NP      PDMP not reviewed this encounter.   Clancy Gourd, NP 06/03/23 1253

## 2023-06-03 NOTE — ED Triage Notes (Addendum)
Sx started yesterday  Headache Nasal and chest congestion Productive cough with yellow mucus No fever.   Dayquil  Patient hasn't taken his BP meds today

## 2023-06-14 ENCOUNTER — Encounter: Payer: Self-pay | Admitting: Emergency Medicine

## 2023-06-14 ENCOUNTER — Ambulatory Visit
Admission: EM | Admit: 2023-06-14 | Discharge: 2023-06-14 | Disposition: A | Discharge status: Home / Self Care | Payer: BC Managed Care – PPO | Attending: Family Medicine | Admitting: Family Medicine

## 2023-06-14 DIAGNOSIS — J019 Acute sinusitis, unspecified: Secondary | ICD-10-CM

## 2023-06-14 MED ORDER — AMOXICILLIN-POT CLAVULANATE 875-125 MG PO TABS: 1.0000 | ORAL_TABLET | Freq: Two times a day (BID) | ORAL | 0 refills | Status: DC

## 2023-06-14 NOTE — ED Triage Notes (Signed)
 Patient c/o headaches, sinus pressure and congestion, cough and chest congestion that started 06/03/23.  Patient denies recent fevers.

## 2023-06-14 NOTE — ED Provider Notes (Signed)
 [facility]    CSN: [id number] Arrival date & time: 06/14/23  9076      History   Chief Complaint Chief Complaint  Patient presents with   Sinus Problem   Cough    HPI 38 year old male with uncontrolled hypertension, OSA, and congestive heart failure presents for evaluation of the above.  Patient ports that he is been sick since Christmas Eve.  Has failed to improve.  Reports persistent congestion and sinus pain/pressure.  Some associated cough.  No fever.  Has had no relief with prior treatment.  No other complaints or concerns at this time.  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 Authorizing Provider  amoxicillin -clavulanate (AUGMENTIN ) 875-125 MG tablet Take 1 tablet by mouth every 12 (twelve) hours. 06/14/23  Yes Jakoby Melendrez G, DO  acetaminophen  (TYLENOL ) 500 MG tablet Take 500 mg by mouth every 6 (six) hours as needed.    [provider]  aspirin  81 MG chewable tablet Chew 81 mg by mouth daily.    [provider]  atorvastatin  (LIPITOR) 20 MG tablet Take 1 tablet by mouth daily. 01/15/23 01/15/24  [provider]  cloNIDine  (CATAPRES ) 0.2 MG tablet Take 1 tablet (0.2 mg  total) by mouth 2 (two) times daily. 03/12/22 03/19/23  Clide Burnard Ee, MD  furosemide  (LASIX ) 40 MG tablet Take 1 tablet (40 mg total) by mouth daily. 03/12/22   Clide Burnard Ee, MD  gabapentin  (NEURONTIN ) 300 MG capsule 1 po q hs x 5 days, then 1 po bid x 5 days, then 1 po tid. 01/27/23   Hilma Hastings, PA-C  hydrALAZINE  (APRESOLINE ) 50 MG tablet Take 1 tablet (50 mg total) by mouth 3 (three) times daily. 03/12/22   Clide Burnard Ee, MD  methocarbamol  (ROBAXIN ) 500 MG tablet Take 1 tablet (500 mg total) by mouth every 8 (eight) hours as needed for muscle spasms. 01/27/23   Hilma Hastings, PA-C  NURTEC 75 MG TBDP Dissolve 1 tablet (75 mg total) in the mouth Take as directed. 12/22/22   [provider]  sacubitril -valsartan  (ENTRESTO ) 97-103 MG Take 1 tablet by mouth 2 (two) times daily. 03/26/22   Donette Ellouise LABOR, FNP  sertraline  (ZOLOFT ) 100 MG tablet TAKE 1 TABLET (100 MG TOTAL) BY MOUTH DAILY. FUTURE REFILLS FROM PCP 04/18/22   Kaur, Charanpreet, NP  spironolactone  (ALDACTONE ) 25 MG tablet Take 1 tablet (25 mg total) by mouth daily. 03/12/22   Clide Burnard Ee, MD  topiramate  (TOPAMAX ) 25 MG tablet Take 3 tablets (75 mg total) by mouth at bedtime. 04/07/23   Whitfield Raisin, NP  Ubrogepant  (UBRELVY ) 100 MG TABS Take 1  tablet (100 mg total) by mouth as needed. 04/07/23   Whitfield Raisin, NP  Vitamin D, Ergocalciferol, (DRISDOL) 1.25 MG (50000 UNIT) CAPS capsule Take 50,000 Units by mouth once a week. 01/20/23   [provider]    Family History Family History  Problem Relation Age of Onset   Asthma Mother    Cancer Mother    Diabetes Mother    Hyperlipidemia Mother    Hypertension Mother    Headache Mother    Hypertension Father    Heart attack Maternal Uncle     Social History Social History   Tobacco Use   Smoking status: Never   Smokeless tobacco: Never  Vaping Use   Vaping status: Never Used  Substance Use Topics   Alcohol use: No   Drug use: Yes    Types:  Marijuana    Comment: 03/27/22 on Sat     Allergies   Patient has no known allergies.   Review of Systems Review of Systems Per HPI   Physical Exam Triage Vital Signs ED Triage Vitals  Encounter Vitals Group     BP 06/14/23 1049 (!) 156/100     Systolic BP Percentile --      Diastolic BP Percentile --      Pulse Rate 06/14/23 1049 96     Resp 06/14/23 1049 16     Temp 06/14/23 1049 97.9 F (36.6 C)     Temp Source 06/14/23 1049 Oral     SpO2 06/14/23 1049 99 %     Weight 06/14/23 1047 253 lb 1.4 oz (114.8 kg)     Height 06/14/23 1047 5' 9 (1.753 m)     Head Circumference --      Peak Flow --      Pain Score 06/14/23 1047 6     Pain Loc --      Pain Education --      Exclude from Growth Chart --    No data found.  Updated Vital Signs BP (!) 156/100 (BP Location: Left Arm)   Pulse 96   Temp 97.9 F (36.6 C) (Oral)   Resp 16   Ht 5' 9 (1.753 m)   Wt 114.8 kg   SpO2 99%   BMI 37.37 kg/m   Visual Acuity Right Eye Distance:   Left Eye Distance:   Bilateral Distance:    Right Eye Near:   Left Eye Near:    Bilateral Near:     Physical Exam Vitals and nursing note reviewed.  Constitutional:      General: He is not in acute distress.    Appearance: Normal appearance.  HENT:     Head: Normocephalic and atraumatic.     Right Ear: Tympanic membrane normal.     Left Ear: Tympanic membrane normal.     Nose: Congestion present.     Mouth/Throat:     Pharynx: Oropharynx is clear.  Cardiovascular:     Rate and Rhythm: Normal rate and regular rhythm.  Pulmonary:     Effort: Pulmonary effort is normal.     Breath sounds: Normal breath sounds. No wheezing or rales.  Neurological:     Mental Status: He is alert.      UC Treatments / Results  Labs (all labs ordered are listed, but only abnormal results are displayed) Labs Reviewed - No data to display  EKG   Radiology No results found.  Procedures Procedures (including critical care  time)  Medications Ordered in UC Medications -  No data to display  Initial Impression / Assessment and Plan / UC Course  I have reviewed the triage vital signs and the nursing notes.  Pertinent labs & imaging results that were available during my care of the patient were reviewed by me and considered in my medical decision making (see chart for details).    38 year old male presents with acute rhinosinusitis.  Treating with Augmentin .  Final Clinical Impressions(s) / UC Diagnoses   Final diagnoses:  Acute rhinosinusitis   Discharge Instructions   None    ED Prescriptions     Medication Sig Dispense Auth. Provider   amoxicillin -clavulanate (AUGMENTIN ) 875-125 MG tablet Take 1 tablet by mouth every 12 (twelve) hours. 14 tablet Ajani Rineer G, DO      PDMP not reviewed this encounter.   Lanis Storlie G, DO 06/14/23 1120

## 2023-07-02 IMAGING — CR DG CHEST 2V
1 series · 2 of 2 positions shown · non-contrast
Comparison: Chest radiographs 09/13/2019 and earlier.

CLINICAL DATA: 35-year-old male with cough and chest pain.
Shortness of breath with coughing.

EXAM:
CHEST - 2 VIEW

[Series 1: dg chest 2 view · 0.14mm/px · 2 of 2 slices shown]
[im 1/2]
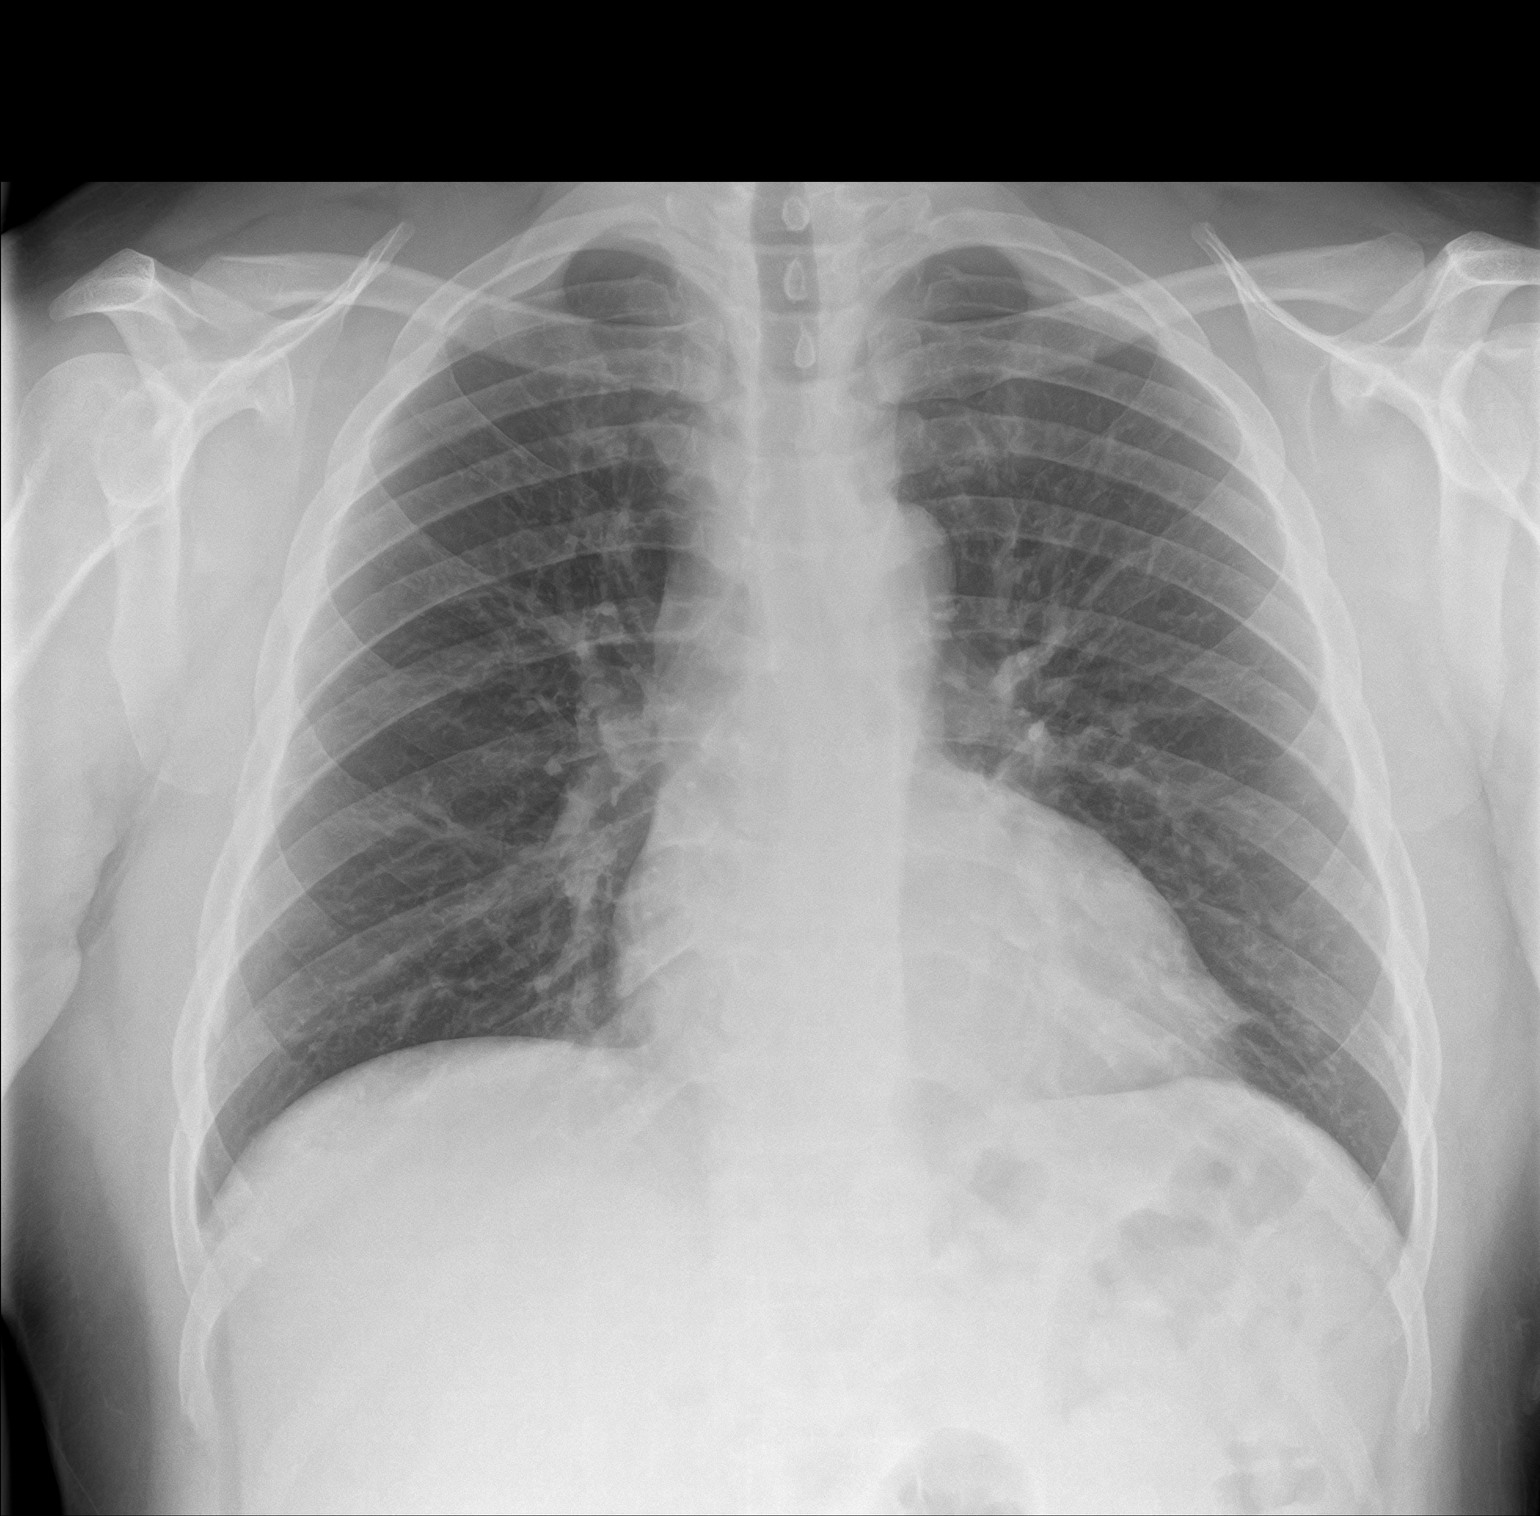
[im 2/2]
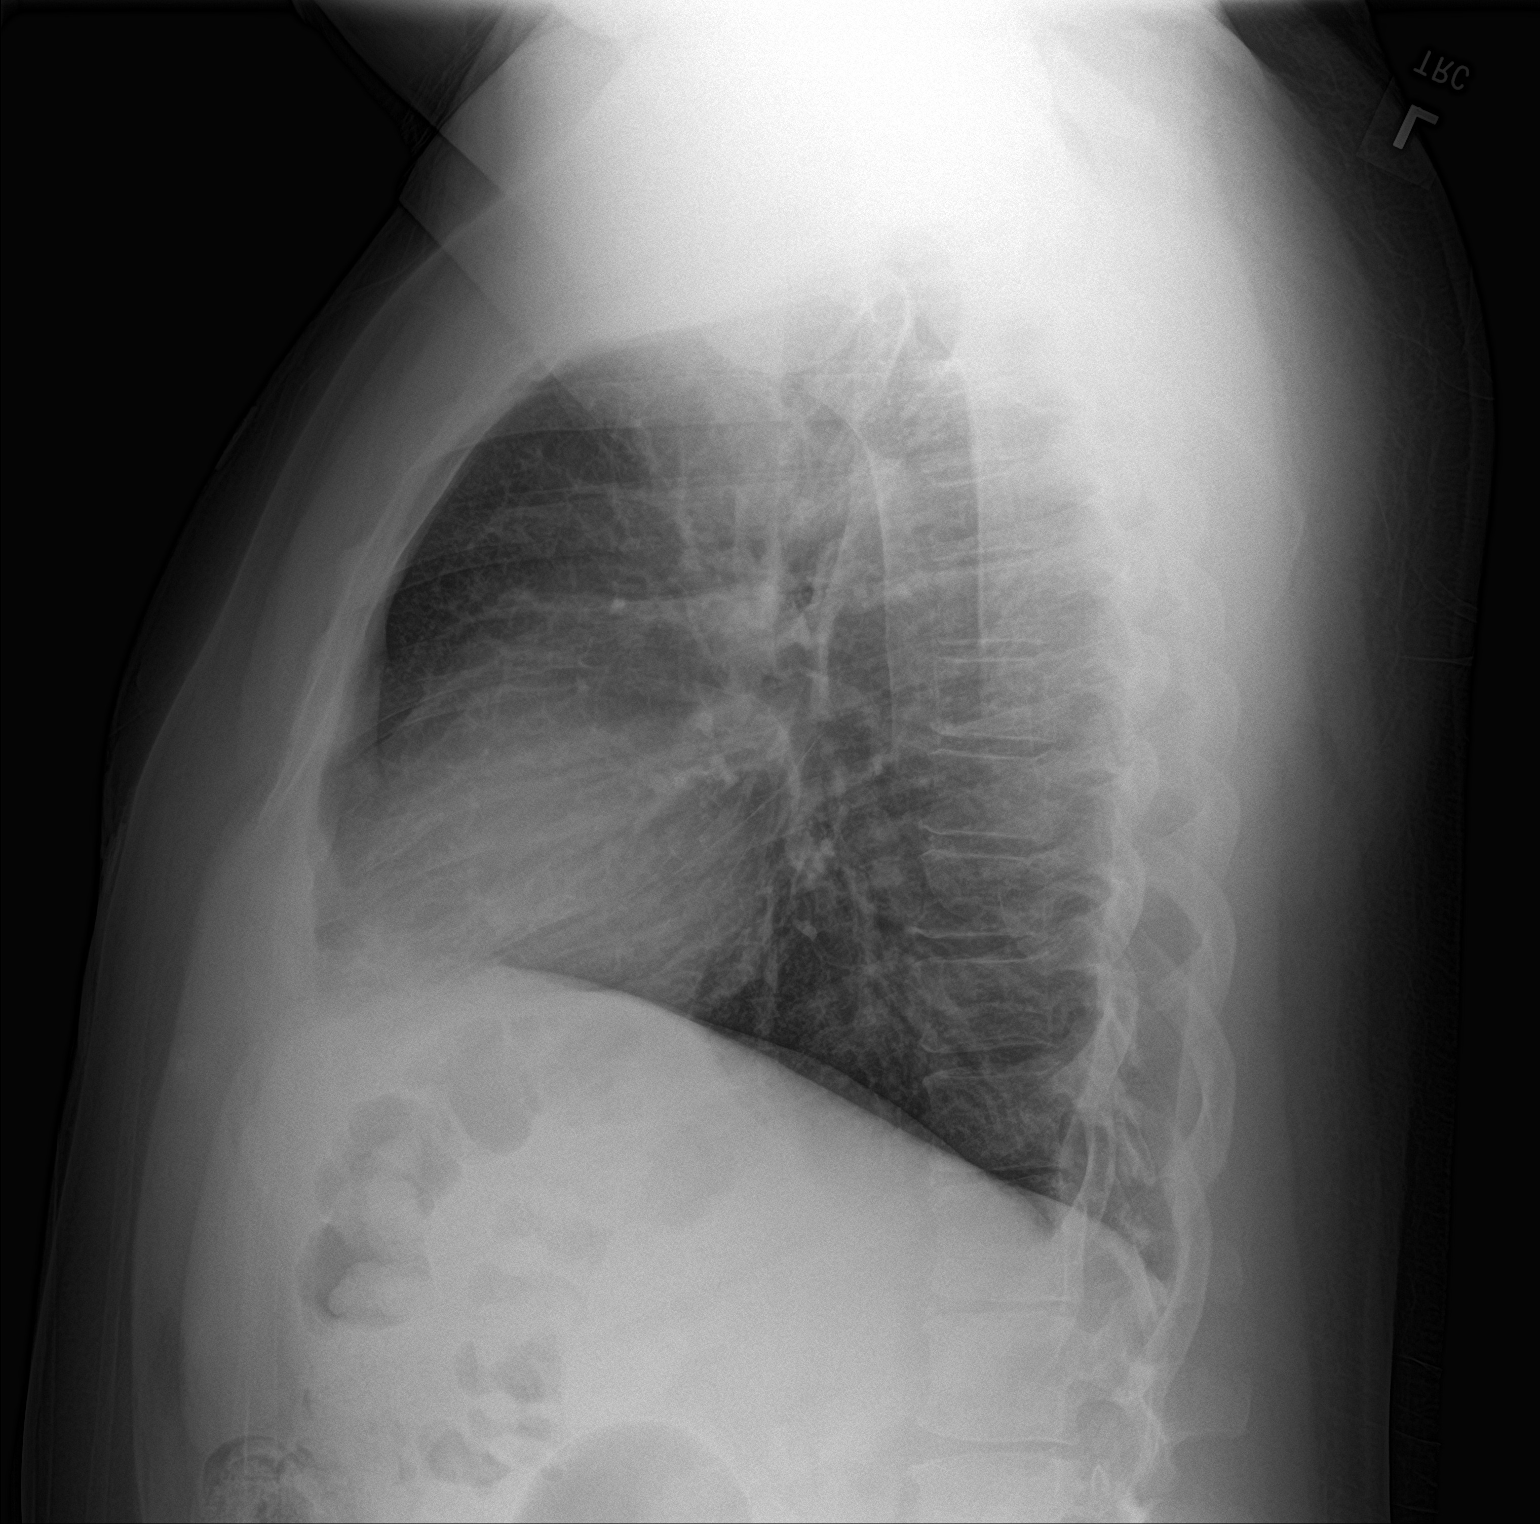

[2 of 2 positions shown; findings below may reference images not displayed]

FINDINGS: Chronically somewhat low lung volumes are stable. Mediastinal
contours remain normal. Visualized tracheal air column is within
normal limits. Ventilation has normalized since last year. Lung
markings are stable from 4545 radiographs and within normal limits.
No pneumothorax or pleural effusion. No acute osseous abnormality
identified. Negative visible bowel gas pattern.
IMPRESSION: Negative.  No cardiopulmonary abnormality.

## 2023-08-31 ENCOUNTER — Encounter: Payer: Self-pay | Admitting: Emergency Medicine

## 2023-08-31 ENCOUNTER — Ambulatory Visit
Admission: EM | Admit: 2023-08-31 | Discharge: 2023-08-31 | Disposition: A | Discharge status: Home / Self Care | Attending: Emergency Medicine | Admitting: Emergency Medicine

## 2023-08-31 DIAGNOSIS — M940 Chondrocostal junction syndrome [Tietze]: Secondary | ICD-10-CM | POA: Diagnosis present

## 2023-08-31 DIAGNOSIS — Z20822 Contact with and (suspected) exposure to covid-19: Secondary | ICD-10-CM | POA: Diagnosis present

## 2023-08-31 DIAGNOSIS — I10 Essential (primary) hypertension: Secondary | ICD-10-CM | POA: Diagnosis present

## 2023-08-31 DIAGNOSIS — R110 Nausea: Secondary | ICD-10-CM | POA: Insufficient documentation

## 2023-08-31 LAB — SARS CORONAVIRUS 2 BY RT PCR: SARS Coronavirus 2 by RT PCR: NEGATIVE

## 2023-08-31 MED ORDER — ONDANSETRON 4 MG PO TBDP: 4.0000 mg | ORAL_TABLET | Freq: Three times a day (TID) | ORAL | 0 refills | Status: AC | PRN

## 2023-08-31 MED ORDER — PAXLOVID (300/100) 20 X 150 MG & 10 X 100MG PO TBPK: 3.0000 | ORAL_TABLET | Freq: Two times a day (BID) | ORAL | 0 refills | Status: AC

## 2023-08-31 NOTE — ED Triage Notes (Signed)
 Patient c/o cough, congestion and nasal congestion that started on Friday.  Patient states that his wife currently has COVID. Patient denies fevers.

## 2023-08-31 NOTE — Discharge Instructions (Signed)
 I suspect you have COVID given your known exposure.  We did obtain a test for you today. Please start taking Paxlovid per package directions, which is 3 tablets by mouth twice daily for the next 5 days. YOU CANNOT take your Bernita Raisin while taking Paxlovid.  I have also prescribed you Zofran.  This medication is also under your tongue.  You can take 1 to 2 tablets under your tongue every 8 hours as needed to help with your nausea.  It is extremely important that you restart all of your blood pressure medications as your blood pressure is extremely elevated today.  In particular, your clonidine can cause severe abrupt rebound hypertension when stopped abruptly.  Please restart this today.  Purchase a blood pressure cuff and monitor this at home, follow-up with your primary care physician should it remain elevated.  Please read the attached handout regarding costochondritis.  Apply moist heat compress to the affected area of your right chest.  Any new or worsening symptoms please head to the emergency room

## 2023-08-31 NOTE — ED Provider Notes (Signed)
 [facility]    CSN: [id number] Arrival date & time: 08/31/23  4098      History   Chief Complaint Chief Complaint  Patient presents with   Covid Exposure   Cough    HPI Benjamin Goodman. is a 38 y.o. male.   Pleasant 38 year old male presents today due to concerns of cough, congestion, sinus pressure, nausea, headache, and right sided chest wall discomfort.  States last week his wife was diagnosed with COVID on Tuesday.  His symptoms started on Thursday, but getting worse these past two days.  He does have a history of hypertension, and states he has not taken his clonidine in 3 days due to nausea.  He endorses sinus pressure and postnasal drainage, feels that the mucus is causing his nausea.  He is having some chest wall discomfort on the right side, and states that it does hurt to touch.  No shortness of breath or left-sided chest pain.  No history of PE.  Patient does have a headache as well (has hx of migraines), but denies worst headache of his life.   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 Authorizing Provider  nirmatrelvir/ritonavir (PAXLOVID, 300/100,) 20 x 150 MG & 10 x 100MG   TBPK Take 3 tablets by mouth 2 (two) times daily for 5 days. Patient GFR is 78. Take nirmatrelvir (150 mg) two tablets twice daily for 5 days and ritonavir (100 mg) one tablet twice daily for 5 days. 08/31/23 09/05/23 Yes Mataya Kilduff L, PA  ondansetron (ZOFRAN-ODT) 4 MG disintegrating tablet Take 1 tablet (4 mg total) by mouth every 8 (eight) hours as needed for nausea or vomiting. 08/31/23  Yes Lakynn Halvorsen L, PA  acetaminophen (TYLENOL) 500 MG tablet Take 500 mg by mouth every 6 (six) hours as needed.    [provider]  aspirin 81 MG chewable tablet Chew 81 mg by mouth daily.    [provider]  atorvastatin (LIPITOR) 20 MG tablet Take 1 tablet by mouth daily. 01/15/23 01/15/24  [provider]  cloNIDine (CATAPRES) 0.2 MG tablet Take 1 tablet (0.2 mg total) by mouth 2 (two) times daily. 03/12/22 03/19/23  Georga Hacking, MD  furosemide (LASIX) 40 MG tablet Take 1 tablet (40 mg total) by mouth daily. 03/12/22   Georga Hacking, MD  gabapentin (NEURONTIN) 300 MG capsule 1 po q hs x 5 days, then 1 po bid x 5 days, then 1 po tid. 01/27/23   Drake Leach, PA-C  hydrALAZINE (APRESOLINE) 50 MG tablet Take 1 tablet (50 mg total) by mouth 3 (three) times daily.  03/12/22   Georga Hacking, MD  methocarbamol (ROBAXIN) 500 MG tablet Take 1 tablet (500 mg total) by mouth every 8 (eight) hours as needed for muscle spasms. 01/27/23   Drake Leach, PA-C  NURTEC 75 MG TBDP Dissolve 1 tablet (75 mg total) in the mouth Take as directed. 12/22/22   [provider]  sacubitril-valsartan (ENTRESTO) 97-103 MG Take 1 tablet by mouth 2 (two) times daily. 03/26/22   Delma Freeze, FNP  sertraline (ZOLOFT) 100 MG tablet TAKE 1 TABLET (100 MG TOTAL) BY MOUTH DAILY. FUTURE REFILLS FROM PCP 04/18/22   Kara Dies, NP  spironolactone (ALDACTONE) 25 MG tablet Take 1 tablet (25 mg total) by mouth daily. 03/12/22   Georga Hacking, MD  topiramate (TOPAMAX) 25 MG tablet Take 3 tablets (75  mg total) by mouth at bedtime. 04/07/23   Ihor Austin, NP  Ubrogepant (UBRELVY) 100 MG TABS Take 1 tablet (100 mg total) by mouth as needed. 04/07/23   Ihor Austin, NP  Vitamin D, Ergocalciferol, (DRISDOL) 1.25 MG (50000 UNIT) CAPS capsule Take 50,000 Units by mouth once a week. 01/20/23   [provider]    Family History Family History  Problem Relation Age of Onset   Asthma Mother    Cancer Mother    Diabetes Mother    Hyperlipidemia Mother    Hypertension Mother    Headache Mother    Hypertension Father    Heart attack Maternal Uncle     Social History Social History   Tobacco Use   Smoking status: Never   Smokeless tobacco: Never  Vaping Use   Vaping status: Never Used  Substance Use Topics   Alcohol use: No   Drug use: Yes    Types: Marijuana    Comment: 03/27/22 on Sat     Allergies   Patient has no known allergies.   Review of Systems Review of Systems  Respiratory:  Positive for cough.      Physical Exam Triage Vital Signs ED Triage Vitals  Encounter Vitals Group     BP 08/31/23 0905 (!) 193/130     Systolic BP Percentile --      Diastolic BP Percentile --      Pulse Rate 08/31/23 0905 98     Resp 08/31/23 0905 16     Temp 08/31/23 0905 98.7 F (37.1 C)     Temp Source 08/31/23 0905 Oral     SpO2 08/31/23 0905 96 %     Weight 08/31/23 0904 253 lb 1.4 oz (114.8 kg)     Height 08/31/23 0904 5\' 9"  (1.753 m)     Head Circumference --      Peak Flow --      Pain Score 08/31/23 0903 7     Pain Loc --      Pain Education --      Exclude from Growth Chart --    No data found.  Updated Vital Signs BP (!) 193/130 (BP Location: Right Arm) Comment: Has not taken medicines today  Pulse 98   Temp 98.7 F (37.1 C) (Oral)   Resp 16   Ht 5\' 9"  (1.753 m)   Wt 253 lb 1.4 oz (114.8 kg)   SpO2 96%   BMI 37.37 kg/m   Visual Acuity Right Eye Distance:   Left Eye Distance:   Bilateral Distance:    Right Eye Near:   Left Eye Near:     Bilateral Near:     Physical Exam  Vitals and nursing note reviewed.  Constitutional:      General: He is not in acute distress.    Appearance: Normal appearance. He is obese. He is not ill-appearing, toxic-appearing or diaphoretic.  HENT:     Head: Normocephalic and atraumatic.     Right Ear: Tympanic membrane, ear canal and external ear normal. There is no impacted cerumen.     Left Ear: Tympanic membrane, ear canal and external ear normal. There is no impacted cerumen.     Nose: Nose normal. No congestion or rhinorrhea.     Mouth/Throat:     Mouth: Mucous membranes are moist.     Pharynx: Oropharynx is clear. No oropharyngeal exudate or posterior oropharyngeal erythema.  Eyes:     General: No scleral icterus.       Right eye: No discharge.        Left eye: No discharge.     Extraocular Movements: Extraocular movements intact.     Pupils: Pupils are equal, round, and reactive to light.  Cardiovascular:     Rate and Rhythm: Normal rate and regular rhythm.  Pulmonary:     Effort: Pulmonary effort is normal. No respiratory distress.     Breath sounds: Normal breath sounds. No stridor. No wheezing, rhonchi or rales.  Chest:     Chest wall: Tenderness (R sternal border, reproducible to palpation) present.    Musculoskeletal:     Cervical back: Normal range of motion and neck supple. No rigidity or tenderness.     Right lower leg: No edema.     Left lower leg: No edema.  Lymphadenopathy:     Cervical: No cervical adenopathy.  Skin:    General: Skin is warm and dry.     Findings: No erythema or rash.  Neurological:     General: No focal deficit present.     Mental Status: He is alert and oriented to person, place, and time.     Cranial Nerves: No cranial nerve deficit.     Motor: No weakness.     Gait: Gait normal.  Psychiatric:        Mood and Affect: Mood normal.        Behavior: Behavior normal.      UC Treatments / Results  Labs (all labs ordered are listed, but  only abnormal results are displayed) Labs Reviewed  SARS CORONAVIRUS 2 BY RT PCR    EKG   Radiology No results found.  Procedures Procedures (including critical care time)  Medications Ordered in UC Medications - No data to display  Initial Impression / Assessment and Plan / UC Course  I have reviewed the triage vital signs and the nursing notes.  Pertinent labs & imaging results that were available during my care of the patient were reviewed by me and considered in my medical decision making (see chart for details).     Exposure to covid - pt with exposure to confirmed case of covid. Pt has numerous risk factors for covid, thus would recommend initiation of antiviral prior to test result. Last GFR 78, no hx of CKD. Costochondritis - likely related to viral infection. No cardiac sx, lung exam WNL. Supportive measures with warm moist heat, ibuprofen as needed HTN - uncontrolled secondary to pt not taking his medication for the past three days due to nausea. Suspect degree of elevation rebound HTN from abrupt cessation of clonidine use. Pt to start taking meds upon return home Nausea - possibly from viral infection/ post nasal  drainage. Zofran PRN    Final Clinical Impressions(s) / UC Diagnoses   Final diagnoses:  Exposure to confirmed case of COVID-19  Costochondritis  Essential hypertension  Nausea without vomiting     Discharge Instructions      I suspect you have COVID given your known exposure.  We did obtain a test for you today. Please start taking Paxlovid per package directions, which is 3 tablets by mouth twice daily for the next 5 days. YOU CANNOT take your Bernita Raisin while taking Paxlovid.  I have also prescribed you Zofran.  This medication is also under your tongue.  You can take 1 to 2 tablets under your tongue every 8 hours as needed to help with your nausea.  It is extremely important that you restart all of your blood pressure medications as your blood  pressure is extremely elevated today.  In particular, your clonidine can cause severe abrupt rebound hypertension when stopped abruptly.  Please restart this today.  Purchase a blood pressure cuff and monitor this at home, follow-up with your primary care physician should it remain elevated.  Please read the attached handout regarding costochondritis.  Apply moist heat compress to the affected area of your right chest.  Any new or worsening symptoms please head to the emergency room    ED Prescriptions     Medication Sig Dispense Auth. Provider   ondansetron (ZOFRAN-ODT) 4 MG disintegrating tablet Take 1 tablet (4 mg total) by mouth every 8 (eight) hours as needed for nausea or vomiting. 20 tablet Derryl Uher L, PA   nirmatrelvir/ritonavir (PAXLOVID, 300/100,) 20 x 150 MG & 10 x 100MG  TBPK Take 3 tablets by mouth 2 (two) times daily for 5 days. Patient GFR is 78. Take nirmatrelvir (150 mg) two tablets twice daily for 5 days and ritonavir (100 mg) one tablet twice daily for 5 days. 30 tablet Jaydalee Bardwell L, [place]      PDMP not reviewed this encounter.   Maretta Bees, [place] 08/31/23 1331

## 2023-10-08 ENCOUNTER — Telehealth: Payer: Self-pay | Admitting: Adult Health

## 2023-10-08 NOTE — Telephone Encounter (Signed)
 [facility] conf

## 2023-10-09 ENCOUNTER — Encounter: Payer: Self-pay | Admitting: Adult Health

## 2023-10-09 ENCOUNTER — Ambulatory Visit: Payer: BC Managed Care – PPO | Admitting: Adult Health

## 2023-10-09 VITALS — BP 182/130 | HR 89 | Ht 67.0 in | Wt 260.0 lb

## 2023-10-09 DIAGNOSIS — I10 Essential (primary) hypertension: Secondary | ICD-10-CM | POA: Diagnosis not present

## 2023-10-09 DIAGNOSIS — G44329 Chronic post-traumatic headache, not intractable: Secondary | ICD-10-CM | POA: Diagnosis not present

## 2023-10-09 DIAGNOSIS — G43009 Migraine without aura, not intractable, without status migrainosus: Secondary | ICD-10-CM | POA: Diagnosis not present

## 2023-10-09 MED ORDER — UBRELVY 100 MG PO TABS: 100.0000 mg | ORAL_TABLET | ORAL | 11 refills | Status: AC | PRN

## 2023-10-09 MED ORDER — TOPIRAMATE 25 MG PO TABS: 75.0000 mg | ORAL_TABLET | Freq: Every day | ORAL | 3 refills | Status: AC

## 2023-10-09 NOTE — Progress Notes (Signed)
 CC:  post-concussive syndrome Chief Complaint  Patient presents with   Headache    Rm 3 alone Pt is well, reports his headaches have been more frequent but he has been out of BP meds. He has at least one headache a week. He is also sleep deprived due to work.      Follow-up Visit  Last visit: 04/07/2023  Brief HPI: 38 year old male with a history of HTN, CHF, OSA, TMJ who follows in clinic for post-concussive syndrome following a head trauma 03/11/22. CTH and C-spine showed no acute process.  Suffered a second head injury with concussion in 08/2022. 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  At prior visit, reported headaches well-controlled, about 1/week, recommended continuation of Topamax  but to take 75 mg nightly (previously 25 mg TID with complaints of drowsiness and dizziness), recommended trying Ubrelvy  as needed for migraine rescue.    Interval History:  Returns for follow-up visit.  Experiencing about 1-2 headaches per week. Use of Ubrelvy  minimizes severity but does not completely abort headache.  He does report headaches have been more frequent lately and contributes this to elevated blood pressure.  Reports over the past week, blood pressure has been >200s/100s.  Reports being out of his furosemide  and hydralazine , he has remained on spironolactone  and Entresto .  He missed prior scheduled visit with cardiology and plans on contacting them to follow-up, he reports they planned on making medication adjustments. RN contacted cardiology office who noted he missed last 3 scheduled visits due to his work. They have him working from 9am-12am and needs to get up by 4am to bring his wife to work. He has only been getting about 2-3 hours of sleep per night.  Denies associated  symptoms with elevated blood pressure such as chest discomfort/pain, severe low back pain, shortness of breath, nausea/vomiting or vision changes.  Denies severe headaches or change to headache characteristics compared to his baseline.      Prior Therapies                                 Amlodipine  10 mg daily Lisinopril  20 mg daily Metoprolol  200 mg daily Zoloft  100 mg daily Topamax  25mg  TID Nurtec 75 mg at bedtime Ubrelvy      ROS:   14 system review of systems performed and negative with exception of those listed in HPI   Current Outpatient Medications on File Prior to Visit  Medication Sig Dispense Refill   acetaminophen  (TYLENOL ) 500 MG tablet Take 500 mg by mouth every 6 (six) hours as needed.     aspirin  81 MG chewable tablet Chew 81 mg by mouth daily.     atorvastatin (LIPITOR) 20 MG tablet Take 1 tablet by mouth daily.     furosemide  (LASIX ) 40 MG tablet Take 1 tablet (40 mg total) by mouth daily. 30 tablet 0   gabapentin  (NEURONTIN ) 300 MG capsule 1 po q hs x 5 days, then 1 po bid x 5 days, then 1 po tid. 90 capsule 0   hydrALAZINE  (APRESOLINE ) 50 MG tablet Take 1 tablet (50 mg total) by mouth 3 (three) times daily. 270 tablet 3   methocarbamol  (ROBAXIN ) 500 MG tablet Take 1 tablet (500 mg total) by  mouth every 8 (eight) hours as needed for muscle spasms. 60 tablet 0   ondansetron  (ZOFRAN -ODT) 4 MG disintegrating tablet Take 1 tablet (4 mg total) by mouth every 8 (eight) hours as needed for nausea or vomiting. 20 tablet 0   sacubitril -valsartan  (ENTRESTO ) 97-103 MG Take 1 tablet by mouth 2 (two) times daily. 180 tablet 3   sertraline  (ZOLOFT ) 100 MG tablet TAKE 1 TABLET (100 MG TOTAL) BY MOUTH DAILY. FUTURE REFILLS FROM PCP 90 tablet 1   spironolactone  (ALDACTONE ) 25 MG tablet Take 1 tablet (25 mg total) by mouth daily. 90 tablet 3   No current facility-administered medications on file prior to visit.   Past Medical History:  Diagnosis Date   CHF (congestive heart  failure) (HCC)    Headache    Hypertension    Sleep apnea    occass CPAP user   Past Surgical History:  Procedure Laterality Date   TONSILLECTOMY     WISDOM TOOTH EXTRACTION Bilateral       Physical Exam:   Vital Signs: Today's Vitals   10/09/23 0837 10/09/23 0847  BP: (!) 208/118 (!) 182/130  Pulse: 89   Weight: 260 lb (117.9 kg)   Height: 5\' 7"  (1.702 m)    Body mass index is 40.72 kg/m.  GENERAL:  well appearing, in no acute distress, alert  SKIN:  Color, texture, turgor normal. No rashes or lesions HEAD:  Normocephalic/atraumatic. RESP: normal respiratory effort  NEUROLOGICAL: Mental Status: Alert, oriented to person, place and time, Follows commands, and Speech fluent and appropriate. Cranial Nerves: PERRL, face symmetric, no dysarthria, hearing grossly intact Motor: moves all extremities equally Gait: normal-based.      IMPRESSION: 38 year old male with a history of HTN, CHF, OSA, TMJ who presents for follow up of post-concussive syndrome in 03/2022. He has unfortunately suffered a second head trauma and concussion in 08/2022 which worsened his headaches but headaches have since improved, currently about 1 headache per week, use of Nurtec with limited benefit.  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  Main issue today is in regards to uncontrolled blood pressure over the past week with BP >200s/100s, currently asymptomatic, ran out of furosemide  and hydralazine , missed recent visits with cardiology.    PLAN: -Prevention: Continue Topamax  75 mg nightly -Rescue: Continue Ubrelvy  100 mg as needed, can repeat times 2:01 hours if needed.  Max dose 200 mg per 24-hour. -Discussed importance of routine follow-up with cardiology and ensure medication compliance for adequate blood pressure control.  Discussed risk of continued uncontrolled blood pressure.  Advised to  continue to monitor at home.  Advised to call 911 if he should become symptomatic.  Did contact cardiology office to advise them of elevated blood pressure and plans on contacting patient to schedule follow-up visit -Discussed prolonged work schedule and lack of adequate sleep can also be contributing to uncontrolled blood pressure as well as headaches.     Follow-up in 9 months or call earlier if needed    I spent 30 minutes of face-to-face and non-face-to-face time with patient.  This included previsit chart review, lab review, study review, order entry, electronic health record documentation, patient education and discussion regarding above diagnoses and treatment plan and answered all other questions to patient's satisfaction  Johny Nap, [facility]  [facility] [address] [place], [place] [address]  Phone [phone] Fax [phone] Note: This document was prepared with digital  dictation and possible smart phrase technology. Any transcriptional errors that result from this process are unintentional.

## 2023-10-09 NOTE — Patient Instructions (Addendum)
 Your Plan:  Continue topiramate  75 mg nightly  Continue Ubrelvy  as needed for rescue  Please contact your cardiologist today to request refills and need of follow up visit.  Continue to monitor your blood pressure at home.  Please call [phone] immediately with any severe headache, vision changes, nausea/vomiting, chest pain or discomfort, severe acute low back pain or shortness of breath     Follow up in 9 months or call earlier if needed     Thank you for coming to see us  at [facility]. I hope we have been able to provide you high quality care today.  You may receive a patient satisfaction survey over the next few weeks. We would appreciate your feedback and comments so that we may continue to improve ourselves and the health of our patients.

## 2024-03-17 ENCOUNTER — Encounter: Payer: Self-pay | Admitting: Adult Health

## 2024-03-17 NOTE — Telephone Encounter (Signed)
Agree with your recommendation.  Thank you 

## 2024-04-15 ENCOUNTER — Emergency Department
Admission: EM | Admit: 2024-04-15 | Discharge: 2024-04-15 | Disposition: A | Discharge status: Home / Self Care | Attending: Emergency Medicine | Admitting: Emergency Medicine

## 2024-04-15 ENCOUNTER — Other Ambulatory Visit: Payer: Self-pay

## 2024-04-15 ENCOUNTER — Emergency Department

## 2024-04-15 DIAGNOSIS — R519 Headache, unspecified: Secondary | ICD-10-CM | POA: Diagnosis not present

## 2024-04-15 DIAGNOSIS — I110 Hypertensive heart disease with heart failure: Secondary | ICD-10-CM | POA: Insufficient documentation

## 2024-04-15 DIAGNOSIS — Z7982 Long term (current) use of aspirin: Secondary | ICD-10-CM | POA: Insufficient documentation

## 2024-04-15 DIAGNOSIS — R10A2 Flank pain, left side: Secondary | ICD-10-CM | POA: Insufficient documentation

## 2024-04-15 DIAGNOSIS — E876 Hypokalemia: Secondary | ICD-10-CM | POA: Insufficient documentation

## 2024-04-15 DIAGNOSIS — I509 Heart failure, unspecified: Secondary | ICD-10-CM | POA: Diagnosis not present

## 2024-04-15 DIAGNOSIS — R1012 Left upper quadrant pain: Secondary | ICD-10-CM | POA: Insufficient documentation

## 2024-04-15 DIAGNOSIS — Z79899 Other long term (current) drug therapy: Secondary | ICD-10-CM | POA: Insufficient documentation

## 2024-04-15 DIAGNOSIS — I10 Essential (primary) hypertension: Secondary | ICD-10-CM

## 2024-04-15 LAB — CBC
HCT: 47.4 % (ref 39.0–52.0)
Hemoglobin: 15.9 g/dL (ref 13.0–17.0)
MCH: 27.8 pg (ref 26.0–34.0)
MCHC: 33.5 g/dL (ref 30.0–36.0)
MCV: 83 fL (ref 80.0–100.0)
Platelets: 209 K/uL (ref 150–400)
RBC: 5.71 MIL/uL (ref 4.22–5.81)
RDW: 14.6 % (ref 11.5–15.5)
WBC: 6.8 K/uL (ref 4.0–10.5)
nRBC: 0 % (ref 0.0–0.2)

## 2024-04-15 LAB — BASIC METABOLIC PANEL WITH GFR
Anion gap: 11 (ref 5–15)
BUN: 14 mg/dL (ref 6–20)
CO2: 26 mmol/L (ref 22–32)
Calcium: 9 mg/dL (ref 8.9–10.3)
Chloride: 102 mmol/L (ref 98–111)
Creatinine, Ser: 1.1 mg/dL (ref 0.61–1.24)
GFR, Estimated: >60 mL/min (ref >60)
Glucose, Bld: 107 mg/dL — ABNORMAL HIGH (ref 70–99)
Potassium: 3.1 mmol/L — ABNORMAL LOW (ref 3.5–5.1)
Sodium: 139 mmol/L (ref 135–145)

## 2024-04-15 LAB — HEPATIC FUNCTION PANEL
ALT: 23 U/L (ref 0–44)
AST: 29 U/L (ref 15–41)
Albumin: 4.3 g/dL (ref 3.5–5.0)
Alkaline Phosphatase: 43 U/L (ref 38–126)
Bilirubin, Direct: 0.1 mg/dL (ref 0.0–0.2)
Indirect Bilirubin: 0.8 mg/dL (ref 0.3–0.9)
Total Bilirubin: 0.9 mg/dL (ref 0.0–1.2)
Total Protein: 8.2 g/dL — ABNORMAL HIGH (ref 6.5–8.1)

## 2024-04-15 LAB — URINALYSIS, ROUTINE W REFLEX MICROSCOPIC
Bilirubin Urine: NEGATIVE
Glucose, UA: NEGATIVE mg/dL
Hgb urine dipstick: NEGATIVE
Ketones, ur: NEGATIVE mg/dL
Leukocytes,Ua: NEGATIVE
Nitrite: NEGATIVE
Protein, ur: NEGATIVE mg/dL
Specific Gravity, Urine: 1.019 (ref 1.005–1.030)
pH: 7 (ref 5.0–8.0)

## 2024-04-15 LAB — TROPONIN I (HIGH SENSITIVITY)
Troponin I (High Sensitivity): 17 ng/L (ref <18)
Troponin I (High Sensitivity): 18 ng/L — ABNORMAL HIGH (ref <18)

## 2024-04-15 LAB — LIPASE, BLOOD: Lipase: 27 U/L (ref 11–51)

## 2024-04-15 MED ORDER — SPIRONOLACTONE 25 MG PO TABS: 25.0000 mg | ORAL_TABLET | Freq: Every day | ORAL | 2 refills | Status: AC

## 2024-04-15 MED ORDER — AMLODIPINE BESYLATE 10 MG PO TABS: 10.0000 mg | ORAL_TABLET | Freq: Every day | ORAL | 2 refills | Status: AC

## 2024-04-15 MED ORDER — LIDOCAINE 5 % EX PTCH: 1.0000 | MEDICATED_PATCH | CUTANEOUS | 0 refills | Status: AC

## 2024-04-15 MED ORDER — CHLORTHALIDONE 25 MG PO TABS: 25.0000 mg | ORAL_TABLET | Freq: Every day | ORAL | 2 refills | Status: AC

## 2024-04-15 MED ORDER — ACETAMINOPHEN 500 MG PO TABS
1000.0000 mg | ORAL_TABLET | Freq: Once | ORAL | Status: AC
  Administered 2024-04-15: 1000 mg via ORAL
  Filled 2024-04-15: qty 2

## 2024-04-15 MED ORDER — IOHEXOL 350 MG/ML SOLN
75.0000 mL | Freq: Once | INTRAVENOUS | Status: AC | PRN
  Administered 2024-04-15: 75 mL via INTRAVENOUS

## 2024-04-15 MED ORDER — CLONIDINE HCL 0.1 MG PO TABS
0.1000 mg | ORAL_TABLET | Freq: Once | ORAL | Status: AC
  Administered 2024-04-15: 0.1 mg via ORAL
  Filled 2024-04-15: qty 1

## 2024-04-15 MED ORDER — SACUBITRIL-VALSARTAN 97-103 MG PO TABS: 1.0000 | ORAL_TABLET | Freq: Two times a day (BID) | ORAL | 2 refills | Status: AC

## 2024-04-15 MED ORDER — ATORVASTATIN CALCIUM 20 MG PO TABS: 20.0000 mg | ORAL_TABLET | Freq: Every day | ORAL | 2 refills | Status: AC

## 2024-04-15 MED ORDER — LIDOCAINE 5 % EX PTCH
1.0000 | MEDICATED_PATCH | CUTANEOUS | Status: DC
  Administered 2024-04-15: 1 via TRANSDERMAL
  Filled 2024-04-15: qty 1

## 2024-04-15 MED ORDER — POTASSIUM CHLORIDE CRYS ER 20 MEQ PO TBCR: 20.0000 meq | EXTENDED_RELEASE_TABLET | Freq: Two times a day (BID) | ORAL | 0 refills | Status: AC

## 2024-04-15 NOTE — ED Triage Notes (Addendum)
 C?O left flank pain x 1 d ay and headache x 3 weeks.  AAOx3. Skin warm and dry. NAD.  States has history of HTN, out of BP meds x 2 weeks
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
## 2024-04-15 NOTE — ED Notes (Signed)
 Patient c/o 2/10 headache. Patient states he has been out of his blood pressure medication for approximately 1 week.

## 2024-04-15 NOTE — ED Notes (Signed)
 Patient taken to CT scan.

## 2024-04-15 NOTE — Discharge Instructions (Addendum)
 Please call your cardiologist or your primary care doctor to make a follow-up appointment in order to have your blood pressure rechecked.  I am restarting you on your blood pressure medications and I have prescribed some lidocaine  patches to help with your flank pain.  He should return to the ER if develop fevers, chest pain or shortness of breath.   IMPRESSION: 1. No acute findings in the chest, abdomen or pelvis. No evidence of pulmonary embolism. 2. Borderline cardiomegaly. Minimal atherosclerotic coronary artery disease considered age advanced coronary artery disease. 3. Subcentimeter left renal cyst. No follow-up imaging recommended. 4. 3 mm subpleural nodule over the right middle lobe unchanged from 2023 and therefore considered benign.
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
## 2024-07-09 ENCOUNTER — Telehealth: Payer: Self-pay | Admitting: Adult Health
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
## 2024-07-12 ENCOUNTER — Ambulatory Visit: Admitting: Adult Health
